# Patient Record
Sex: Female | Born: 1975 | Race: White | Hispanic: No | Marital: Married | Smoking: Never smoker
Health system: Southern US, Community
[De-identification: ages and names within clinical notes are randomized; demographics above are authoritative.]

## PROBLEM LIST (undated history)

## (undated) DIAGNOSIS — M1990 Unspecified osteoarthritis, unspecified site: Secondary | ICD-10-CM

## (undated) DIAGNOSIS — R002 Palpitations: Secondary | ICD-10-CM

## (undated) DIAGNOSIS — I10 Essential (primary) hypertension: Secondary | ICD-10-CM

## (undated) DIAGNOSIS — K219 Gastro-esophageal reflux disease without esophagitis: Secondary | ICD-10-CM

## (undated) DIAGNOSIS — F419 Anxiety disorder, unspecified: Secondary | ICD-10-CM

## (undated) HISTORY — DX: Unspecified osteoarthritis, unspecified site: M19.90

## (undated) HISTORY — PX: IUD REMOVAL: SHX5392

## (undated) HISTORY — DX: Gastro-esophageal reflux disease without esophagitis: K21.9

## (undated) HISTORY — DX: Palpitations: R00.2

## (undated) HISTORY — DX: Essential (primary) hypertension: I10

## (undated) HISTORY — DX: Anxiety disorder, unspecified: F41.9

---

## 2009-12-06 HISTORY — PX: INTRAUTERINE DEVICE INSERTION: SHX323

## 2009-12-06 HISTORY — PX: OTHER SURGICAL HISTORY: SHX169

## 2011-07-28 ENCOUNTER — Inpatient Hospital Stay (INDEPENDENT_AMBULATORY_CARE_PROVIDER_SITE_OTHER)
Admission: RE | Admit: 2011-07-28 | Discharge: 2011-07-28 | Disposition: A | Discharge status: Home / Self Care | Payer: 59 | Source: Ambulatory Visit | Attending: Family Medicine | Admitting: Family Medicine

## 2011-07-28 DIAGNOSIS — J019 Acute sinusitis, unspecified: Secondary | ICD-10-CM

## 2012-06-16 ENCOUNTER — Encounter: Payer: Self-pay | Admitting: Internal Medicine

## 2012-06-16 ENCOUNTER — Ambulatory Visit (INDEPENDENT_AMBULATORY_CARE_PROVIDER_SITE_OTHER): Payer: 59 | Admitting: Internal Medicine

## 2012-06-16 VITALS — BP 122/76 | HR 62 | Temp 98.6°F | Ht 69.0 in | Wt 178.4 lb

## 2012-06-16 DIAGNOSIS — F419 Anxiety disorder, unspecified: Secondary | ICD-10-CM

## 2012-06-16 DIAGNOSIS — Z23 Encounter for immunization: Secondary | ICD-10-CM

## 2012-06-16 DIAGNOSIS — F411 Generalized anxiety disorder: Secondary | ICD-10-CM

## 2012-06-16 DIAGNOSIS — Z0000 Encounter for general adult medical examination without abnormal findings: Secondary | ICD-10-CM

## 2012-06-16 NOTE — Patient Instructions (Signed)
It was good to see you today. Health Maintenance reviewed - all recommended immunizations and age-appropriate screenings are up-to-date. Test(s) ordered today. Your results will be called to you after review (48-72hours after test completion). If any changes need to be made, you will be notified at that time. we'll make referral to [facility] for counseling . Our office will contact you regarding appointment(s) once made. Consider Paxil for anxiety symptoms Please schedule followup in 6 weeks for review, call sooner if problems.

## 2012-06-16 NOTE — Assessment & Plan Note (Signed)
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

## 2012-06-16 NOTE — Progress Notes (Signed)
  Subjective:    Patient ID: Shelby [name], female    DOB: November 22, 1975, 36 y.o.   MRN: [id number]  HPI New patient to me and our practice, here today to establish care patient is here today for annual physical. Patient feels well overall - will return for baseline labs  Also concerned about anxiety symptoms Ongoing symptoms greater than 12 months, worse in past weeks Associated with tightness of chest sensation, difficulty swallowing and emotional liability/frequent tearfulness Initially felt symptoms related to GERD and treated with PPI but unimproved History of same during other stressful periods but never on psychotropic medication or counseling Denies suicidal ideation or homicidal ideation Denies difficulty sleeping Symptoms largely precipitated by stress - strained relationship with mother and ongoing stressors of raising small children - 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 GERD (gastroesophageal reflux disease)    Family History  Problem Relation Age of Onset  . Hyperlipidemia Mother   . Hypertension Mother   . Hypertension Father   . Bipolar disorder Sister     x 2  . Depression Mother    History  Substance Use Topics  . Smoking status: Never Smoker   . Smokeless tobacco: Not on file   Comment: married, lives with spouse (GI doc at [place]) and 2 kids - previous HS history teacher x 66yr  . Alcohol Use: Yes     Review of Systems Constitutional: Negative for fever or weight change.  Respiratory: Negative for cough and shortness of breath.   Cardiovascular: Negative for palpitations.  Gastrointestinal: Negative for abdominal pain, no bowel changes.  Musculoskeletal: Negative for gait problem or joint swelling.  Skin: Negative for  rash.  Neurological: Negative for dizziness or headache.  No other specific complaints in a complete review of systems (except as listed in HPI above).     Objective:   Physical Exam BP 122/76  Pulse 62  Temp 98.6 F (37 C) (Oral)  Ht 5\' 9"  (1.753 m)  Wt 178 lb 6.4 oz (80.922 kg)  BMI 26.35 kg/m2  SpO2 99% Wt Readings from Last 3 Encounters:  06/16/12 178 lb 6.4 oz (80.922 kg)   Constitutional: She appears well-developed and well-nourished. Tearful at times. Episode of "choking" witnessed while discussing her stressors -  Neck: Normal range of motion. Neck supple. No JVD present. No thyromegaly present.  Cardiovascular: Normal rate, regular rhythm and normal heart sounds.  No murmur heard. No BLE edema. Pulmonary/Chest: Effort normal and breath sounds normal. No respiratory distress. She has no wheezes. Neurological: She is alert and oriented to person, place, and time. No cranial nerve deficit. Coordination normal.  Skin: Skin is warm and dry. No rash noted. No erythema.  Psychiatric: She has a dysphoric and anxious mood/affect. Her behavior is normal. Judgment, insight and thought content normal.   No results found for this basename: WBC, HGB, HCT, PLT, GLUCOSE, CHOL, TRIG, HDL, LDLDIRECT, LDLCALC, ALT, AST, NA, K, CL, CREATININE, BUN, CO2, TSH, PSA, INR, GLUF, HGBA1C, MICROALBUR       Assessment & Plan:  CPX/v70.0 - Patient has been counseled on age-appropriate routine health concerns for screening and prevention. These are reviewed and up-to-date. Immunizations are up-to-date or declined. Labs ordered and will be reviewed.  Also See problem list. Medications and labs reviewed today.

## 2012-07-28 ENCOUNTER — Other Ambulatory Visit (INDEPENDENT_AMBULATORY_CARE_PROVIDER_SITE_OTHER): Payer: 59

## 2012-07-28 DIAGNOSIS — Z0000 Encounter for general adult medical examination without abnormal findings: Secondary | ICD-10-CM

## 2012-07-28 LAB — HEPATIC FUNCTION PANEL
Albumin: 3.9 g/dL (ref 3.5–5.2)
Alkaline Phosphatase: 37 U/L — ABNORMAL LOW (ref 39–117)
Bilirubin, Direct: 0.2 mg/dL (ref 0.0–0.3)
Total Bilirubin: 1.2 mg/dL (ref 0.3–1.2)

## 2012-07-28 LAB — CBC WITH DIFFERENTIAL/PLATELET
Basophils Relative: 1.3 % (ref 0.0–3.0)
Eosinophils Absolute: 0.2 10*3/uL (ref 0.0–0.7)
MCHC: 33.7 g/dL (ref 30.0–36.0)
MCV: 95.2 fl (ref 78.0–100.0)
Monocytes Absolute: 0.4 10*3/uL (ref 0.1–1.0)
Neutro Abs: 2.6 10*3/uL (ref 1.4–7.7)
Neutrophils Relative %: 54.5 % (ref 43.0–77.0)
RBC: 4.26 Mil/uL (ref 3.87–5.11)

## 2012-07-28 LAB — URINALYSIS, ROUTINE W REFLEX MICROSCOPIC
Bilirubin Urine: NEGATIVE
Ketones, ur: NEGATIVE
Total Protein, Urine: NEGATIVE
Urine Glucose: NEGATIVE

## 2012-07-28 LAB — LIPID PANEL
LDL Cholesterol: 69 mg/dL (ref 0–99)
Total CHOL/HDL Ratio: 2
Triglycerides: 46 mg/dL (ref 0.0–149.0)

## 2012-07-28 LAB — BASIC METABOLIC PANEL
CO2: 29 mEq/L (ref 19–32)
Chloride: 105 mEq/L (ref 96–112)
Creatinine, Ser: 0.6 mg/dL (ref 0.4–1.2)

## 2012-07-30 ENCOUNTER — Encounter: Payer: Self-pay | Admitting: Internal Medicine

## 2012-07-30 ENCOUNTER — Ambulatory Visit (INDEPENDENT_AMBULATORY_CARE_PROVIDER_SITE_OTHER): Payer: 59 | Admitting: Internal Medicine

## 2012-07-30 VITALS — BP 130/82 | HR 69 | Temp 98.0°F | Ht 69.0 in | Wt 178.0 lb

## 2012-07-30 DIAGNOSIS — F411 Generalized anxiety disorder: Secondary | ICD-10-CM

## 2012-07-30 DIAGNOSIS — M542 Cervicalgia: Secondary | ICD-10-CM

## 2012-07-30 DIAGNOSIS — F419 Anxiety disorder, unspecified: Secondary | ICD-10-CM

## 2012-07-30 DIAGNOSIS — J309 Allergic rhinitis, unspecified: Secondary | ICD-10-CM

## 2012-07-30 MED ORDER — LORATADINE 10 MG PO TABS: 10.0000 mg | ORAL_TABLET | Freq: Every day | ORAL | Status: DC | PRN

## 2012-07-30 NOTE — Progress Notes (Signed)
  Subjective:    Patient ID: Shelby [name], female    DOB: 26-Jan-1976, 36 y.o.   MRN: [id number]  Anxiety     Here for follow up - anxiety symptoms Ongoing symptoms greater than 12 months symptoms associated with tightness of chest sensation, difficulty swallowing and emotional liability/frequent tearfulness -all have improved with counseling therapy History of same during other stressful periods but never on psychotropic medication  counseling begun September 2013, feels improved Denies suicidal ideation or homicidal ideation Denies difficulty sleeping Symptoms largely precipitated by stress - strained relationship with mother and ongoing stressors of raising small children - 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 Tender to touch. Denies fever Associated with increased allergy symptoms including clogged ear sensation and postnasal drip  Past Medical History  Diagnosis Date  . GERD (gastroesophageal reflux disease)   . Anxiety      Review of Systems  Constitutional: Negative for fever or weight change.  Respiratory: Negative for cough and shortness of breath.   Cardiovascular: Negative for palpitations.      Objective:   Physical Exam  BP 130/82  Pulse 69  Temp 98 F (36.7 C) (Oral)  Ht 5\' 9"  (1.753 m)  Wt 178 lb (80.74 kg)  BMI 26.29 kg/m2  SpO2 99%  LMP 07/08/2012 Wt Readings from Last 3 Encounters:  07/30/12 178 lb (80.74 kg)  06/16/12 178 lb 6.4 oz (80.922 kg)   Constitutional: She appears well-developed and well-nourished.  HENT: Normocephalic atraumatic. Sinuses nontender. Ears with clear effusion, no erythema. No cerumen obstruction. Oropharynx clear, scant PND. No exudate - Neck: Tender to  palpation along right sternocleidomastoid -full range of motion, supple. No discrete lymphadenopathy. No thyromegaly or nodules appreciated. Cardiovascular: Normal rate, regular rhythm and normal heart sounds.  No murmur heard. No BLE edema. Pulmonary/Chest: Effort normal and breath sounds normal. No respiratory distress. She has no wheezes. Psychiatric: She has a much brighter mood/affect. Her behavior is normal. Judgment, insight and thought content normal.   Lab Results  Component Value Date   WBC 4.8 07/28/2012   HGB 13.7 07/28/2012   HCT 40.5 07/28/2012   PLT 203.0 07/28/2012   GLUCOSE 86 07/28/2012   CHOL 135 07/28/2012   TRIG 46.0 07/28/2012   HDL 56.60 07/28/2012   LDLCALC 69 07/28/2012   ALT 13 07/28/2012   AST 18 07/28/2012   NA 139 07/28/2012   K 4.6 07/28/2012   CL 105 07/28/2012   CREATININE 0.6 07/28/2012   BUN 11 07/28/2012   CO2 29 07/28/2012   TSH 1.61 07/28/2012      Assessment & Plan:   See problem list. Medications and labs reviewed today.  Right-sided throat pain, associated with soft tissue swelling and with ondynphasia,  no discrete lymphadenopathy or evidence of tonsillar abscess on exam - afebrile and normal white count. Check soft tissue ultrasound, consider musculoskeletal spasm. Continue symptomatically and with over-the-counter ibuprofen and acetaminophen until further imaging complete

## 2012-07-30 NOTE — Assessment & Plan Note (Signed)
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

## 2012-07-30 NOTE — Patient Instructions (Signed)
It was good to see you today. We have reviewed your prior records including labs and tests today - CPX labs are great! we'll make referral for soft tissue ultrasound of your neck . Our office will contact you regarding appointment(s) once made. Your results will be released to MyChart (and/or called to you) after review, usually within 24 hours after test completion. If any changes need to be made, you will be notified at that same time. We'll continue counseling only therapy for your anxiety symptoms at this time. Please call if you or Misty Stanley feel there is a need to try medication therapy Start Claritin daily for allergy symptoms as needed Please schedule followup in 6-12 months, call sooner if problems.

## 2012-07-31 ENCOUNTER — Ambulatory Visit
Admission: RE | Admit: 2012-07-31 | Discharge: 2012-07-31 | Disposition: A | Discharge status: Home / Self Care | Payer: 59 | Source: Ambulatory Visit | Attending: Internal Medicine | Admitting: Internal Medicine

## 2012-07-31 DIAGNOSIS — M542 Cervicalgia: Secondary | ICD-10-CM

## 2013-08-17 ENCOUNTER — Ambulatory Visit: Payer: 59 | Admitting: Physician Assistant

## 2013-08-17 ENCOUNTER — Other Ambulatory Visit (INDEPENDENT_AMBULATORY_CARE_PROVIDER_SITE_OTHER): Payer: 59 | Admitting: Gastroenterology

## 2013-08-17 DIAGNOSIS — Z23 Encounter for immunization: Secondary | ICD-10-CM

## 2013-12-26 ENCOUNTER — Other Ambulatory Visit: Payer: Self-pay | Admitting: Gastroenterology

## 2013-12-26 MED ORDER — ONDANSETRON 4 MG PO TBDP: 4.0000 mg | ORAL_TABLET | Freq: Four times a day (QID) | ORAL | Status: DC | PRN

## 2013-12-26 MED ORDER — HYOSCYAMINE SULFATE 0.125 MG SL SUBL: 0.1250 mg | SUBLINGUAL_TABLET | SUBLINGUAL | Status: DC | PRN

## 2014-07-08 ENCOUNTER — Ambulatory Visit (INDEPENDENT_AMBULATORY_CARE_PROVIDER_SITE_OTHER): Payer: 59 | Admitting: Physician Assistant

## 2014-07-08 ENCOUNTER — Encounter: Payer: Self-pay | Admitting: Physician Assistant

## 2014-07-08 VITALS — BP 120/80 | HR 72 | Temp 98.2°F | Resp 18 | Wt 185.0 lb

## 2014-07-08 DIAGNOSIS — J019 Acute sinusitis, unspecified: Secondary | ICD-10-CM

## 2014-07-08 MED ORDER — AMOXICILLIN-POT CLAVULANATE 875-125 MG PO TABS: 1.0000 | ORAL_TABLET | Freq: Two times a day (BID) | ORAL | Status: DC

## 2014-07-08 NOTE — Progress Notes (Signed)
Pre visit review using our clinic review tool, if applicable. No additional management support is needed unless otherwise documented below in the visit note. 

## 2014-07-08 NOTE — Patient Instructions (Addendum)
Plain Over the Counter Mucinex (NOT Mucinex D) for thick secretions  Force NON dairy fluids, drinking plenty of water is best.    Over the Counter Flonase OR Nasacort AQ 1 spray in each nostril twice a day as needed. Use the "crossover" technique into opposite nostril spraying toward opposite ear @ 45 degree angle, not straight up into nostril.   Plain Over the Counter Allegra (NOT D )  160 daily , OR Loratidine 10 mg , OR Zyrtec 10 mg @ bedtime  as needed for itchy eyes & sneezing.  Saline Irrigation and Saline Sprays can also help reduce symptoms.  Stop using the Afrin after today, as this will be day 3 ..  If you feel as though you are still not improving this weekend despite the above symptomatic treatment, you can fill the prescription I provided. Augmentin, twice daily for 10 days.  If emergency symptoms discussed during visit developed, seek medical attention immediately.  Followup as needed, or for worsening or persistent symptoms despite treatment.     Sinusitis Sinusitis is redness, soreness, and puffiness (inflammation) of the air pockets in the bones of your face (sinuses). The redness, soreness, and puffiness can cause air and mucus to get trapped in your sinuses. This can allow germs to grow and cause an infection.  HOME CARE   Drink enough fluids to keep your pee (urine) clear or pale yellow.  Use a humidifier in your home.  Run a hot shower to create steam in the bathroom. Sit in the bathroom with the door closed. Breathe in the steam 3-4 times a day.  Put a warm, moist washcloth on your face 3-4 times a day, or as told by your doctor.  Use salt water sprays (saline sprays) to wet the thick fluid in your nose. This can help the sinuses drain.  Only take medicine as told by your doctor. GET HELP RIGHT AWAY IF:   Your pain gets worse.  You have very bad headaches.  You are sick to your stomach (nauseous).  You throw up (vomit).  You are very sleepy (drowsy)  all the time.  Your face is puffy (swollen).  Your vision changes.  You have a stiff neck.  You have trouble breathing. MAKE SURE YOU:   Understand these instructions.  Will watch your condition.  Will get help right away if you are not doing well or get worse. Document Released: 03/12/2008 Document Revised: 06/18/2012 Document Reviewed: 04/29/2012 [facility] Patient Information 2015 [place], [place]. This information is not intended to replace advice given to you by your health care provider. Make sure you discuss any questions you have with your health care provider.

## 2014-07-08 NOTE — Progress Notes (Signed)
Subjective:    Patient ID: Shelby [name], female    DOB: [birth year], 38 y.o.   MRN: [id number]  Sinusitis This is a new problem. The current episode started in the past 7 days (8 days). The problem has been gradually worsening since onset. There has been no fever. Her pain is at a severity of 5/10. Associated symptoms include congestion, coughing, headaches, a hoarse voice, sinus pressure (pain in face) and a sore throat. Pertinent negatives include no chills, diaphoresis, ear pain, neck pain, shortness of breath, sneezing or swollen glands. Treatments tried: Afrin, mucinex, ibuprofen, claritin. The treatment provided mild relief.      Review of Systems  Constitutional: Negative for fever, chills and diaphoresis.  HENT: Positive for congestion, hoarse voice, sinus pressure (pain in face) and sore throat. Negative for ear pain and sneezing.   Respiratory: Positive for cough. Negative for shortness of breath.   Cardiovascular: Negative for chest pain.  Gastrointestinal: Positive for diarrhea (earlier in the week, has resolved.). Negative for nausea and vomiting.  Musculoskeletal: Negative for neck pain.  Neurological: Positive for headaches. Negative for syncope.  All other systems reviewed and are negative.    Past Medical History  Diagnosis Date  . GERD (gastroesophageal reflux disease)   . Anxiety     History   Social History  . Marital Status: Married    Spouse Name: N/A    Number of Children: N/A  . Years of Education: N/A   Occupational History  . Not on file.   Social History Main Topics  . Smoking status: Never Smoker   . 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 Alcohol Use: Yes  . Drug Use: No  . Sexual Activity: Not on file   Other Topics Concern  . Not on file   Social History Narrative  . No narrative on file    Past Surgical History  Procedure Laterality  Date  . Cyst remove  12/2009  . Intrauterine device insertion  12/2009    Family History  Problem Relation Age of Onset  . Hyperlipidemia Mother   . Hypertension Mother   . Hypertension Father   . Bipolar disorder Sister     x 2  . Depression Mother     No Known Allergies  Current Outpatient Prescriptions on File Prior to Visit  Medication Sig Dispense Refill  . hyoscyamine (LEVSIN SL) 0.125 MG SL tablet Place 1 tablet (0.125 mg total) under the tongue every 4 (four) hours as needed.  30 tablet  1  . levonorgestrel (MIRENA) 20 MCG/24HR IUD 1 Intra Uterine Device (1 each total) by Intrauterine route once.  1 each  0  . loratadine (CLARITIN) 10 MG tablet Take 1 tablet (10 mg total) by mouth daily as needed for allergies.  30 tablet  2  . ondansetron (ZOFRAN ODT) 4 MG disintegrating tablet Take 1 tablet (4 mg total) by mouth every 6 (six) hours as needed for nausea or vomiting.  30 tablet  1   No current facility-administered medications on file prior to visit.    EXAM: BP 120/80  Pulse 72  Temp(Src) 98.2 F (36.8 C) (Oral)  Resp 18  Wt 185 lb (83.915 kg)     Objective:   Physical Exam  Nursing note and vitals reviewed. Constitutional: She is oriented to person, place, and time. She appears well-developed and  well-nourished. No distress.  HENT:  Head: Normocephalic and atraumatic.  Right Ear: External ear normal.  Left Ear: External ear normal.  Nose: Nose normal.  Mouth/Throat: No oropharyngeal exudate.  Oropharynx is slightly erythematous, no exudate. Bilateral TMs normal. Bilateral frontal sinuses non-TTP. Bilat max sinuses ttp.  Eyes: Conjunctivae and EOM are normal. Pupils are equal, round, and reactive to light.  Neck: Normal range of motion. Neck supple.  Cardiovascular: Normal rate, regular rhythm and intact distal pulses.   Pulmonary/Chest: Effort normal and breath sounds normal. No stridor. No respiratory distress. She has no wheezes. She has no rales. She  exhibits no tenderness.  Lymphadenopathy:    She has no cervical adenopathy.  Neurological: She is alert and oriented to person, place, and time.  Skin: Skin is warm and dry. No rash noted. She is not diaphoretic. No erythema. No pallor.  Psychiatric: She has a normal mood and affect. Her behavior is normal. Judgment and thought content normal.     Lab Results  Component Value Date   WBC 4.8 07/28/2012   HGB 13.7 07/28/2012   HCT 40.5 07/28/2012   PLT 203.0 07/28/2012   GLUCOSE 86 07/28/2012   CHOL 135 07/28/2012   TRIG 46.0 07/28/2012   HDL 56.60 07/28/2012   LDLCALC 69 07/28/2012   ALT 13 07/28/2012   AST 18 07/28/2012   NA 139 07/28/2012   K 4.6 07/28/2012   CL 105 07/28/2012   CREATININE 0.6 07/28/2012   BUN 11 07/28/2012   CO2 29 07/28/2012   TSH 1.61 07/28/2012        Assessment & Plan:  Francille was seen today for sinusitis.  Diagnoses and associated orders for this visit:  Acute sinusitis, recurrence not specified, unspecified location Comments: Continue symptomatic treatment with OTC mucinex, nasal steroid, antihistamine, rest push fluids, watchful waiting. - amoxicillin-clavulanate (AUGMENTIN) 875-125 MG per tablet; Take 1 tablet by mouth 2 (two) times daily.    Discussed using abx with pt, and that she may not need abx at this time. The pt states that she does not want to take abx if she does not need them, however is concerned that she is still no better despite at home symptom treatment. Discussed providing a printed Rx for Augmentin for pt to fill if she is still not any better with symptomatic therapy this weekend. She is amenable to this plan.  Return precautions provided, and patient handout on sinusitis.  Plan to follow up as needed, or for worsening or persistent symptoms despite treatment.  Patient Instructions  Plain Over the Counter Mucinex (NOT Mucinex D) for thick secretions  Force NON dairy fluids, drinking plenty of water is best.    Over  the Counter Flonase OR Nasacort AQ 1 spray in each nostril twice a day as needed. Use the "crossover" technique into opposite nostril spraying toward opposite ear @ 45 degree angle, not straight up into nostril.   Plain Over the Counter Allegra (NOT D )  160 daily , OR Loratidine 10 mg , OR Zyrtec 10 mg @ bedtime  as needed for itchy eyes & sneezing.  Saline Irrigation and Saline Sprays can also help reduce symptoms.  Stop using the Afrin after today, as this will be day 3 ..  If you feel as though you are still not improving this weekend despite the above symptomatic treatment, you can fill the prescription I provided. Augmentin, twice daily for 10 days.  If emergency symptoms discussed during visit developed, seek medical attention  immediately.  Followup as needed, or for worsening or persistent symptoms despite treatment.

## 2015-03-28 ENCOUNTER — Ambulatory Visit (INDEPENDENT_AMBULATORY_CARE_PROVIDER_SITE_OTHER)
Admission: RE | Admit: 2015-03-28 | Discharge: 2015-03-28 | Disposition: A | Discharge status: Home / Self Care | Payer: 59 | Source: Ambulatory Visit | Attending: Family Medicine | Admitting: Family Medicine

## 2015-03-28 ENCOUNTER — Encounter: Payer: Self-pay | Admitting: Family Medicine

## 2015-03-28 ENCOUNTER — Ambulatory Visit (INDEPENDENT_AMBULATORY_CARE_PROVIDER_SITE_OTHER): Payer: 59 | Admitting: Family Medicine

## 2015-03-28 VITALS — BP 122/76 | HR 73 | Ht 69.0 in

## 2015-03-28 DIAGNOSIS — M549 Dorsalgia, unspecified: Secondary | ICD-10-CM

## 2015-03-28 DIAGNOSIS — M5416 Radiculopathy, lumbar region: Secondary | ICD-10-CM

## 2015-03-28 MED ORDER — GABAPENTIN 100 MG PO CAPS: 100.0000 mg | ORAL_CAPSULE | Freq: Every day | ORAL | Status: DC

## 2015-03-28 MED ORDER — KETOROLAC TROMETHAMINE 60 MG/2ML IM SOLN
60.0000 mg | Freq: Once | INTRAMUSCULAR | Status: AC
  Administered 2015-03-28: 60 mg via INTRAMUSCULAR

## 2015-03-28 MED ORDER — PREDNISONE 50 MG PO TABS: 50.0000 mg | ORAL_TABLET | Freq: Every day | ORAL | Status: DC

## 2015-03-28 MED ORDER — METHYLPREDNISOLONE ACETATE 80 MG/ML IJ SUSP
80.0000 mg | Freq: Once | INTRAMUSCULAR | Status: AC
  Administered 2015-03-28: 80 mg via INTRAMUSCULAR

## 2015-03-28 MED ORDER — TRAMADOL HCL 50 MG PO TABS: 50.0000 mg | ORAL_TABLET | Freq: Two times a day (BID) | ORAL | Status: DC | PRN

## 2015-03-28 NOTE — Progress Notes (Signed)
Pre visit review using our clinic review tool, if applicable. No additional management support is needed unless otherwise documented below in the visit note. 

## 2015-03-28 NOTE — Patient Instructions (Addendum)
Good to see you.  Great to meet Jay's better half.  2 injections today Ice 20 minutes 2 times daily. Usually after activity and before bed. Prednisone 50 mg daily for 5 days Gabapentin 100mg  nightly Avoid excessive flexion or overhead lifting.  See you next week.

## 2015-03-28 NOTE — Assessment & Plan Note (Signed)
She does have an L5-S1 nerve roots impingement on the right side. We discussed different treatment options and patient is coming like to try conservative therapy. Patient given prednisone, gabapentin, tramadol for breakthrough pain. Patient has muscle relaxer if needed. We discussed icing regimen. We will hold on home exercises until patient follows up again in 1 week for further evaluation and treatment.

## 2015-03-28 NOTE — Progress Notes (Signed)
[name] [place], [place] [address] Phone: [phone] Subjective:    I'm seeing this patient by the request  of:  Gwendolyn Grant, MD  CC: back pain.   Shelby [name] is a 39 y.o. female coming in with complaint of back pain. Patient states that this has been more back pain. Patient started running back in February and has had intermittent times where she has spasming of her low back. Patient states that unfortunately starting 48 hours ago she said severe pain. Patient severe pain seemed to have some mild radiation down the posterior aspect of her leg. Some mild numbness on the left lateral toes as well. Patient describes the pain as a unrelenting pain that seems to be worse with going from a seated to standing position and laid into a sitting position. Patient rates the severity of pain is 10 out of 10. Denies any bowel or bladder incontinence medical history on a pot secondary to the pain. Patient denies any weakness of the lower extremity. Patient has tried taking a muscle relaxer without any significant benefit. Patient states the muscle relaxer use to help. Patient is also taking the ibuprofen 800 mg previously.     Past Medical History  Diagnosis Date  . GERD (gastroesophageal reflux disease)   . Anxiety    Past Surgical History  Procedure Laterality Date  . Cyst remove  12/2009  . Intrauterine device insertion  12/2009   History  Substance Use Topics  . Smoking status: Never Smoker   . 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 Alcohol Use: Yes   No Known Allergies Family History  Problem Relation Age of Onset  . Hyperlipidemia Mother   . Hypertension Mother   . Hypertension Father   . Bipolar disorder Sister     x 2  . Depression Mother    '  Past medical history, social, surgical and family history all reviewed  in electronic medical record.   Review of Systems: No headache, visual changes, nausea, vomiting, diarrhea, constipation, dizziness, abdominal pain, skin rash, fevers, chills, night sweats, weight loss, swollen lymph nodes, body aches, joint swelling, muscle aches, chest pain, shortness of breath, mood changes.   Objective Blood pressure 122/76, pulse 73, height 5\' 9"  (1.753 m), SpO2 94 %.  General: patient is very uncomfortable HEENT: Pupils equal, extraocular movements intact  Respiratory: Patient's speak in full sentences and does not appear short of breath  Cardiovascular: No lower extremity edema, non tender, no erythema  Skin: Warm dry intact with no signs of infection or rash on extremities or on axial skeleton.  Abdomen: Soft nontender  Neuro: Cranial nerves II through XII are intact, neurovascularly intact in all extremities with 2+ DTRs and 2+ pulses.  Lymph: No lymphadenopathy of posterior or anterior cervical chain or axillae bilaterally.  Gait normal with good balance and coordination.  MSK:  Non tender with full range of motion and good stability and symmetric strength and tone of shoulders, elbows, wrist, hip, knee and ankles bilaterally.  Back Exam:  Inspection: Unremarkable  Motion: Flexion 25 deg, Extension 25 deg, Side Bending to 35 deg bilaterally,  Rotation to 45 deg bilaterally  SLR laying: POSITIVE RIGHTL5-S1 nerve root distribution XSLR laying: POSITIVE RIGHT Palpable tenderness: TENDERNESS OVER THE l5-s1 AREA ON RIGHT SIDE MILD TENDERNESS OVER PIRIFORMIS. FABER:  negative. Sensory change: Gross sensation intact to all lumbar and sacral dermatomes.  Reflexes: 2+ at both patellar tendons, 2+ at achilles tendons, Babinski's downgoing.  Strength at foot  Plantar-flexion: 5/5 Dorsi-flexion: 5/5 Eversion: 5/5 Inversion: 5/5  Leg strength  Quad: 4/5 Hamstring: 4/5 Hip flexor: 4/5 Hip abductors: 3/5  Gait unremarkable.    Impression and Recommendations:     This  case required medical decision making of moderate complexity.

## 2015-03-28 NOTE — Addendum Note (Signed)
Addended by: Douglass Rivers T on: 03/28/2015 01:50 PM   Modules accepted: Orders

## 2015-04-04 ENCOUNTER — Encounter: Payer: Self-pay | Admitting: Family Medicine

## 2015-04-04 ENCOUNTER — Ambulatory Visit (INDEPENDENT_AMBULATORY_CARE_PROVIDER_SITE_OTHER): Payer: 59 | Admitting: Family Medicine

## 2015-04-04 VITALS — BP 102/74 | HR 67 | Ht 69.0 in | Wt 183.0 lb

## 2015-04-04 DIAGNOSIS — M5416 Radiculopathy, lumbar region: Secondary | ICD-10-CM

## 2015-04-04 NOTE — Patient Instructions (Signed)
Good to see you Ice 20 minutes 2 times daily. Usually after activity and before bed. New exercises Ibuprofen 600mg  3 times a day for 3 days Yoga 1-2 times a week.  Husband has to do all chores including laundry :) See me again in 2 weeks and if not perfect lets consider manipulation.

## 2015-04-04 NOTE — Assessment & Plan Note (Signed)
Patient is slowly improving at this time. Encourage her to start the home exercises and patient did work with Product/process development scientist today. We discussed icing regimen. Patient will continue most of the same regimen and increase her activity as tolerated. Patient and will come back and see me again in 3 weeks. May want to consider osteopathic manipulation the patient declined today.

## 2015-04-04 NOTE — Progress Notes (Signed)
Pre visit review using our clinic review tool, if applicable. No additional management support is needed unless otherwise documented below in the visit note. 

## 2015-04-04 NOTE — Progress Notes (Signed)
[name] [name], [place] [address] Phone: [phone] Subjective:    I'm seeing this patient by the request  of:  Gwendolyn Grant, MD  CC: back pain follow-up.   [id number] [place] [name] is a 39 y.o. female coming in with complaint of back pain. Patient was seen previously and had a concern for herniated disc of the lumbar spine. Patient was given prednisone, gabapentin, as well as tramadol for breakthrough pain. Patient went to the gabapentin 1 time but did take the prednisone. Patient has not taken any anti-inflammatory's and has not taken any of them pain medication. Patient states that she is 80% better but still has soreness. Patient states that it is still slowing her down somewhat but not as severe as what it was previously. Improvement.     Past Medical History  Diagnosis Date  . GERD (gastroesophageal reflux disease)   . Anxiety    Past Surgical History  Procedure Laterality Date  . Cyst remove  12/2009  . Intrauterine device insertion  12/2009   History  Substance Use Topics  . Smoking status: Never Smoker   . Smokeless tobacco: Not on file     Comment: married, lives with spouse (GI doc at [facility]) and 2 kids - previous HS history teacher x 23yr  . Alcohol Use: Yes   No Known Allergies Family History  Problem Relation Age of Onset  . Hyperlipidemia Mother   . Hypertension Mother   . Hypertension Father   . Bipolar disorder Sister     x 2  . Depression Mother    '  Past medical history, social, surgical and family history all reviewed in electronic medical record.   Review of Systems: No headache, visual changes, nausea, vomiting, diarrhea, constipation, dizziness, abdominal pain, skin rash, fevers, chills, night sweats, weight loss, swollen lymph nodes, body aches, joint swelling, muscle aches, chest pain, shortness of breath, mood changes.   Objective Blood pressure 102/74, pulse 67, height 5\' 9"  (1.753 m),  weight 183 lb (83.008 kg), last menstrual period 03/14/2015, SpO2 98 %.  General: patient is very uncomfortable HEENT: Pupils equal, extraocular movements intact  Respiratory: Patient's speak in full sentences and does not appear short of breath  Cardiovascular: No lower extremity edema, non tender, no erythema  Skin: Warm dry intact with no signs of infection or rash on extremities or on axial skeleton.  Abdomen: Soft nontender  Neuro: Cranial nerves II through XII are intact, neurovascularly intact in all extremities with 2+ DTRs and 2+ pulses.  Lymph: No lymphadenopathy of posterior or anterior cervical chain or axillae bilaterally.  Gait normal with good balance and coordination.  MSK:  Non tender with full range of motion and good stability and symmetric strength and tone of shoulders, elbows, wrist, hip, knee and ankles bilaterally.  Back Exam:  Inspection: Unremarkable  Motion: Flexion 25 deg, Extension 25 deg, Side Bending to 35 deg bilaterally,  Rotation to 45 deg bilaterally  SLR laying: Negative XSLR laying: Negative Palpable tenderness: TENDERNESS OVER THE l5-s1 AREA ON RIGHT SIDE MILD TENDERNESS OVER PIRIFORMIS still present FABER: negative. Sensory change: Gross sensation intact to all lumbar and sacral dermatomes.  Reflexes: 2+ at both patellar tendons, 2+ at achilles tendons, Babinski's downgoing.  Strength at foot  Plantar-flexion: 5/5 Dorsi-flexion: 5/5 Eversion: 5/5 Inversion: 5/5  Leg strength  Quad: 4/5 Hamstring: 4/5 Hip flexor: 4/5 Hip abductors: 3/5  Gait unremarkable.  Procedure note 16010; 15 minutes spent for  Therapeutic exercises as stated in above notes.  This included exercises focusing on stretching, strengthening, with significant focus on eccentric aspects.  Low back exercises that included:  Pelvic tilt/bracing instruction to focus on control of the pelvic girdle and lower abdominal muscles  Glute strengthening exercises, focusing on proper firing of  the glutes without engaging the low back muscles Proper stretching techniques for maximum relief for the hamstrings, hip flexors, low back and some rotation where tolerated Proper technique shown and discussed handout in great detail with ATC.  All questions were discussed and answered.     Impression and Recommendations:     This case required medical decision making of moderate complexity.

## 2015-04-18 ENCOUNTER — Ambulatory Visit (INDEPENDENT_AMBULATORY_CARE_PROVIDER_SITE_OTHER): Payer: 59 | Admitting: Family Medicine

## 2015-04-18 ENCOUNTER — Encounter: Payer: Self-pay | Admitting: Family Medicine

## 2015-04-18 VITALS — BP 120/82 | HR 68 | Ht 69.0 in | Wt 183.0 lb

## 2015-04-18 DIAGNOSIS — M5416 Radiculopathy, lumbar region: Secondary | ICD-10-CM

## 2015-04-18 NOTE — Progress Notes (Signed)
[name] [place], [place] [address] Phone: [phone] Subjective:    I'm seeing this patient by the request  of:  Gwendolyn Grant, MD  CC: back pain follow-up.   [id number] [place] [name] is a 39 y.o. female coming in with complaint of back pain. Patient was seen previously and had a concern for herniated disc of the lumbar spine. Patient was given prednisone, gabapentin, as well as tramadol for breakthrough pain. Patient has not needed any anti-inflammatory that the splint is doing much better. Patient states she is doing significantly better. Patient states that she is back to her baseline. Patient has not taken any medications and is doing yoga on a regular basis.  Past Medical History  Diagnosis Date  . GERD (gastroesophageal reflux disease)   . Anxiety   t her back pain is really nonexistent at this time. Patient has     Past Medical History  Diagnosis Date  . GERD (gastroesophageal reflux disease)   . Anxiety    Past Surgical History  Procedure Laterality Date  . Cyst remove  12/2009  . Intrauterine device insertion  12/2009   History  Substance Use Topics  . Smoking status: Never Smoker   . 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 Alcohol Use: Yes   No Known Allergies Family History  Problem Relation Age of Onset  . Hyperlipidemia Mother   . Hypertension Mother   . Hypertension Father   . Bipolar disorder Sister     x 2  . Depression Mother    '  Past medical history, social, surgical and family history all reviewed in electronic medical record.   Review of Systems: No headache, visual changes, nausea, vomiting, diarrhea, constipation, dizziness, abdominal pain, skin rash, fevers, chills, night sweats, weight loss, swollen lymph nodes, body aches, joint swelling, muscle aches, chest pain, shortness of breath, mood  changes.   Objective Blood pressure 120/82, pulse 68, height 5\' 9"  (1.753 m), weight 183 lb (83.008 kg), last menstrual period 03/14/2015, SpO2 95 %.  General: patient is very uncomfortable HEENT: Pupils equal, extraocular movements intact  Respiratory: Patient's speak in full sentences and does not appear short of breath  Cardiovascular: No lower extremity edema, non tender, no erythema  Skin: Warm dry intact with no signs of infection or rash on extremities or on axial skeleton.  Abdomen: Soft nontender  Neuro: Cranial nerves II through XII are intact, neurovascularly intact in all extremities with 2+ DTRs and 2+ pulses.  Lymph: No lymphadenopathy of posterior or anterior cervical chain or axillae bilaterally.  Gait normal with good balance and coordination.  MSK:  Non tender with full range of motion and good stability and symmetric strength and tone of shoulders, elbows, wrist, hip, knee and ankles bilaterally.  Back Exam:  Inspection: Unremarkable  Motion: Flexion 25 deg, Extension 25 deg, Side Bending to 35 deg bilaterally,  Rotation to 45 deg bilaterally  SLR laying: Negative XSLR laying: Negative Palpable tenderness: Continued improvement with minimal's pain right. FABER: negative. Sensory change: Gross sensation intact to all lumbar and sacral dermatomes.  Reflexes: 2+ at both patellar tendons, 2+ at achilles tendons, Babinski's downgoing.  Strength at foot  Plantar-flexion: 5/5 Dorsi-flexion: 5/5 Eversion: 5/5 Inversion: 5/5  Leg strength  Quad: 4/5 Hamstring: 4/5 Hip flexor: 5/5 Hip abductors: 4/5  Gait unremarkable.  Impression and Recommendations:     This case required medical decision making of moderate complexity.

## 2015-04-18 NOTE — Patient Instructions (Signed)
Good to see you as always.  Ice when you need it.  Continue to increase activity Tennis ok 1-2 times a week then go to town Work on core strength and hip abductor strength still  See you around the neighborhood.

## 2015-04-18 NOTE — Assessment & Plan Note (Signed)
Patient's longer having an exacerbation. Patient has not taken any pain medications at this time. We discussed icing regimen as well as continuing to increase her activity. Patient was given an exercise prescription in. Patient will follow-up as needed.

## 2015-04-18 NOTE — Progress Notes (Signed)
Pre visit review using our clinic review tool, if applicable. No additional management support is needed unless otherwise documented below in the visit note. 

## 2015-06-09 ENCOUNTER — Encounter: Payer: Self-pay | Admitting: Family Medicine

## 2015-06-15 ENCOUNTER — Ambulatory Visit (INDEPENDENT_AMBULATORY_CARE_PROVIDER_SITE_OTHER): Payer: 59 | Admitting: Family Medicine

## 2015-06-15 ENCOUNTER — Encounter: Payer: Self-pay | Admitting: Family Medicine

## 2015-06-15 VITALS — BP 122/78 | HR 75 | Ht 69.0 in | Wt 186.0 lb

## 2015-06-15 DIAGNOSIS — M9902 Segmental and somatic dysfunction of thoracic region: Secondary | ICD-10-CM

## 2015-06-15 DIAGNOSIS — M9903 Segmental and somatic dysfunction of lumbar region: Secondary | ICD-10-CM

## 2015-06-15 DIAGNOSIS — M999 Biomechanical lesion, unspecified: Secondary | ICD-10-CM | POA: Insufficient documentation

## 2015-06-15 DIAGNOSIS — M5416 Radiculopathy, lumbar region: Secondary | ICD-10-CM | POA: Diagnosis not present

## 2015-06-15 DIAGNOSIS — M9904 Segmental and somatic dysfunction of sacral region: Secondary | ICD-10-CM

## 2015-06-15 NOTE — Progress Notes (Signed)
Pre visit review using our clinic review tool, if applicable. No additional management support is needed unless otherwise documented below in the visit note. 

## 2015-06-15 NOTE — Progress Notes (Signed)
[name] [place], [place] [address] Phone: [phone] Subjective:    CC: back pain follow-up.   [id number] [place] [name] is a 39 y.o. female coming in with complaint of back pain.patient did have an acute lumbar radiculopathy multiple months ago. Patient is stating that she is unfortunately having worsening pain again. States that from time to time she does have a radicular symptom but nothing that is constant. Denies any continuing numbness or weakness. Patient states though that certain activities seem to make it significantly worse. Especially pushing. Seems to be located in the lower spine but does go into the buttocks again.patient rates the severity of pain a 6 out of 10 because it is keeping him from her daily activities.  Past Medical History  Diagnosis Date  . GERD (gastroesophageal reflux disease)   . Anxiety       Past Medical History  Diagnosis Date  . GERD (gastroesophageal reflux disease)   . Anxiety    Past Surgical History  Procedure Laterality Date  . Cyst remove  12/2009  . Intrauterine device insertion  12/2009   Social History  Substance Use Topics  . Smoking status: Never Smoker   . 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 Alcohol Use: Yes   No Known Allergies Family History  Problem Relation Age of Onset  . Hyperlipidemia Mother   . Hypertension Mother   . Hypertension Father   . Bipolar disorder Sister     x 2  . Depression Mother    '  Past medical history, social, surgical and family history all reviewed in electronic medical record.   Review of Systems: No headache, visual changes, nausea, vomiting, diarrhea, constipation, dizziness, abdominal pain, skin rash, fevers, chills, night sweats, weight loss, swollen lymph nodes, body aches, joint swelling, muscle aches, chest pain, shortness of breath, mood  changes.   Objective Blood pressure 122/78, pulse 75, height 5\' 9"  (1.753 m), weight 186 lb (84.369 kg), SpO2 96 %.  General: patient is very uncomfortable HEENT: Pupils equal, extraocular movements intact  Respiratory: Patient's speak in full sentences and does not appear short of breath  Cardiovascular: No lower extremity edema, non tender, no erythema  Skin: Warm dry intact with no signs of infection or rash on extremities or on axial skeleton.  Abdomen: Soft nontender  Neuro: Cranial nerves II through XII are intact, neurovascularly intact in all extremities with 2+ DTRs and 2+ pulses.  Lymph: No lymphadenopathy of posterior or anterior cervical chain or axillae bilaterally.  Gait normal with good balance and coordination.  MSK:  Non tender with full range of motion and good stability and symmetric strength and tone of shoulders, elbows, wrist, hip, knee and ankles bilaterally.  Back Exam:  Inspection: Unremarkable  Motion: Flexion 25 deg, Extension 25 deg, Side Bending to 35 deg bilaterally,  Rotation to 45 deg bilaterally  SLR laying: Negativethe patient states that she has noticed tightness in the buttocks regionXSLR laying: Negative Palpable tenderness: negative pain in the paraspinal musculature and the right sacroiliac joint FABER: negative. Sensory change: Gross sensation intact to all lumbar and sacral dermatomes.  Reflexes: 2+ at both patellar tendons, 2+ at achilles tendons, Babinski's downgoing.  Strength at foot  Plantar-flexion: 5/5 Dorsi-flexion: 5/5 Eversion: 5/5 Inversion: 5/5  Leg strength  Quad: 5/5 Hamstring: 4/5still weak compared to previous  exam Hip flexor: 5/5 Hip abductors: 4/5  Gait unremarkable.  Osteopathic findings Sacrum right on right Lumbar L2 flexed rotated and side bent left T5 extended rotated and side bent right    Impression and Recommendations:     This case required medical decision making of moderate complexity.

## 2015-06-15 NOTE — Patient Instructions (Signed)
Good to see you Your sacrum was rotated again Exercises on wall.  Heel and butt touching.  Raise leg 6 inches and hold 2 seconds.  Down slow for count of 4 seconds.  1 set of 30 reps daily on both sides.  Continue the stretches of the piriformis 20-3 times a week.  Gabapentin at night could help Sit on tennis ball with sitting for long amount of time duexis 3 times daily for 6 days See me again in 2 weeks.

## 2015-06-15 NOTE — Assessment & Plan Note (Signed)
Decision today to treat with OMT was based on Physical Exam  After verbal consent patient was treated with HVLA, ME, FPR techniques in  thoracic, lumbar and sacral areas  Patient tolerated the procedure well with improvement in symptoms  Patient given exercises, stretches and lifestyle modifications  See medications in patient instructions if given  Patient will follow up in 2-3 weeks 

## 2015-06-15 NOTE — Assessment & Plan Note (Signed)
Concern the patient is working on possibly a new herniated disc at this time. Or recurrent herniated disc. Discussed with patient at great length. I like to avoid any prednisone. Patient given some anti-inflammatory to try as well as an icing protocol. We discussed the possibility of formal physical therapy but we will consider this in the future. Patient will continue with the home exercises.we attempted some osteopathic manipulation and patient did respond very well. Over this will be beneficial in patient may need to have this intermittently. Patient come back in 2-3 weeks for further evaluation and if any worsening symptoms advance imaging may be warranted.

## 2015-06-27 ENCOUNTER — Encounter: Payer: Self-pay | Admitting: Family Medicine

## 2015-07-04 ENCOUNTER — Encounter: Payer: Self-pay | Admitting: Family Medicine

## 2015-07-04 ENCOUNTER — Ambulatory Visit (INDEPENDENT_AMBULATORY_CARE_PROVIDER_SITE_OTHER): Payer: 59 | Admitting: Family Medicine

## 2015-07-04 VITALS — BP 122/82 | HR 54 | Ht 69.0 in | Wt 186.0 lb

## 2015-07-04 DIAGNOSIS — M9904 Segmental and somatic dysfunction of sacral region: Secondary | ICD-10-CM | POA: Diagnosis not present

## 2015-07-04 DIAGNOSIS — M9903 Segmental and somatic dysfunction of lumbar region: Secondary | ICD-10-CM

## 2015-07-04 DIAGNOSIS — M533 Sacrococcygeal disorders, not elsewhere classified: Secondary | ICD-10-CM | POA: Insufficient documentation

## 2015-07-04 DIAGNOSIS — M9902 Segmental and somatic dysfunction of thoracic region: Secondary | ICD-10-CM | POA: Diagnosis not present

## 2015-07-04 DIAGNOSIS — M999 Biomechanical lesion, unspecified: Secondary | ICD-10-CM

## 2015-07-04 NOTE — Assessment & Plan Note (Signed)
Decision today to treat with OMT was based on Physical Exam  After verbal consent patient was treated with HVLA, ME, FPR techniques in  thoracic, lumbar and sacral areas  Patient tolerated the procedure well with improvement in symptoms  Patient given exercises, stretches and lifestyle modifications  See medications in patient instructions if given  Patient will follow up in 3 weeks 

## 2015-07-04 NOTE — Progress Notes (Signed)
[name] [place], [place] [address] Phone: [phone] Subjective:    CC: back pain follow-up.   [id number] [place] [name] is a 39 y.o. female coming in with complaint of back pain.patient did have an acute lumbar radiculopathy multiple months ago. Patient is stating that she is unfortunately having worsening pain again. Patient's states that she was playing tennis he of the day and had excruciating pain than construct not be able to ambulate. This happen one week ago.  Patient did take anti-inflammatory,  states that it is improving. More tightness again around the sacroiliac joint. States that she can do all other activities. States that from time to time getting up and down seems to be a little bit uncomfortable. Patient did not have any radicular symptoms on the leg or any weakness.   Past Medical History  Diagnosis Date  . GERD (gastroesophageal reflux disease)   . Anxiety       Past Medical History  Diagnosis Date  . GERD (gastroesophageal reflux disease)   . Anxiety    Past Surgical History  Procedure Laterality Date  . Cyst remove  12/2009  . Intrauterine device insertion  12/2009   Social History  Substance Use Topics  . Smoking status: Never Smoker   . 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 Alcohol Use: Yes   No Known Allergies Family History  Problem Relation Age of Onset  . Hyperlipidemia Mother   . Hypertension Mother   . Hypertension Father   . Bipolar disorder Sister     x 2  . Depression Mother    '  Past medical history, social, surgical and family history all reviewed in electronic medical record.   Review of Systems: No headache, visual changes, nausea, vomiting, diarrhea, constipation, dizziness, abdominal pain, skin rash, fevers, chills, night sweats, weight loss, swollen lymph nodes, body aches, joint  swelling, muscle aches, chest pain, shortness of breath, mood changes.   Objective Blood pressure 122/82, pulse 54, height 5\' 9"  (1.753 m), weight 186 lb (84.369 kg), SpO2 97 %.  General: patient is very uncomfortable HEENT: Pupils equal, extraocular movements intact  Respiratory: Patient's speak in full sentences and does not appear short of breath  Cardiovascular: No lower extremity edema, non tender, no erythema  Skin: Warm dry intact with no signs of infection or rash on extremities or on axial skeleton.  Abdomen: Soft nontender  Neuro: Cranial nerves II through XII are intact, neurovascularly intact in all extremities with 2+ DTRs and 2+ pulses.  Lymph: No lymphadenopathy of posterior or anterior cervical chain or axillae bilaterally.  Gait normal with good balance and coordination.  MSK:  Non tender with full range of motion and good stability and symmetric strength and tone of shoulders, elbows, wrist, hip, knee and ankles bilaterally.  Back Exam:  Inspection: Unremarkable  Motion: Flexion 25 deg, Extension 25 deg, Side Bending to 35 deg bilaterally,  Rotation to 45 deg bilaterally  SLR laying: Negative straight leg test  XSLR laying: Negative Palpable tenderness: Continued stiffness over the right sacroiliac joint FABER: negative. Sensory change: Gross sensation intact to all lumbar and sacral dermatomes.  Reflexes: 2+ at both patellar tendons, 2+ at achilles tendons, Babinski's downgoing.  Strength at foot  Plantar-flexion: 5/5 Dorsi-flexion: 5/5 Eversion: 5/5 Inversion: 5/5  Leg strength  Quad: 5/5 Hamstring: 4/5still weak  compared to previous exam Hip flexor: 5/5 Hip abductors: 4/5  Gait unremarkable.  Osteopathic findings Sacrum right on right Lumbar L2 flexed rotated and side bent left T5 extended rotated and side bent right    Impression and Recommendations:     This case required medical decision making of moderate complexity.

## 2015-07-04 NOTE — Progress Notes (Signed)
Pre visit review using our clinic review tool, if applicable. No additional management support is needed unless otherwise documented below in the visit note. 

## 2015-07-04 NOTE — Patient Instructions (Signed)
Get back the horse.  Ice after activity  Continue on the hip abductors.  Vitamin D 2000 IU daily Turmeric 500mg  twice daily Tart cherry extract at night Lets try a message  See me again in 3 weeks.

## 2015-07-04 NOTE — Assessment & Plan Note (Signed)
She likely has more of a sacroiliac joint dysfunction. We discussed icing regimen and home exercises. We discussed which activities to do a and which ones to avoid. We discussed the importance of hip abductor strengthening. Patient will try to make these different changes and come back and see me again in 3-4 weeks. Patient given suggestions of over-the-counter natural supplements to decrease inflammation as well.

## 2015-07-25 ENCOUNTER — Ambulatory Visit (INDEPENDENT_AMBULATORY_CARE_PROVIDER_SITE_OTHER): Payer: 59 | Admitting: Family Medicine

## 2015-07-25 ENCOUNTER — Encounter: Payer: Self-pay | Admitting: Family Medicine

## 2015-07-25 VITALS — BP 112/82 | HR 72 | Ht 69.0 in | Wt 181.0 lb

## 2015-07-25 DIAGNOSIS — M9902 Segmental and somatic dysfunction of thoracic region: Secondary | ICD-10-CM

## 2015-07-25 DIAGNOSIS — M999 Biomechanical lesion, unspecified: Secondary | ICD-10-CM

## 2015-07-25 DIAGNOSIS — M9904 Segmental and somatic dysfunction of sacral region: Secondary | ICD-10-CM | POA: Diagnosis not present

## 2015-07-25 DIAGNOSIS — M9903 Segmental and somatic dysfunction of lumbar region: Secondary | ICD-10-CM

## 2015-07-25 DIAGNOSIS — M533 Sacrococcygeal disorders, not elsewhere classified: Secondary | ICD-10-CM

## 2015-07-25 NOTE — Assessment & Plan Note (Signed)
Patient is doing remarkably well at this time. Encourage her to continue the home exercises in the icing protocol. Patient is going to continue the vitamin supplementation. We discussed which activities and yoga would be beneficial and which ones to potentially avoid. We discussed continuing all other conservative therapy and see me again in 6-8 weeks for further evaluation and treatment.

## 2015-07-25 NOTE — Progress Notes (Signed)
[name] [place], [place] [address] Phone: [phone] Subjective:    CC: back pain follow-up.   [id number] Shelby [name] is a 39 y.o. female coming in with complaint of back pain.patient did have an acute lumbar radiculopathy multiple months ago.  Patient is doing much better at this time. Patient has been doing more regular exercise regimen. Has been doing yoga. Feels that her core stronger. Staying very active. Denies any radiation or any radicular symptoms. For the last 2 weeks no significant pain at all and no pain medications.   Past Medical History  Diagnosis Date  . GERD (gastroesophageal reflux disease)   . Anxiety       Past Medical History  Diagnosis Date  . GERD (gastroesophageal reflux disease)   . Anxiety    Past Surgical History  Procedure Laterality Date  . Cyst remove  12/2009  . Intrauterine device insertion  12/2009   Social History  Substance Use Topics  . Smoking status: Never Smoker   . 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 Alcohol Use: Yes   No Known Allergies Family History  Problem Relation Age of Onset  . Hyperlipidemia Mother   . Hypertension Mother   . Hypertension Father   . Bipolar disorder Sister     x 2  . Depression Mother    '  Past medical history, social, surgical and family history all reviewed in electronic medical record.   Review of Systems: No headache, visual changes, nausea, vomiting, diarrhea, constipation, dizziness, abdominal pain, skin rash, fevers, chills, night sweats, weight loss, swollen lymph nodes, body aches, joint swelling, muscle aches, chest pain, shortness of breath, mood changes.   Objective Blood pressure 112/82, pulse 72, height 5\' 9"  (1.753 m), weight 181 lb (82.101 kg), SpO2 98 %.  General: patient is very uncomfortable HEENT: Pupils equal, extraocular movements  intact  Respiratory: Patient's speak in full sentences and does not appear short of breath  Cardiovascular: No lower extremity edema, non tender, no erythema  Skin: Warm dry intact with no signs of infection or rash on extremities or on axial skeleton.  Abdomen: Soft nontender  Neuro: Cranial nerves II through XII are intact, neurovascularly intact in all extremities with 2+ DTRs and 2+ pulses.  Lymph: No lymphadenopathy of posterior or anterior cervical chain or axillae bilaterally.  Gait normal with good balance and coordination.  MSK:  Non tender with full range of motion and good stability and symmetric strength and tone of shoulders, elbows, wrist, hip, knee and ankles bilaterally.  Back Exam:  Inspection: Unremarkable  Motion: Flexion 35 deg which is an improvement, Extension 25 deg, Side Bending to 35 deg bilaterally,  Rotation to 45 deg bilaterally  SLR laying: Negative straight leg test  XSLR laying: Negative Palpable tenderness: Nontender FABER: negative. Sensory change: Gross sensation intact to all lumbar and sacral dermatomes.  Reflexes: 2+ at both patellar tendons, 2+ at achilles tendons, Babinski's downgoing.  Strength at foot  Plantar-flexion: 5/5 Dorsi-flexion: 5/5 Eversion: 5/5 Inversion: 5/5  Leg strength  Quad: 5/5 Hamstring: 4/5still weak compared to previous exam Hip flexor: 5/5 Hip abductors: 4/5  Gait unremarkable. Mild increasing and core strength.  Osteopathic findings Sacrum right on right Lumbar L2 flexed rotated and side bent left T5 extended rotated and side bent right    Impression and Recommendations:  This case required medical decision making of moderate complexity.

## 2015-07-25 NOTE — Progress Notes (Signed)
Pre visit review using our clinic review tool, if applicable. No additional management support is needed unless otherwise documented below in the visit note. 

## 2015-07-25 NOTE — Assessment & Plan Note (Signed)
Decision today to treat with OMT was based on Physical Exam  After verbal consent patient was treated with HVLA, ME, FPR techniques in  thoracic, lumbar and sacral areas  Patient tolerated the procedure well with improvement in symptoms  Patient given exercises, stretches and lifestyle modifications  See medications in patient instructions if given  Patient will follow up in  6-8 weeks 

## 2015-07-25 NOTE — Patient Instructions (Addendum)
Good to see you as always.  Ice when you need it Continue to work on that core. You are dong very well overall, I know that life gets in the way.  Wearing good shoes will take the impact of the hard surfaces away.  See me again in 4-6 weeks.

## 2015-08-22 ENCOUNTER — Ambulatory Visit (INDEPENDENT_AMBULATORY_CARE_PROVIDER_SITE_OTHER): Payer: 59 | Admitting: Family Medicine

## 2015-08-22 ENCOUNTER — Encounter: Payer: Self-pay | Admitting: Family Medicine

## 2015-08-22 VITALS — BP 126/76 | HR 90 | Ht 69.0 in | Wt 181.0 lb

## 2015-08-22 DIAGNOSIS — M9904 Segmental and somatic dysfunction of sacral region: Secondary | ICD-10-CM | POA: Diagnosis not present

## 2015-08-22 DIAGNOSIS — M9902 Segmental and somatic dysfunction of thoracic region: Secondary | ICD-10-CM

## 2015-08-22 DIAGNOSIS — M533 Sacrococcygeal disorders, not elsewhere classified: Secondary | ICD-10-CM

## 2015-08-22 DIAGNOSIS — M9903 Segmental and somatic dysfunction of lumbar region: Secondary | ICD-10-CM | POA: Diagnosis not present

## 2015-08-22 DIAGNOSIS — M999 Biomechanical lesion, unspecified: Secondary | ICD-10-CM

## 2015-08-22 NOTE — Assessment & Plan Note (Signed)
Decision today to treat with OMT was based on Physical Exam  After verbal consent patient was treated with HVLA, ME, FPR techniques in  thoracic, lumbar and sacral areas  Patient tolerated the procedure well with improvement in symptoms  Patient given exercises, stretches and lifestyle modifications  See medications in patient instructions if given  Patient will follow up in 4-6 weeks 

## 2015-08-22 NOTE — Progress Notes (Signed)
Pre visit review using our clinic review tool, if applicable. No additional management support is needed unless otherwise documented below in the visit note. 

## 2015-08-22 NOTE — [facility] Notes (Signed)
[name] [place], [name] [address] Phone: [phone] Subjective:    CC: back pain follow-up.   [id number] Shelby [name] is a 39 y.o. female coming in with complaint of back pain.patient did have an acute lumbar radiculopathy multiple months ago.  Patient is doing much better at this time. Patient has been doing more regular exercise regimen. Has been doing yoga.  Patient was doing very well but unfortunately started having some mild exacerbation approximately 10 days ago. Patient was not being able to be as active as she was previously. Patient dismissed some classes or yoga and since then has had some exacerbation. Denies any other radiation though. Does notice benefit from osteopathic manipulation.  No new symptoms is worsening a previous symptoms.   Past Medical History  Diagnosis Date  . GERD (gastroesophageal reflux disease)   . Anxiety       Past Medical History  Diagnosis Date  . GERD (gastroesophageal reflux disease)   . Anxiety    Past Surgical History  Procedure Laterality Date  . Cyst remove  12/2009  . Intrauterine device insertion  12/2009   Social History  Substance Use Topics  . Smoking status: Never Smoker   . 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 Alcohol Use: Yes   No Known Allergies Family History  Problem Relation Age of Onset  . Hyperlipidemia Mother   . Hypertension Mother   . Hypertension Father   . Bipolar disorder Sister     x 2  . Depression Mother    '  Past medical history, social, surgical and family history all reviewed in electronic medical record.   Review of Systems: No headache, visual changes, nausea, vomiting, diarrhea, constipation, dizziness, abdominal pain, skin rash, fevers, chills, night sweats, weight loss, swollen lymph nodes, body aches, joint swelling, muscle aches, chest pain,  shortness of breath, mood changes.   Objective Blood pressure 126/76, pulse 90, height 5\' 9"  (1.753 m), weight 181 lb (82.101 kg), SpO2 97 %.  General: patient is very uncomfortable HEENT: Pupils equal, extraocular movements intact  Respiratory: Patient's speak in full sentences and does not appear short of breath  Cardiovascular: No lower extremity edema, non tender, no erythema  Skin: Warm dry intact with no signs of infection or rash on extremities or on axial skeleton.  Abdomen: Soft nontender  Neuro: Cranial nerves II through XII are intact, neurovascularly intact in all extremities with 2+ DTRs and 2+ pulses.  Lymph: No lymphadenopathy of posterior or anterior cervical chain or axillae bilaterally.  Gait normal with good balance and coordination.  MSK:  Non tender with full range of motion and good stability and symmetric strength and tone of shoulders, elbows, wrist, hip, knee and ankles bilaterally.  Back Exam:  Inspection: Unremarkable  Motion: Flexion 35 deg which is an improvement, Extension 25 deg, Side Bending to 35 deg bilaterally,  Rotation to 45 deg bilaterally  SLR laying: Negative straight leg test  XSLR laying: Negative Palpable tenderness: Nontender FABER: negative. Sensory change: Gross sensation intact to all lumbar and sacral dermatomes.  Reflexes: 2+ at both patellar tendons, 2+ at achilles tendons, Babinski's downgoing.  Strength at foot  Plantar-flexion: 5/5 Dorsi-flexion: 5/5 Eversion: 5/5 Inversion: 5/5  Leg strength  Quad: 5/5 Hamstring: 4/5still weak compared to previous exam Hip flexor: 5/5 Hip abductors: 4/5  Gait  unremarkable. Mild increasing and core strength.  Osteopathic findings Sacrum right on right Lumbar L2 flexed rotated and side bent left T5 extended rotated and side bent right Right anterior ilium   Impression and Recommendations:     This case required medical decision making of moderate complexity.

## 2015-08-22 NOTE — Assessment & Plan Note (Signed)
Patient overall is likely still having some exacerbation of the sacroiliac joint. We discussed the icing regimen, home exercises, in which activities to do. Patient will continue with the natural supplementations. We discussed the importance of hip abductors. I would like to space patient now but unfortunately I don't think she is able to at this point. I would like to follow-up with her again in the next 4-6 weeks.

## 2015-08-22 NOTE — Patient Instructions (Signed)
Verbal instructions given

## 2015-09-19 ENCOUNTER — Ambulatory Visit (INDEPENDENT_AMBULATORY_CARE_PROVIDER_SITE_OTHER): Payer: 59 | Admitting: Family Medicine

## 2015-09-19 ENCOUNTER — Encounter: Payer: Self-pay | Admitting: Family Medicine

## 2015-09-19 VITALS — BP 114/80 | HR 61 | Ht 69.0 in | Wt 183.0 lb

## 2015-09-19 DIAGNOSIS — G5701 Lesion of sciatic nerve, right lower limb: Secondary | ICD-10-CM | POA: Diagnosis not present

## 2015-09-19 DIAGNOSIS — M9903 Segmental and somatic dysfunction of lumbar region: Secondary | ICD-10-CM | POA: Diagnosis not present

## 2015-09-19 DIAGNOSIS — M9904 Segmental and somatic dysfunction of sacral region: Secondary | ICD-10-CM

## 2015-09-19 DIAGNOSIS — M999 Biomechanical lesion, unspecified: Secondary | ICD-10-CM

## 2015-09-19 DIAGNOSIS — M9902 Segmental and somatic dysfunction of thoracic region: Secondary | ICD-10-CM

## 2015-09-19 NOTE — [facility] Notes (Signed)
[name] [place], [place] [address] Phone: [phone] Subjective:    CC: back pain follow-up.   [id number] [place] [name] is a 39 y.o. female coming in with complaint of back pain.patient did have an acute lumbar radiculopathy multiple months ago.  States that she is no longer having the radiculopathy but is having more tightness in the posterior aspect. Patient describes the pain as a dull, throbbing aching sensation. Patient states that this happened after yoga. Has been treated for sacroiliac dysfunction. Having more pain though over the piriformis muscle itself. Has been doing some manipulation with improvement.  Past Medical History  Diagnosis Date  . GERD (gastroesophageal reflux disease)   . Anxiety       Past Medical History  Diagnosis Date  . GERD (gastroesophageal reflux disease)   . Anxiety    Past Surgical History  Procedure Laterality Date  . Cyst remove  12/2009  . Intrauterine device insertion  12/2009   Social History  Substance Use Topics  . Smoking status: Never Smoker   . 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 Alcohol Use: Yes   No Known Allergies Family History  Problem Relation Age of Onset  . Hyperlipidemia Mother   . Hypertension Mother   . Hypertension Father   . Bipolar disorder Sister     x 2  . Depression Mother    '  Past medical history, social, surgical and family history all reviewed in electronic medical record.   Review of Systems: No headache, visual changes, nausea, vomiting, diarrhea, constipation, dizziness, abdominal pain, skin rash, fevers, chills, night sweats, weight loss, swollen lymph nodes, body aches, joint swelling, muscle aches, chest pain, shortness of breath, mood changes.   Objective Blood pressure 114/80, pulse 61, height 5\' 9"  (1.753 m), weight 183 lb (83.008 kg), SpO2 98 %.    General: patient is very uncomfortable HEENT: Pupils equal, extraocular movements intact  Respiratory: Patient's speak in full sentences and does not appear short of breath  Cardiovascular: No lower extremity edema, non tender, no erythema  Skin: Warm dry intact with no signs of infection or rash on extremities or on axial skeleton.  Abdomen: Soft nontender  Neuro: Cranial nerves II through XII are intact, neurovascularly intact in all extremities with 2+ DTRs and 2+ pulses.  Lymph: No lymphadenopathy of posterior or anterior cervical chain or axillae bilaterally.  Gait normal with good balance and coordination.  MSK:  Non tender with full range of motion and good stability and symmetric strength and tone of shoulders, elbows, wrist, hip, knee and ankles bilaterally.  Back Exam:  Inspection: Unremarkable  Motion: Flexion 45 deg Extension 25 deg, Side Bending to 35 deg bilaterally,  Rotation to 45 deg bilaterally  SLR laying: Negative straight leg test  XSLR laying: Negative Palpable tenderness: Nontender FABER: Positive right side Sensory change: Gross sensation intact to all lumbar and sacral dermatomes.  Reflexes: 2+ at both patellar tendons, 2+ at achilles tendons, Babinski's downgoing.  Strength at foot  Plantar-flexion: 5/5 Dorsi-flexion: 5/5 Eversion: 5/5 Inversion: 5/5  Leg strength  Quad: 5/5 Hamstring: 4/5\symmetric  Hip flexor: 5/5 Hip abductors: 4/5  Gait unremarkable. Mild increasing and core strength.  Osteopathic findings T5 extended rotated and side bent right  L2 flexed rotated and side bent left L4 flexed rotated inside that right Sacrum right  on right Right anterior ilium   Impression and Recommendations:     This case required medical decision making of moderate complexity.

## 2015-09-19 NOTE — Progress Notes (Signed)
Pre visit review using our clinic review tool, if applicable. No additional management support is needed unless otherwise documented below in the visit note. 

## 2015-09-19 NOTE — Assessment & Plan Note (Signed)
Decision today to treat with OMT was based on Physical Exam  After verbal consent patient was treated with HVLA, ME, FPR techniques in  thoracic, lumbar and sacral areas  Patient tolerated the procedure well with improvement in symptoms  Patient given exercises, stretches and lifestyle modifications  See medications in patient instructions if given  Patient will follow up in 4-6 weeks 

## 2015-09-19 NOTE — Assessment & Plan Note (Signed)

## 2015-10-12 DIAGNOSIS — F411 Generalized anxiety disorder: Secondary | ICD-10-CM | POA: Diagnosis not present

## 2015-10-14 MED FILL — ALPRAZolam 0.5 MG TABS: 0.5 | 15 days supply | Qty: 60 | Fill #1

## 2015-10-19 ENCOUNTER — Encounter: Payer: Self-pay | Admitting: Family Medicine

## 2015-10-25 ENCOUNTER — Encounter: Payer: Self-pay | Admitting: Family Medicine

## 2015-10-25 ENCOUNTER — Ambulatory Visit (INDEPENDENT_AMBULATORY_CARE_PROVIDER_SITE_OTHER): Payer: 59 | Admitting: Family Medicine

## 2015-10-25 VITALS — BP 134/82 | HR 76 | Ht 69.0 in | Wt 180.0 lb

## 2015-10-25 DIAGNOSIS — G5701 Lesion of sciatic nerve, right lower limb: Secondary | ICD-10-CM

## 2015-10-25 DIAGNOSIS — M9902 Segmental and somatic dysfunction of thoracic region: Secondary | ICD-10-CM | POA: Diagnosis not present

## 2015-10-25 DIAGNOSIS — M9904 Segmental and somatic dysfunction of sacral region: Secondary | ICD-10-CM | POA: Diagnosis not present

## 2015-10-25 DIAGNOSIS — M9903 Segmental and somatic dysfunction of lumbar region: Secondary | ICD-10-CM

## 2015-10-25 DIAGNOSIS — M999 Biomechanical lesion, unspecified: Secondary | ICD-10-CM

## 2015-10-25 NOTE — Assessment & Plan Note (Signed)
Decision today to treat with OMT was based on Physical Exam  After verbal consent patient was treated with HVLA, ME, FPR techniques in  thoracic, lumbar and sacral areas  Patient tolerated the procedure well with improvement in symptoms  Patient given exercises, stretches and lifestyle modifications  See medications in patient instructions if given  Patient will follow up in 4 weeks 

## 2015-10-25 NOTE — [facility] Notes (Signed)
[name] [place], [place] [address] Phone: [phone] Subjective:    CC: back pain follow-up.   [id number] [place] [name] is a 40 y.o. female coming in with complaint of back pain.patient did have an acute lumbar radiculopathy multiple months ago.  Patient has been doing relatively well. Having increasing lower back pain. Did ski last weekend. Patient is now traveling to [place] for a destination yoga trip. Patient has been doing yoga and plan tennis fairly regularly. Having some mild increased stiffness of the lower back and somewhat of the upper back as well. No radiation to any activities. Nothing that stopping her from activities. Does take ibuprofen occasionally.  Past Medical History  Diagnosis Date  . GERD (gastroesophageal reflux disease)   . Anxiety       Past Medical History  Diagnosis Date  . GERD (gastroesophageal reflux disease)   . Anxiety    Past Surgical History  Procedure Laterality Date  . Cyst remove  12/2009  . Intrauterine device insertion  12/2009   Social History  Substance Use Topics  . Smoking status: Never Smoker   . 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 Alcohol Use: Yes   No Known Allergies Family History  Problem Relation Age of Onset  . Hyperlipidemia Mother   . Hypertension Mother   . Hypertension Father   . Bipolar disorder Sister     x 2  . Depression Mother    '  Past medical history, social, surgical and family history all reviewed in electronic medical record.   Review of Systems: No headache, visual changes, nausea, vomiting, diarrhea, constipation, dizziness, abdominal pain, skin rash, fevers, chills, night sweats, weight loss, swollen lymph nodes, body aches, joint swelling, muscle aches, chest pain, shortness of breath, mood changes.   Objective Blood pressure 134/82, pulse 76, height 5'  9" (1.753 m), weight 180 lb (81.647 kg), SpO2 98 %.  General: patient is very uncomfortable HEENT: Pupils equal, extraocular movements intact  Respiratory: Patient's speak in full sentences and does not appear short of breath  Cardiovascular: No lower extremity edema, non tender, no erythema  Skin: Warm dry intact with no signs of infection or rash on extremities or on axial skeleton.  Abdomen: Soft nontender  Neuro: Cranial nerves II through XII are intact, neurovascularly intact in all extremities with 2+ DTRs and 2+ pulses.  Lymph: No lymphadenopathy of posterior or anterior cervical chain or axillae bilaterally.  Gait normal with good balance and coordination.  MSK:  Non tender with full range of motion and good stability and symmetric strength and tone of shoulders, elbows, wrist, hip, knee and ankles bilaterally.  Back Exam:  Inspection: Unremarkable  Motion: Flexion 45 deg Extension 25 deg, Side Bending to 35 deg bilaterally,  Rotation to 45 deg bilaterally  SLR laying: Negative straight leg test  XSLR laying: Negative Palpable tenderness: Nontender FABER: Negative today which is an improvement Sensory change: Gross sensation intact to all lumbar and sacral dermatomes.  Reflexes: 2+ at both patellar tendons, 2+ at achilles tendons, Babinski's downgoing.  Strength at foot  Plantar-flexion: 5/5 Dorsi-flexion: 5/5 Eversion: 5/5 Inversion: 5/5  Leg strength  Quad: 5/5 Hamstring: 4/5\symmetric  Hip flexor: 5/5 Hip abductors: 4/5  Gait unremarkable. Continued to have some deficiency and core strength  Osteopathic findings 3 extended rotated and side bent left  with inhaled third rib T5 extended rotated and side bent right T7 extended rotated and side bent left  L2 flexed rotated and side bent left L4 flexed rotated inside that right Sacrum right on right Ileum neutral   Impression and Recommendations:     This case required medical decision making of moderate  complexity.

## 2015-10-25 NOTE — Patient Instructions (Signed)
Go to see you  See me again maybe when you get back Have fun

## 2015-10-25 NOTE — Assessment & Plan Note (Signed)
Overall stable. Encourage continue hip abductor strengthening. We discussed icing regimen. Discussed which activities ago and which ones to potentially avoid. Patient will come back and see me again in 4 weeks for further evaluation and treatment.

## 2015-10-25 NOTE — Progress Notes (Signed)
Pre visit review using our clinic review tool, if applicable. No additional management support is needed unless otherwise documented below in the visit note. 

## 2015-11-02 DIAGNOSIS — F411 Generalized anxiety disorder: Secondary | ICD-10-CM | POA: Diagnosis not present

## 2015-11-10 MED FILL — AZITHROMYCIN 500 MG TABLET: 500 | 3 days supply | Qty: 3 | Fill #0

## 2015-11-14 ENCOUNTER — Encounter: Payer: Self-pay | Admitting: Family Medicine

## 2015-11-22 ENCOUNTER — Ambulatory Visit (INDEPENDENT_AMBULATORY_CARE_PROVIDER_SITE_OTHER): Payer: 59 | Admitting: Family Medicine

## 2015-11-22 ENCOUNTER — Encounter: Payer: Self-pay | Admitting: Family Medicine

## 2015-11-22 VITALS — BP 120/82 | HR 91 | Ht 69.0 in | Wt 180.0 lb

## 2015-11-22 DIAGNOSIS — M9902 Segmental and somatic dysfunction of thoracic region: Secondary | ICD-10-CM

## 2015-11-22 DIAGNOSIS — M9904 Segmental and somatic dysfunction of sacral region: Secondary | ICD-10-CM | POA: Diagnosis not present

## 2015-11-22 DIAGNOSIS — M999 Biomechanical lesion, unspecified: Secondary | ICD-10-CM

## 2015-11-22 DIAGNOSIS — M9903 Segmental and somatic dysfunction of lumbar region: Secondary | ICD-10-CM

## 2015-11-22 DIAGNOSIS — J0101 Acute recurrent maxillary sinusitis: Secondary | ICD-10-CM

## 2015-11-22 DIAGNOSIS — J329 Chronic sinusitis, unspecified: Secondary | ICD-10-CM | POA: Insufficient documentation

## 2015-11-22 DIAGNOSIS — M533 Sacrococcygeal disorders, not elsewhere classified: Secondary | ICD-10-CM

## 2015-11-22 MED ORDER — AMOXICILLIN-POT CLAVULANATE 875-125 MG PO TABS: 1.0000 | ORAL_TABLET | Freq: Two times a day (BID) | ORAL | Status: DC

## 2015-11-22 MED FILL — AMOX TR-K CLV 875-125 MG TA: 875-125 | 10 days supply | Qty: 20 | Fill #0

## 2015-11-22 MED FILL — ALPRAZolam 0.5 MG TABS: 0.5 | 15 days supply | Qty: 60 | Fill #2

## 2015-11-22 NOTE — [facility] Notes (Signed)
[name] [place], [place] [address] Phone: [phone] Subjective:    CC: back pain follow-up.   [id number] [place] [name] is a 40 y.o. female coming in with complaint of back pain.patient did have an acute lumbar radiculopathy multiple months ago.  Patient has been doing very well but did have an exacerbation where she was having some radicular symptoms going down the anterior aspect of her leg. Patient states that this is been the first time and multiple months. Not cause any weakness. States that it has completely resolved again but is continuing to have low back pain. Is responded well to osteopathic manipulation   Patient recently had an illness and is continuing to get over it. Patient was put on Zithromax but is having worsening symptoms. Patient is leaving the country for one week and would like to feel good. Feels fullness in her face.  Past Medical History  Diagnosis Date  . GERD (gastroesophageal reflux disease)   . Anxiety       Past Medical History  Diagnosis Date  . GERD (gastroesophageal reflux disease)   . Anxiety    Past Surgical History  Procedure Laterality Date  . Cyst remove  12/2009  . Intrauterine device insertion  12/2009   Social History  Substance Use Topics  . Smoking status: Never Smoker   . 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 Alcohol Use: Yes   No Known Allergies Family History  Problem Relation Age of Onset  . Hyperlipidemia Mother   . Hypertension Mother   . Hypertension Father   . Bipolar disorder Sister     x 2  . Depression Mother    '  Past medical history, social, surgical and family history all reviewed in electronic medical record.   Review of Systems: No headache, visual changes, nausea, vomiting, diarrhea, constipation, dizziness, abdominal pain, skin rash, fevers, chills, night sweats,  weight loss, swollen lymph nodes, body aches, joint swelling, muscle aches, chest pain, shortness of breath, mood changes.   Objective Blood pressure 120/82, pulse 91, height 5\' 9"  (1.753 m), weight 180 lb (81.647 kg), SpO2 98 %.  General: patient is very uncomfortable HEENT: Pupils equal, extraocular movements intact patient's tender to palpation over the maxillary sinus on the right side. Respiratory: Patient's speak in full sentences and does not appear short of breath  Cardiovascular: No lower extremity edema, non tender, no erythema  Skin: Warm dry intact with no signs of infection or rash on extremities or on axial skeleton.  Abdomen: Soft nontender  Neuro: Cranial nerves II through XII are intact, neurovascularly intact in all extremities with 2+ DTRs and 2+ pulses.  Lymph: No lymphadenopathy of posterior or anterior cervical chain or axillae bilaterally.  Gait normal with good balance and coordination.  MSK:  Non tender with full range of motion and good stability and symmetric strength and tone of shoulders, elbows, wrist, hip, knee and ankles bilaterally.  Back Exam:  Inspection: Unremarkable  Motion: Flexion 45 deg Extension 25 deg, Side Bending to 35 deg bilaterally,  Rotation to 45 deg bilaterally  SLR laying: Negative straight leg test  XSLR laying: Negative Palpable tenderness: Mild increasing tenderness of the right sided lower back pain in the paraspinal musculature. FABER: Negative today which is an improvement Sensory change: Gross sensation intact to all lumbar and sacral dermatomes.  Reflexes: 2+ at both patellar tendons, 2+ at achilles tendons, Babinski's downgoing.  Strength at foot  Plantar-flexion: 5/5 Dorsi-flexion: 5/5 Eversion: 5/5 Inversion: 5/5  Leg strength  Quad: 5/5 Hamstring: 4/5\symmetric  Hip flexor: 5/5 Hip abductors: 4/5  Gait unremarkable. Continued to have some deficiency and core strength  Osteopathic findings T3 extended rotated and side bent  left with inhaled third rib T5 extended rotated and side bent right T7 extended rotated and side bent left  L2 flexed rotated and side bent left L4 flexed rotated inside that right Sacrum right on right l   Impression and Recommendations:     This case required medical decision making of moderate complexity.

## 2015-11-22 NOTE — Assessment & Plan Note (Signed)
Decision today to treat with OMT was based on Physical Exam  After verbal consent patient was treated with HVLA, ME, FPR techniques in  thoracic, lumbar and sacral areas  Patient tolerated the procedure well with improvement in symptoms  Patient given exercises, stretches and lifestyle modifications  See medications in patient instructions if given  Patient will follow up in 3-4 weeks 

## 2015-11-22 NOTE — Assessment & Plan Note (Signed)
She does have some dysfunction overall. Continues to give her some difficulty. I do not see any of the lumbar radiculopathy flexion had previously. Some mild tightness of the piriformis syndrome. Did respond well to osteopathic manipulation. Encourage her to do the hip abductors on a more regular basis. Given some range of motion exercises. We'll follow up again with me in 3-4 weeks.

## 2015-11-22 NOTE — Progress Notes (Signed)
Pre visit review using our clinic review tool, if applicable. No additional management support is needed unless otherwise documented below in the visit note. 

## 2015-11-22 NOTE — Patient Instructions (Signed)
Have fun  Remember the cup  See me when you need me

## 2015-12-05 ENCOUNTER — Encounter: Payer: Self-pay | Admitting: Family Medicine

## 2015-12-07 DIAGNOSIS — F411 Generalized anxiety disorder: Secondary | ICD-10-CM | POA: Diagnosis not present

## 2015-12-13 ENCOUNTER — Ambulatory Visit (INDEPENDENT_AMBULATORY_CARE_PROVIDER_SITE_OTHER): Payer: 59 | Admitting: Family Medicine

## 2015-12-13 ENCOUNTER — Encounter: Payer: Self-pay | Admitting: Family Medicine

## 2015-12-13 VITALS — BP 108/80 | HR 70

## 2015-12-13 DIAGNOSIS — M9902 Segmental and somatic dysfunction of thoracic region: Secondary | ICD-10-CM

## 2015-12-13 DIAGNOSIS — M9903 Segmental and somatic dysfunction of lumbar region: Secondary | ICD-10-CM

## 2015-12-13 DIAGNOSIS — M533 Sacrococcygeal disorders, not elsewhere classified: Secondary | ICD-10-CM | POA: Diagnosis not present

## 2015-12-13 DIAGNOSIS — M9904 Segmental and somatic dysfunction of sacral region: Secondary | ICD-10-CM

## 2015-12-13 DIAGNOSIS — M999 Biomechanical lesion, unspecified: Secondary | ICD-10-CM

## 2015-12-13 NOTE — Progress Notes (Signed)
Pre visit review using our clinic review tool, if applicable. No additional management support is needed unless otherwise documented below in the visit note. 

## 2015-12-13 NOTE — Patient Instructions (Signed)
Good to see you  Omega 3 more than omega 6 foods Turmeric 500mg  twice daily  Bromelain 2400mg  with each meal  Iron 65mg  and 500mg  vitamin C 3 times a week See me again in 3 weeks

## 2015-12-13 NOTE — Assessment & Plan Note (Signed)
Decision today to treat with OMT was based on Physical Exam  After verbal consent patient was treated with HVLA, ME, FPR techniques in  thoracic, lumbar and sacral areas  Patient tolerated the procedure well with improvement in symptoms  Patient given exercises, stretches and lifestyle modifications  See medications in patient instructions if given  Patient will follow up in 3-4 weeks 

## 2015-12-13 NOTE — [facility] Notes (Signed)
[name] [place], [place] [address] Phone: [phone] Subjective:    CC: back pain follow-up.   [id number] [place] [name] is a 40 y.o. female coming in with complaint of back pain.patient did have an acute lumbar radiculopathy multiple months ago. Patient continues to have some chronic back pain that seems to be more associated with the sacroiliac joint. Patient has noticed some increasing discomfort on the lateral hips bilaterally. Patient states it does not seem to go away at any point. Patient did travel recently. States that since having this difficulty she is been doing less activity as well. No radiation down the leg. Nothing that seems to be as severe as what was previously.    Past Medical History  Diagnosis Date  . GERD (gastroesophageal reflux disease)   . Anxiety       Past Medical History  Diagnosis Date  . GERD (gastroesophageal reflux disease)   . Anxiety    Past Surgical History  Procedure Laterality Date  . Cyst remove  12/2009  . Intrauterine device insertion  12/2009   Social History  Substance Use Topics  . Smoking status: Never Smoker   . 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 Alcohol Use: Yes   No Known Allergies Family History  Problem Relation Age of Onset  . Hyperlipidemia Mother   . Hypertension Mother   . Hypertension Father   . Bipolar disorder Sister     x 2  . Depression Mother    '  Past medical history, social, surgical and family history all reviewed in electronic medical record.   Review of Systems: No headache, visual changes, nausea, vomiting, diarrhea, constipation, dizziness, abdominal pain, skin rash, fevers, chills, night sweats, weight loss, swollen lymph nodes, body aches, joint swelling, muscle aches, chest pain, shortness of breath, mood changes.   Objective Blood pressure 108/80,  pulse 70, SpO2 98 %.  General: patient is very uncomfortable HEENT: Pupils equal, extraocular movements intact patient's tender to palpation over the maxillary sinus on the right side. Respiratory: Patient's speak in full sentences and does not appear short of breath  Cardiovascular: No lower extremity edema, non tender, no erythema  Skin: Warm dry intact with no signs of infection or rash on extremities or on axial skeleton.  Abdomen: Soft nontender  Neuro: Cranial nerves II through XII are intact, neurovascularly intact in all extremities with 2+ DTRs and 2+ pulses.  Lymph: No lymphadenopathy of posterior or anterior cervical chain or axillae bilaterally.  Gait normal with good balance and coordination.  MSK:  Non tender with full range of motion and good stability and symmetric strength and tone of shoulders, elbows, wrist, hip, knee and ankles bilaterally.  Back Exam:  Inspection: Unremarkable  Motion: Flexion 45 deg Extension 25 deg, Side Bending to 35 deg bilaterally,  Rotation to 45 deg bilaterally  SLR laying: Negative straight leg test  XSLR laying: Negative Palpable tenderness: Continued discomfort over the paraspinal musculature on the right side FABER: Negative today which is an improvement Sensory change: Gross sensation intact to all lumbar and sacral dermatomes.  Reflexes: 2+ at both patellar tendons, 2+ at achilles tendons, Babinski's downgoing.  Strength at foot  Plantar-flexion: 5/5 Dorsi-flexion: 5/5 Eversion: 5/5 Inversion: 5/5  Leg strength  Quad: 5/5 Hamstring: 4/5\symmetric  Hip flexor: 5/5 Hip abductors: 4/5  Gait unremarkable.  More tenderness to palpation over the lateral aspect of the hips  Osteopathic findings T3 extended rotated and side bent left with inhaled third rib T5 extended rotated and side bent right  L2 flexed rotated and side bent left L4 flexed rotated inside that right Sacrum right on right l   Impression and Recommendations:     This  case required medical decision making of moderate complexity.

## 2015-12-13 NOTE — Assessment & Plan Note (Signed)
Patient continues to have discomfort at this time. I do think the patient has some inflammation overall. We discussed different diet changes. Patient was to do home exercises on a more regular basis. We discussed core strengthening. We discussed icing regimen. Patient will come back and see me again in 3 weeks.

## 2015-12-21 MED FILL — ALPRAZolam 0.5 MG TABS: 0.5 | 15 days supply | Qty: 60 | Fill #3

## 2015-12-28 ENCOUNTER — Encounter: Payer: Self-pay | Admitting: Family Medicine

## 2015-12-29 DIAGNOSIS — D2261 Melanocytic nevi of right upper limb, including shoulder: Secondary | ICD-10-CM | POA: Diagnosis not present

## 2015-12-29 DIAGNOSIS — L814 Other melanin hyperpigmentation: Secondary | ICD-10-CM | POA: Diagnosis not present

## 2015-12-29 DIAGNOSIS — L819 Disorder of pigmentation, unspecified: Secondary | ICD-10-CM | POA: Diagnosis not present

## 2015-12-29 DIAGNOSIS — D225 Melanocytic nevi of trunk: Secondary | ICD-10-CM | POA: Diagnosis not present

## 2015-12-29 DIAGNOSIS — D2271 Melanocytic nevi of right lower limb, including hip: Secondary | ICD-10-CM | POA: Diagnosis not present

## 2016-01-04 ENCOUNTER — Encounter (INDEPENDENT_AMBULATORY_CARE_PROVIDER_SITE_OTHER): Payer: Self-pay

## 2016-01-08 ENCOUNTER — Encounter: Payer: Self-pay | Admitting: Family Medicine

## 2016-01-10 ENCOUNTER — Encounter: Payer: Self-pay | Admitting: Family Medicine

## 2016-01-10 ENCOUNTER — Ambulatory Visit (INDEPENDENT_AMBULATORY_CARE_PROVIDER_SITE_OTHER): Payer: 59 | Admitting: Family Medicine

## 2016-01-10 VITALS — BP 104/72 | HR 69 | Ht 69.0 in

## 2016-01-10 DIAGNOSIS — M9903 Segmental and somatic dysfunction of lumbar region: Secondary | ICD-10-CM | POA: Diagnosis not present

## 2016-01-10 DIAGNOSIS — M533 Sacrococcygeal disorders, not elsewhere classified: Secondary | ICD-10-CM | POA: Diagnosis not present

## 2016-01-10 DIAGNOSIS — M9902 Segmental and somatic dysfunction of thoracic region: Secondary | ICD-10-CM

## 2016-01-10 DIAGNOSIS — M9904 Segmental and somatic dysfunction of sacral region: Secondary | ICD-10-CM | POA: Diagnosis not present

## 2016-01-10 DIAGNOSIS — M999 Biomechanical lesion, unspecified: Secondary | ICD-10-CM

## 2016-01-10 NOTE — [facility] Notes (Signed)
[name] [place], [place] [address] Phone: [phone] Subjective:    CC: back pain follow-up.   [id number] Shelby [name] is a 40 y.o. female coming in with complaint of back pain.patient did have an acute lumbar radiculopathy multiple months ago.  Since then she's been treated more for an sacroiliac joint dysfunction. Patient has been doing very well. Started taking the vitamins on a regular basis and is having improved range of motion and is working on a more regular basis. Has not been icing. Patient though has noticed that overall she may be improving. Patient has cautiously optimistic.    Past Medical History  Diagnosis Date  . GERD (gastroesophageal reflux disease)   . Anxiety       Past Medical History  Diagnosis Date  . GERD (gastroesophageal reflux disease)   . Anxiety    Past Surgical History  Procedure Laterality Date  . Cyst remove  12/2009  . Intrauterine device insertion  12/2009   Social History  Substance Use Topics  . Smoking status: Never Smoker   . 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 Alcohol Use: Yes   No Known Allergies Family History  Problem Relation Age of Onset  . Hyperlipidemia Mother   . Hypertension Mother   . Hypertension Father   . Bipolar disorder Sister     x 2  . Depression Mother    '  Past medical history, social, surgical and family history all reviewed in electronic medical record.   Review of Systems: No headache, visual changes, nausea, vomiting, diarrhea, constipation, dizziness, abdominal pain, skin rash, fevers, chills, night sweats, weight loss, swollen lymph nodes, body aches, joint swelling, muscle aches, chest pain, shortness of breath, mood changes.   Objective Blood pressure 104/72, pulse 69, height 5\' 9"  (1.753 m), SpO2 97 %.  General: patient is very uncomfortable HEENT:  Pupils equal, extraocular movements intact patient's tender to palpation over the maxillary sinus on the right side. Respiratory: Patient's speak in full sentences and does not appear short of breath  Cardiovascular: No lower extremity edema, non tender, no erythema  Skin: Warm dry intact with no signs of infection or rash on extremities or on axial skeleton.  Abdomen: Soft nontender  Neuro: Cranial nerves II through XII are intact, neurovascularly intact in all extremities with 2+ DTRs and 2+ pulses.  Lymph: No lymphadenopathy of posterior or anterior cervical chain or axillae bilaterally.  Gait normal with good balance and coordination.  MSK:  Non tender with full range of motion and good stability and symmetric strength and tone of shoulders, elbows, wrist, hip, knee and ankles bilaterally.  Back Exam:  Inspection: Unremarkable  Motion: Flexion 45 deg Extension 25 deg, Side Bending to 35 deg bilaterally,  Rotation to 45 deg bilaterally  SLR laying: Negative straight leg test  XSLR laying: Negative Palpable tenderness: Mild right sacroiliac joint pain FABER: Negative today which is an improvement Sensory change: Gross sensation intact to all lumbar and sacral dermatomes.  Reflexes: 2+ at both patellar tendons, 2+ at achilles tendons, Babinski's downgoing.  Strength at foot  Plantar-flexion: 5/5 Dorsi-flexion: 5/5 Eversion: 5/5 Inversion: 5/5  Leg strength  Quad: 5/5 Hamstring: 5/5\symmetric with improvement  Hip flexor: 5/5 Hip abductors: 4/5 seems stable Gait unremarkable.  Osteopathic findings T3 extended rotated and side bent left with inhaled third  rib T5 extended rotated and side bent right  L2 flexed rotated and side bent left Sacrum right on right l right anterior ilium   Impression and Recommendations:     This case required medical decision making of moderate complexity.

## 2016-01-10 NOTE — Patient Instructions (Signed)
Good to see you  Ice 20 minutes 2 times daily. Usually after activity and before bed. DHEA 50 mg daily for 4 weeks.  Bromelain 2400 giu with each meal See me after spring break.

## 2016-01-10 NOTE — Progress Notes (Signed)
Pre visit review using our clinic review tool, if applicable. No additional management support is needed unless otherwise documented below in the visit note. 

## 2016-01-10 NOTE — Assessment & Plan Note (Signed)
Patient is doing much better overall. We did discuss the hip abductors again. We discussed different over-the-counter medications and potentially weight loss. We discussed continuing the icing. Follow-up again after patient is going out of town for further evaluation.

## 2016-01-10 NOTE — Assessment & Plan Note (Signed)
Decision today to treat with OMT was based on Physical Exam  After verbal consent patient was treated with HVLA, ME, FPR techniques in  thoracic, lumbar and sacral areas  Patient tolerated the procedure well with improvement in symptoms  Patient given exercises, stretches and lifestyle modifications  See medications in patient instructions if given  Patient will follow up in 3-6 weeks 

## 2016-01-26 ENCOUNTER — Encounter: Payer: Self-pay | Admitting: Family Medicine

## 2016-02-02 ENCOUNTER — Encounter: Payer: Self-pay | Admitting: Family Medicine

## 2016-02-02 ENCOUNTER — Ambulatory Visit (INDEPENDENT_AMBULATORY_CARE_PROVIDER_SITE_OTHER): Payer: 59 | Admitting: Family Medicine

## 2016-02-02 DIAGNOSIS — M9902 Segmental and somatic dysfunction of thoracic region: Secondary | ICD-10-CM

## 2016-02-02 DIAGNOSIS — M533 Sacrococcygeal disorders, not elsewhere classified: Secondary | ICD-10-CM

## 2016-02-02 DIAGNOSIS — M9903 Segmental and somatic dysfunction of lumbar region: Secondary | ICD-10-CM

## 2016-02-02 DIAGNOSIS — M9904 Segmental and somatic dysfunction of sacral region: Secondary | ICD-10-CM | POA: Diagnosis not present

## 2016-02-02 DIAGNOSIS — M999 Biomechanical lesion, unspecified: Secondary | ICD-10-CM

## 2016-02-02 MED ORDER — TIZANIDINE HCL 4 MG PO TABS: 4.0000 mg | ORAL_TABLET | Freq: Every evening | ORAL | Status: DC

## 2016-02-02 MED FILL — tiZANidine HCL 4 MG TABS: 4 | 30 days supply | Qty: 30 | Fill #0

## 2016-02-02 NOTE — Assessment & Plan Note (Signed)
Patient is having more of an exacerbation at this time. Given a muscle relaxer that I think will be beneficial. Did respond well to osteopathic manipulation. Encourage her to continue the exercises and work on more core strengthening and trunk stretching. Patient given trial topical anti-inflammatories as well as oral anti-inflammatories that should help. Patient will come back though in 1 week, the day before she would go on her vacation and if worsening pain we can consider sacroiliac joint injections.

## 2016-02-02 NOTE — [facility] Notes (Signed)
[name] [place], [place] [address] Phone: [phone] Subjective:    CC: back pain follow-up.   [id number] Shelby [name] is a 40 y.o. female coming in with complaint of back pain.patient did have an acute lumbar radiculopathy multiple months ago.  Since then she's been treated more for an sacroiliac joint dysfunction. Patient was doing significant amount yard work. Certainly having worsening pain. Patient states that she was having and difficulty straightening out. Has been taking ibuprofen very continuously for the last week. Rates the severity pain as 5 out of 10. Seems to be worsening. Patient states it's more of a tightness. Some mild radicular symptoms going down the leg but nothing like it was previously. No weakness. Concerned because she is going out of town next week. Seems to be better when she is doing her yoga on a regular basis.    Past Medical History  Diagnosis Date  . GERD (gastroesophageal reflux disease)   . Anxiety       Past Medical History  Diagnosis Date  . GERD (gastroesophageal reflux disease)   . Anxiety    Past Surgical History  Procedure Laterality Date  . Cyst remove  12/2009  . Intrauterine device insertion  12/2009   Social History  Substance Use Topics  . Smoking status: Never Smoker   . 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 Alcohol Use: Yes   No Known Allergies Family History  Problem Relation Age of Onset  . Hyperlipidemia Mother   . Hypertension Mother   . Hypertension Father   . Bipolar disorder Sister     x 2  . Depression Mother    '  Past medical history, social, surgical and family history all reviewed in electronic medical record.   Review of Systems: No headache, visual changes, nausea, vomiting, diarrhea, constipation, dizziness, abdominal pain, skin rash, fevers, chills, night  sweats, weight loss, swollen lymph nodes, body aches, joint swelling, muscle aches, chest pain, shortness of breath, mood changes.   Objective There were no vitals taken for this visit.  General: patient is very uncomfortable HEENT: Pupils equal, extraocular movements intact patient's tender to palpation over the maxillary sinus on the right side. Respiratory: Patient's speak in full sentences and does not appear short of breath  Cardiovascular: No lower extremity edema, non tender, no erythema  Skin: Warm dry intact with no signs of infection or rash on extremities or on axial skeleton.  Abdomen: Soft nontender  Neuro: Cranial nerves II through XII are intact, neurovascularly intact in all extremities with 2+ DTRs and 2+ pulses.  Lymph: No lymphadenopathy of posterior or anterior cervical chain or axillae bilaterally.  Gait normal with good balance and coordination.  MSK:  Non tender with full range of motion and good stability and symmetric strength and tone of shoulders, elbows, wrist, hip, knee and ankles bilaterally.  Back Exam:  Inspection: Unremarkable  Motion: Flexion 45 deg Extension 25 deg, Side Bending to 35 deg bilaterally,  Rotation to 45 deg bilaterally  SLR laying: Negative straight leg test  XSLR laying: Negative Palpable tenderness:  Increasing tenderness of the right sacroiliac joint FABER: positive on the right side Sensory change: Gross sensation intact to all lumbar and sacral dermatomes.  Reflexes: 2+ at both patellar tendons, 2+ at achilles tendons, Babinski's downgoing.  Strength at foot  Plantar-flexion:  5/5 Dorsi-flexion: 5/5 Eversion: 5/5 Inversion: 5/5  Leg strength  Quad: 5/5 Hamstring: 5/5\symmetric with improvement  Hip flexor: 5/5 Hip abductors: 4/5 seems stable with no improvement Gait unremarkable.  Osteopathic findings T3 extended rotated and side bent left with inhaled third rib T5 extended rotated and side bent right  L2 flexed rotated and side  bent left Sacrum right on right right anterior ilium   Impression and Recommendations:     This case required medical decision making of moderate complexity.

## 2016-02-02 NOTE — Patient Instructions (Addendum)
Good to see you  Ice 20 minutes 2 times daily. Usually after activity and before bed. Duexis 2 times a day for your trip to avoid pain  Continue the turmeric Zanaflex at night with sleeping but you know what to be careful with  Iron 65mg  daily WITH 500mg  of Vitamin C.  Maybe have an appointment wed in case you want the injection before you go in the SI joint.  Have Fun!

## 2016-02-02 NOTE — Assessment & Plan Note (Signed)
Decision today to treat with OMT was based on Physical Exam  After verbal consent patient was treated with HVLA, ME, FPR techniques in thoracic, lumbar and sacral areas  Patient tolerated the procedure well with improvement in symptoms  Patient given exercises, stretches and lifestyle modifications  See medications in patient instructions if given  Patient will follow up in 1 weeks

## 2016-02-08 ENCOUNTER — Ambulatory Visit (INDEPENDENT_AMBULATORY_CARE_PROVIDER_SITE_OTHER): Payer: 59 | Admitting: Family Medicine

## 2016-02-08 VITALS — BP 124/80 | HR 60 | Ht 69.0 in | Wt 178.0 lb

## 2016-02-08 DIAGNOSIS — M9902 Segmental and somatic dysfunction of thoracic region: Secondary | ICD-10-CM | POA: Diagnosis not present

## 2016-02-08 DIAGNOSIS — M9904 Segmental and somatic dysfunction of sacral region: Secondary | ICD-10-CM | POA: Diagnosis not present

## 2016-02-08 DIAGNOSIS — M533 Sacrococcygeal disorders, not elsewhere classified: Secondary | ICD-10-CM

## 2016-02-08 DIAGNOSIS — M9903 Segmental and somatic dysfunction of lumbar region: Secondary | ICD-10-CM

## 2016-02-08 DIAGNOSIS — M999 Biomechanical lesion, unspecified: Secondary | ICD-10-CM

## 2016-02-08 NOTE — Progress Notes (Signed)
Pre visit review using our clinic review tool, if applicable. No additional management support is needed unless otherwise documented below in the visit note. 

## 2016-02-08 NOTE — Assessment & Plan Note (Signed)
Patient's neck spasm seems to be somewhat better. Encourage her to continue with the exercises. She will be out of town for the next 4 days ago. Gave her some different stability [place] what activities to do a and which ones to avoid. Change in position the ring 20-30 minutes. Patient come back and see me again in 2-3 weeks for further evaluation and treatment.

## 2016-02-08 NOTE — [facility] Notes (Signed)
[name] [place], [place] [address] Phone: [phone] Subjective:    CC: back pain follow-up.   [id number] Shelby [name] is a 40 y.o. female coming in with complaint of back pain.patient did have an acute lumbar radiculopathy multiple months ago.  Since then she's been treated more for an sacroiliac joint dysfunction. Was having a flare last week. Patient was feeling better so she played tennis 3 days in a row and then did yoga for 2 days in a row. Starting to have increasing pain over the right sacroiliac joint again. No radiation of pain. Feels that it is very painful. Able to workout but not able to do daily home activities.    Past Medical History  Diagnosis Date  . GERD (gastroesophageal reflux disease)   . Anxiety       Past Medical History  Diagnosis Date  . GERD (gastroesophageal reflux disease)   . Anxiety    Past Surgical History  Procedure Laterality Date  . Cyst remove  12/2009  . Intrauterine device insertion  12/2009   Social History  Substance Use Topics  . Smoking status: Never Smoker   . 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 Alcohol Use: Yes   No Known Allergies Family History  Problem Relation Age of Onset  . Hyperlipidemia Mother   . Hypertension Mother   . Hypertension Father   . Bipolar disorder Sister     x 2  . Depression Mother    '  Past medical history, social, surgical and family history all reviewed in electronic medical record.   Review of Systems: No headache, visual changes, nausea, vomiting, diarrhea, constipation, dizziness, abdominal pain, skin rash, fevers, chills, night sweats, weight loss, swollen lymph nodes, body aches, joint swelling, muscle aches, chest pain, shortness of breath, mood changes.   Objective Blood pressure 124/80, pulse 60, height 5\' 9"  (1.753 m), weight 178 lb  (80.74 kg), SpO2 97 %.  General: patient is very uncomfortable HEENT: Pupils equal, extraocular movements intact patient's tender to palpation over the maxillary sinus on the right side. Respiratory: Patient's speak in full sentences and does not appear short of breath  Cardiovascular: No lower extremity edema, non tender, no erythema  Skin: Warm dry intact with no signs of infection or rash on extremities or on axial skeleton.  Abdomen: Soft nontender  Neuro: Cranial nerves II through XII are intact, neurovascularly intact in all extremities with 2+ DTRs and 2+ pulses.  Lymph: No lymphadenopathy of posterior or anterior cervical chain or axillae bilaterally.  Gait normal with good balance and coordination.  MSK:  Non tender with full range of motion and good stability and symmetric strength and tone of shoulders, elbows, wrist, hip, knee and ankles bilaterally.  Back Exam:  Inspection: Unremarkable  Motion: Flexion 45 deg Extension 25 deg, Side Bending to 35 deg bilaterally,  Rotation to 45 deg bilaterally  SLR laying: Negative straight leg test  XSLR laying: Negative Palpable tenderness:  Continued tenderness of the right sacroiliac joint FABER: positive on the right side Sensory change: Gross sensation intact to all lumbar and sacral dermatomes.  Reflexes: 2+ at both patellar tendons, 2+ at achilles tendons, Babinski's downgoing.  Strength at foot  Plantar-flexion: 5/5 Dorsi-flexion: 5/5 Eversion: 5/5 Inversion: 5/5  Leg strength  Quad: 5/5 Hamstring: 5/5\symmetric with improvement  Hip flexor: 5/5 Hip abductors: 4/5 seems stable with no improvement Gait unremarkable.  Osteopathic findings T3 extended rotated and side bent left with inhaled third rib  L2 flexed rotated and side bent left Sacrum right on right Ileum neutral   Impression and Recommendations:     This case required medical decision making of moderate complexity.

## 2016-02-08 NOTE — Assessment & Plan Note (Signed)
Decision today to treat with OMT was based on Physical Exam  After verbal consent patient was treated with HVLA, ME, FPR techniques in  thoracic, lumbar and sacral areas  Patient tolerated the procedure well with improvement in symptoms  Patient given exercises, stretches and lifestyle modifications  See medications in patient instructions if given  Patient will follow up in 2-3 weeks 

## 2016-02-08 NOTE — Patient Instructions (Addendum)
To help your back you must wear the biggest hat you can find.   Sombrero or bigger.  Duexis 3 times a day for 3 days if pain worsens. Or Vimovo 2 times a day for 6 days Take zanaflex at night if you need it.  Continue the vitamins if you can to help with the spasm.  Only NSAID or turmeric lets not do both.  Or consider flax, chia and hemp seeds for omega 3s See me again in 2-3 weeks.

## 2016-03-06 MED FILL — GABAPENTIN 100 MG CAPSULE: 100 | 30 days supply | Qty: 30 | Fill #1

## 2016-03-16 MED FILL — tiZANidine HCL 4 MG TABS: 4 | 30 days supply | Qty: 30 | Fill #1

## 2016-03-19 DIAGNOSIS — F411 Generalized anxiety disorder: Secondary | ICD-10-CM | POA: Diagnosis not present

## 2016-04-01 ENCOUNTER — Encounter: Payer: Self-pay | Admitting: Family Medicine

## 2016-04-02 ENCOUNTER — Encounter: Payer: Self-pay | Admitting: Family Medicine

## 2016-04-03 ENCOUNTER — Encounter: Payer: Self-pay | Admitting: Family Medicine

## 2016-04-03 ENCOUNTER — Ambulatory Visit (INDEPENDENT_AMBULATORY_CARE_PROVIDER_SITE_OTHER): Payer: 59 | Admitting: Family Medicine

## 2016-04-03 VITALS — BP 118/82 | HR 60 | Ht 69.0 in

## 2016-04-03 DIAGNOSIS — M999 Biomechanical lesion, unspecified: Secondary | ICD-10-CM

## 2016-04-03 DIAGNOSIS — M9903 Segmental and somatic dysfunction of lumbar region: Secondary | ICD-10-CM | POA: Diagnosis not present

## 2016-04-03 DIAGNOSIS — M9902 Segmental and somatic dysfunction of thoracic region: Secondary | ICD-10-CM | POA: Diagnosis not present

## 2016-04-03 DIAGNOSIS — M533 Sacrococcygeal disorders, not elsewhere classified: Secondary | ICD-10-CM | POA: Diagnosis not present

## 2016-04-03 DIAGNOSIS — M9904 Segmental and somatic dysfunction of sacral region: Secondary | ICD-10-CM | POA: Diagnosis not present

## 2016-04-03 NOTE — [facility] Notes (Signed)
[name] [place], [place] [address] Phone: [phone] Subjective:    CC: back pain follow-up.   [id number] LISABELLA PAO is a 40 y.o. female coming in with complaint of back pain.patient did have an acute lumbar radiculopathy multiple months ago.   Patient has sacroiliac dysfunction. Patient states that she has not been as active. Having worsening symptoms more of the lower back. Seems to be more on the right side. No radiation down the leg. Seems to be very localized. Denies any numbness or tingling. Patient though states that it is more of a soreness. Is taking the muscle relaxer nightly. Has been helping.   Past Medical History  Diagnosis Date  . GERD (gastroesophageal reflux disease)   . Anxiety       Past Medical History  Diagnosis Date  . GERD (gastroesophageal reflux disease)   . Anxiety    Past Surgical History  Procedure Laterality Date  . Cyst remove  12/2009  . Intrauterine device insertion  12/2009   Social History  Substance Use Topics  . Smoking status: Never Smoker   . 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 Alcohol Use: Yes   No Known Allergies Family History  Problem Relation Age of Onset  . Hyperlipidemia Mother   . Hypertension Mother   . Hypertension Father   . Bipolar disorder Sister     x 2  . Depression Mother    '  Past medical history, social, surgical and family history all reviewed in electronic medical record.   Review of Systems: No headache, visual changes, nausea, vomiting, diarrhea, constipation, dizziness, abdominal pain, skin rash, fevers, chills, night sweats, weight loss, swollen lymph nodes, body aches, joint swelling, muscle aches, chest pain, shortness of breath, mood changes.   Objective Blood pressure 118/82, pulse 60, height 5\' 9"  (1.753 m).  General: patient is very  uncomfortable HEENT: Pupils equal, extraocular movements intact patient's tender to palpation over the maxillary sinus on the right side. Respiratory: Patient's speak in full sentences and does not appear short of breath  Cardiovascular: No lower extremity edema, non tender, no erythema  Skin: Warm dry intact with no signs of infection or rash on extremities or on axial skeleton.  Abdomen: Soft nontender  Neuro: Cranial nerves II through XII are intact, neurovascularly intact in all extremities with 2+ DTRs and 2+ pulses.  Lymph: No lymphadenopathy of posterior or anterior cervical chain or axillae bilaterally.  Gait normal with good balance and coordination.  MSK:  Non tender with full range of motion and good stability and symmetric strength and tone of shoulders, elbows, wrist, hip, knee and ankles bilaterally.  Back Exam:  Inspection: Unremarkable  Motion: Flexion 45 deg Extension 25 deg, Side Bending to 35 deg bilaterally,  Rotation to 45 deg bilaterally  SLR laying: Negative straight leg test  XSLR laying: Negative Palpable tenderness:  Continued tenderness of the right sacroiliac joint FABER: positive on the right side worse then previous exam.  Sensory change: Gross sensation intact to all lumbar and sacral dermatomes.  Reflexes: 2+ at both patellar tendons, 2+ at achilles tendons, Babinski's downgoing.  Strength at foot  Plantar-flexion: 5/5 Dorsi-flexion: 5/5 Eversion: 5/5 Inversion: 5/5  Leg strength  Quad: 5/5 Hamstring: 5/5\symmetric  Hip flexor: 5/5 Hip abductors: 4/5 seems stable with mild increase in weakness Gait unremarkable.  Osteopathic findings T3 extended rotated and side bent left with inhaled third rib L2 flexed rotated and side bent left Sacrum right on right Otic shear noted   Impression and Recommendations:     This case required medical decision making of moderate complexity.

## 2016-04-03 NOTE — Assessment & Plan Note (Signed)
Worsening symptoms today. Patient can continue the muscle relaxer. Patient has tried not to take the anti-inflammatories on a regular basis but we discussed how this can be beneficial. Encourage patient to work on core strengthening. Patient will follow-up with me more on a regular basis every 4-6 weeks.

## 2016-04-03 NOTE — Patient Instructions (Signed)
4-6 weeks

## 2016-04-03 NOTE — Assessment & Plan Note (Signed)
Decision today to treat with OMT was based on Physical Exam  After verbal consent patient was treated with HVLA, ME, FPR techniques in  thoracic, lumbar and sacral areas  Patient tolerated the procedure well with improvement in symptoms  Patient given exercises, stretches and lifestyle modifications  See medications in patient instructions if given  Patient will follow up in 4-6 weeks 

## 2016-04-03 NOTE — Progress Notes (Signed)
Pre visit review using our clinic review tool, if applicable. No additional management support is needed unless otherwise documented below in the visit note. 

## 2016-05-01 NOTE — [facility] Notes (Signed)
[name] [place] [place], [place] [address] Phone: [phone] Subjective:    CC: back pain follow-up.   [id number]  Shelby [name] is a 40 y.o. female coming in with complaint of back pain.patient did have an acute lumbar radiculopathy multiple months ago.   Patient has sacroiliac dysfunction. Patient states that she has not been as active. Did have an exacerbation last follow-up 3 weeks ago. Patient has been doing more activities overall. Patient states has not been doing yoga as much. Patient has had kids at home. An states that she is doing relatively well. States that it does not seem to have as much pain. Some mild tightness but no pain.  Past Medical History:  Diagnosis Date  . Anxiety   . GERD (gastroesophageal reflux disease)       Past Medical History:  Diagnosis Date  . Anxiety   . GERD (gastroesophageal reflux disease)    Past Surgical History:  Procedure Laterality Date  . Cyst remove  12/2009  . INTRAUTERINE DEVICE INSERTION  12/2009   Social History  Substance Use Topics  . Smoking status: Never Smoker  . 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 Alcohol use Yes   No Known Allergies Family History  Problem Relation Age of Onset  . Hyperlipidemia Mother   . Hypertension Mother   . Hypertension Father   . Bipolar disorder Sister     x 2  . Depression Mother    '  Past medical history, social, surgical and family history all reviewed in electronic medical record.   Review of Systems: No headache, visual changes, nausea, vomiting, diarrhea, constipation, dizziness, abdominal pain, skin rash, fevers, chills, night sweats, weight loss, swollen lymph nodes, body aches, joint swelling, muscle aches, chest pain, shortness of breath, mood changes.   Objective  Blood pressure 112/70, pulse (!) 57, weight 175 lb (79.4 kg), SpO2  99 %.  General: patient is very uncomfortable HEENT: Pupils equal, extraocular movements intact patient's tender to palpation over the maxillary sinus on the right side. Respiratory: Patient's speak in full sentences and does not appear short of breath  Cardiovascular: No lower extremity edema, non tender, no erythema  Skin: Warm dry intact with no signs of infection or rash on extremities or on axial skeleton.  Abdomen: Soft nontender  Neuro: Cranial nerves II through XII are intact, neurovascularly intact in all extremities with 2+ DTRs and 2+ pulses.  Lymph: No lymphadenopathy of posterior or anterior cervical chain or axillae bilaterally.  Gait normal with good balance and coordination.  MSK:  Non tender with full range of motion and good stability and symmetric strength and tone of shoulders, elbows, wrist, hip, knee and ankles bilaterally.  Back Exam:  Inspection: Unremarkable  Motion: Flexion 45 deg Extension 30 deg, Side Bending to 35 deg bilaterally,  Rotation to 45 deg bilaterally  SLR laying: Negative straight leg test  XSLR laying: Negative Palpable tenderness: tender right sacroiliac joint  FABER: positive on the right side  Sensory change: Gross sensation intact to all lumbar and sacral dermatomes.  Reflexes: 2+ at both patellar tendons, 2+ at achilles tendons, Babinski's downgoing.  Strength at foot  Plantar-flexion: 5/5 Dorsi-flexion: 5/5 Eversion: 5/5 Inversion: 5/5  Leg strength  Quad: 5/5 Hamstring: 5/5\symmetric  Hip flexor: 5/5 Hip abductors: 4/5 seems stable with mild increase  in weakness Gait unremarkable.  Osteopathic findings T3 extended rotated and side bent left with inhaled third rib L2 flexed rotated and side bent left Sacrum right on right    Impression and Recommendations:     This case required medical decision making of moderate complexity.

## 2016-05-02 ENCOUNTER — Ambulatory Visit (INDEPENDENT_AMBULATORY_CARE_PROVIDER_SITE_OTHER): Payer: 59 | Admitting: Family Medicine

## 2016-05-02 ENCOUNTER — Encounter: Payer: Self-pay | Admitting: Family Medicine

## 2016-05-02 VITALS — BP 112/70 | HR 57 | Wt 175.0 lb

## 2016-05-02 DIAGNOSIS — M9902 Segmental and somatic dysfunction of thoracic region: Secondary | ICD-10-CM | POA: Diagnosis not present

## 2016-05-02 DIAGNOSIS — M9903 Segmental and somatic dysfunction of lumbar region: Secondary | ICD-10-CM | POA: Diagnosis not present

## 2016-05-02 DIAGNOSIS — M533 Sacrococcygeal disorders, not elsewhere classified: Secondary | ICD-10-CM | POA: Diagnosis not present

## 2016-05-02 DIAGNOSIS — M9904 Segmental and somatic dysfunction of sacral region: Secondary | ICD-10-CM | POA: Diagnosis not present

## 2016-05-02 DIAGNOSIS — M999 Biomechanical lesion, unspecified: Secondary | ICD-10-CM

## 2016-05-02 NOTE — Assessment & Plan Note (Signed)
Patient doesn't more of a sacroiliac joint dysfunction. Patient has been doing relatively well. Some improvement from previous exam. Very similar presentation to her baseline. Patient has a muscle relaxer if needed. We discussed with patient to continue the over-the-counter medications. Patient come back and see me again in 6 weeks for further evaluation and treatment.

## 2016-05-02 NOTE — Progress Notes (Signed)
Pre visit review using our clinic review tool, if applicable. No additional management support is needed unless otherwise documented below in the visit note. 

## 2016-05-02 NOTE — Patient Instructions (Signed)
6 weeks

## 2016-05-09 ENCOUNTER — Encounter: Payer: Self-pay | Admitting: Family Medicine

## 2016-05-10 ENCOUNTER — Encounter: Payer: Self-pay | Admitting: Family Medicine

## 2016-05-10 ENCOUNTER — Other Ambulatory Visit (INDEPENDENT_AMBULATORY_CARE_PROVIDER_SITE_OTHER): Payer: 59

## 2016-05-10 ENCOUNTER — Ambulatory Visit (INDEPENDENT_AMBULATORY_CARE_PROVIDER_SITE_OTHER): Payer: 59 | Admitting: Family Medicine

## 2016-05-10 VITALS — BP 122/84 | HR 63 | Ht 69.5 in | Wt 177.0 lb

## 2016-05-10 DIAGNOSIS — M9903 Segmental and somatic dysfunction of lumbar region: Secondary | ICD-10-CM

## 2016-05-10 DIAGNOSIS — M9904 Segmental and somatic dysfunction of sacral region: Secondary | ICD-10-CM

## 2016-05-10 DIAGNOSIS — M533 Sacrococcygeal disorders, not elsewhere classified: Secondary | ICD-10-CM

## 2016-05-10 DIAGNOSIS — M9902 Segmental and somatic dysfunction of thoracic region: Secondary | ICD-10-CM

## 2016-05-10 DIAGNOSIS — M999 Biomechanical lesion, unspecified: Secondary | ICD-10-CM

## 2016-05-10 DIAGNOSIS — M2550 Pain in unspecified joint: Secondary | ICD-10-CM

## 2016-05-10 LAB — CBC WITH DIFFERENTIAL/PLATELET
BASOS ABS: 0 10*3/uL (ref 0.0–0.1)
Basophils Relative: 0.8 % (ref 0.0–3.0)
EOS PCT: 3.8 % (ref 0.0–5.0)
Eosinophils Absolute: 0.2 10*3/uL (ref 0.0–0.7)
HCT: 39.2 % (ref 36.0–46.0)
Hemoglobin: 13.6 g/dL (ref 12.0–15.0)
LYMPHS ABS: 1.7 10*3/uL (ref 0.7–4.0)
Lymphocytes Relative: 36.2 % (ref 12.0–46.0)
MCHC: 34.8 g/dL (ref 30.0–36.0)
MCV: 94.2 fl (ref 78.0–100.0)
MONOS PCT: 8 % (ref 3.0–12.0)
Monocytes Absolute: 0.4 10*3/uL (ref 0.1–1.0)
NEUTROS PCT: 51.2 % (ref 43.0–77.0)
Neutro Abs: 2.4 10*3/uL (ref 1.4–7.7)
PLATELETS: 217 10*3/uL (ref 150.0–400.0)
RBC: 4.17 Mil/uL (ref 3.87–5.11)
RDW: 12.7 % (ref 11.5–15.5)
WBC: 4.6 10*3/uL (ref 4.0–10.5)

## 2016-05-10 LAB — TSH: TSH: 1.08 u[IU]/mL (ref 0.35–4.50)

## 2016-05-10 LAB — SEDIMENTATION RATE: Sed Rate: 1 mm/hr (ref 0–20)

## 2016-05-10 LAB — IBC PANEL
IRON: 85 ug/dL (ref 42–145)
SATURATION RATIOS: 22.4 % (ref 20.0–50.0)
Transferrin: 271 mg/dL (ref 212.0–360.0)

## 2016-05-10 LAB — VITAMIN D 25 HYDROXY (VIT D DEFICIENCY, FRACTURES): VITD: 37.46 ng/mL (ref 30.00–100.00)

## 2016-05-10 LAB — FERRITIN: FERRITIN: 68.5 ng/mL (ref 10.0–291.0)

## 2016-05-10 LAB — C-REACTIVE PROTEIN

## 2016-05-10 MED ORDER — PREDNISONE 50 MG PO TABS: 50.0000 mg | ORAL_TABLET | Freq: Every day | ORAL | 0 refills | Status: DC

## 2016-05-10 MED ORDER — VENLAFAXINE HCL ER 37.5 MG PO CP24: 37.5000 mg | ORAL_CAPSULE | Freq: Every day | ORAL | 1 refills | Status: DC

## 2016-05-10 MED FILL — VENLAFAXINE HCL ER 37.5 MG: 37.5 | 30 days supply | Qty: 30 | Fill #0

## 2016-05-10 MED FILL — predniSONE 50 MG TABS: 50 | 5 days supply | Qty: 5 | Fill #0

## 2016-05-10 NOTE — Assessment & Plan Note (Addendum)
Decision today to treat with OMT was based on Physical Exam  After verbal consent patient was treated with HVLA, ME, FPR techniques in  thoracic, lumbar and sacral areas  Patient tolerated the procedure well with improvement in symptoms  Patient given exercises, stretches and lifestyle modifications  See medications in patient instructions if given  Patient will follow up in 2-3 weeks 

## 2016-05-10 NOTE — Patient Instructions (Addendum)
God to see you  We will get labs today  Consider effexor 37.5 mg daily I really think it could be helpful Ice after any activity  Prednisone daily for 5 days if it hurts See me again in 2-3 weeks.

## 2016-05-10 NOTE — Progress Notes (Signed)
[name] [address] [place], [place] [address] Phone: [phone] Subjective:    CC: back pain follow-up.   [id number]  Shelby [name] is a 40 y.o. female coming in with complaint of back pain.patient did have an acute lumbar radiculopathy multiple months ago.   Patient has sacroiliac dysfunction. Patient is having worsening symptoms. Feels that more she sits [place] gets. Patient states that even when she does yoga she is having more pain. Still on the low aspect. Mild radiation down the leg. Patient denies any numbness or tingling that is continuous. Patient denies any weakness. He is planning on playing tennis he can later today. Patient is wondering what else can be done though because she is frustrated at the chronic irritation in the area.  Patient's previous imaging showed some mild facet hypertrophy as well as mild degenerative disc disease at L5-S1 these were taken 03/28/2015 and were independently visualized by me.  Past Medical History:  Diagnosis Date  . Anxiety   . GERD (gastroesophageal reflux disease)       Past Medical History:  Diagnosis Date  . Anxiety   . GERD (gastroesophageal reflux disease)    Past Surgical History:  Procedure Laterality Date  . Cyst remove  12/2009  . INTRAUTERINE DEVICE INSERTION  12/2009   Social History  Substance Use Topics  . Smoking status: Never Smoker  . Smokeless tobacco: Not on file     Comment: married, lives with spouse (GI doc at [facility]) and 2 kids - previous HS history teacher x 12yr  . Alcohol use Yes   No Known Allergies Family History  Problem Relation Age of Onset  . Hyperlipidemia Mother   . Hypertension Mother   . Hypertension Father   . Bipolar disorder Sister     x 2  . Depression Mother    '  Past medical history, social, surgical and family history all reviewed in electronic medical record.   Review of Systems: No headache, visual changes, nausea, vomiting, diarrhea,  constipation, dizziness, abdominal pain, skin rash, fevers, chills, night sweats, weight loss, swollen lymph nodes, body aches, joint swelling, muscle aches, chest pain, shortness of breath, mood changes.   Objective  There were no vitals taken for this visit.  General: patient is very uncomfortable HEENT: Pupils equal, extraocular movements intact patient's tender to palpation over the maxillary sinus on the right side. Respiratory: Patient's speak in full sentences and does not appear short of breath  Cardiovascular: No lower extremity edema, non tender, no erythema  Skin: Warm dry intact with no signs of infection or rash on extremities or on axial skeleton.  Abdomen: Soft nontender  Neuro: Cranial nerves II through XII are intact, neurovascularly intact in all extremities with 2+ DTRs and 2+ pulses.  Lymph: No lymphadenopathy of posterior or anterior cervical chain or axillae bilaterally.  Gait normal with good balance and coordination.  MSK:  Non tender with full range of motion and good stability and symmetric strength and tone of shoulders, elbows, wrist, hip, knee and ankles bilaterally.  Back Exam:  Inspection: Unremarkable  Motion: Flexion 35 deg Extension 20 deg, Side Bending to 35 deg bilaterally,  Rotation to 30 deg bilaterally  SLR laying: Negative straight leg test  XSLR laying: Negative Palpable tenderness: tender right sacroiliac joint worse than previous exam as well as some tenderness in the paraspinal musculature of the right-sided lumbar spine FABER: positive on the right side  Sensory change: Gross sensation intact  to all lumbar and sacral dermatomes.  Reflexes: 2+ at both patellar tendons, 2+ at achilles tendons, Babinski's downgoing.  Strength at foot  Plantar-flexion: 5/5 Dorsi-flexion: 5/5 Eversion: 5/5 Inversion: 5/5  Leg strength  Quad: 5/5 Hamstring: 5/5\symmetric  Hip flexor: 5/5 Hip abductors: 4/5 seems stable with mild increase in weakness again noted Gait  unremarkable.  Osteopathic findings T3 extended rotated and side bent left with inhaled third rib T7 extended rotated and side bent right L2 flexed rotated and side bent left Sacrum right on right    Impression and Recommendations:     This case required medical decision making of moderate complexity.

## 2016-05-10 NOTE — Assessment & Plan Note (Signed)
Discussed with patient again at great length. Patient is having more of an arthralgia type pain. Does not seem to be responding as well to the conservative therapy and I do feel that laboratory workup including an vitamin deficiencies as well as autoimmune could be beneficial. We also discussed icing regimen, home exercises, patient given a prescription for Effexor but is not seen on taking medications regularly. Patient is going out of town and given prednisone in case she has a flare. Patient will follow-up with me quickly in 2-3 weeks for further evaluation and treatment.

## 2016-05-11 LAB — ANA: Anti Nuclear Antibody(ANA): NEGATIVE

## 2016-05-17 ENCOUNTER — Encounter: Payer: Self-pay | Admitting: Family Medicine

## 2016-05-17 ENCOUNTER — Other Ambulatory Visit: Payer: Self-pay | Admitting: *Deleted

## 2016-05-17 DIAGNOSIS — M5416 Radiculopathy, lumbar region: Secondary | ICD-10-CM

## 2016-05-18 ENCOUNTER — Encounter: Payer: Self-pay | Admitting: Family Medicine

## 2016-05-18 ENCOUNTER — Telehealth: Payer: Self-pay | Admitting: Family Medicine

## 2016-05-18 ENCOUNTER — Telehealth: Payer: Self-pay | Admitting: Emergency Medicine

## 2016-05-18 DIAGNOSIS — Z8742 Personal history of other diseases of the female genital tract: Secondary | ICD-10-CM

## 2016-05-18 MED ORDER — TRAMADOL HCL 50 MG PO TABS: 50.0000 mg | ORAL_TABLET | Freq: Three times a day (TID) | ORAL | 0 refills | Status: DC | PRN

## 2016-05-18 MED FILL — traMADol HCL 50 MG TABS: 50 | 10 days supply | Qty: 30 | Fill #0

## 2016-05-18 NOTE — Telephone Encounter (Signed)
RX faxed to [facility] 

## 2016-05-18 NOTE — Telephone Encounter (Signed)
Discussed with patient as well as her husband who is a physician. We discussed the patient's pain is worsening. Patient is awaiting MRI at this time. We are going to add and pelvic MRI as well as patient having a past medical history significant for an ovarian cyst that didn't need surgical intervention. Patient states that this pain is different than I do feel that further evaluation with patient making significant worsening strides even with her on prednisone has been difficult. Patient was given a refill of tramadol today. Hopefully that this will make more of a benefit.

## 2016-05-19 ENCOUNTER — Ambulatory Visit (HOSPITAL_COMMUNITY)
Admission: RE | Admit: 2016-05-19 | Discharge: 2016-05-19 | Disposition: A | Discharge status: Home / Self Care | Payer: 59 | Source: Ambulatory Visit | Attending: Family Medicine | Admitting: Family Medicine

## 2016-05-19 DIAGNOSIS — M5126 Other intervertebral disc displacement, lumbar region: Secondary | ICD-10-CM | POA: Diagnosis not present

## 2016-05-19 DIAGNOSIS — M5136 Other intervertebral disc degeneration, lumbar region: Secondary | ICD-10-CM | POA: Diagnosis not present

## 2016-05-19 DIAGNOSIS — M47816 Spondylosis without myelopathy or radiculopathy, lumbar region: Secondary | ICD-10-CM | POA: Diagnosis not present

## 2016-05-19 DIAGNOSIS — R609 Edema, unspecified: Secondary | ICD-10-CM | POA: Insufficient documentation

## 2016-05-19 DIAGNOSIS — Z8742 Personal history of other diseases of the female genital tract: Secondary | ICD-10-CM | POA: Insufficient documentation

## 2016-05-19 DIAGNOSIS — Z975 Presence of (intrauterine) contraceptive device: Secondary | ICD-10-CM | POA: Diagnosis not present

## 2016-05-19 DIAGNOSIS — M5416 Radiculopathy, lumbar region: Secondary | ICD-10-CM

## 2016-05-19 DIAGNOSIS — R102 Pelvic and perineal pain: Secondary | ICD-10-CM | POA: Diagnosis not present

## 2016-05-20 ENCOUNTER — Other Ambulatory Visit: Payer: Self-pay | Admitting: Family Medicine

## 2016-05-20 MED ORDER — VITAMIN D (ERGOCALCIFEROL) 1.25 MG (50000 UNIT) PO CAPS: 50000.0000 [IU] | ORAL_CAPSULE | ORAL | 0 refills | Status: DC

## 2016-05-20 NOTE — Progress Notes (Signed)
Sent in once weekly Vit D due to findings on MRI

## 2016-05-21 ENCOUNTER — Other Ambulatory Visit: Payer: Self-pay | Admitting: *Deleted

## 2016-05-21 DIAGNOSIS — M5416 Radiculopathy, lumbar region: Secondary | ICD-10-CM

## 2016-05-21 MED FILL — tiZANidine HCL 4 MG TABS: 4 | 30 days supply | Qty: 30 | Fill #2

## 2016-05-21 MED FILL — VIT D2 1.25 MG (50,000 UNIT: 1.25 MG | 84 days supply | Qty: 12 | Fill #0

## 2016-05-23 ENCOUNTER — Ambulatory Visit
Admission: RE | Admit: 2016-05-23 | Discharge: 2016-05-23 | Disposition: A | Discharge status: Home / Self Care | Payer: 59 | Source: Ambulatory Visit | Attending: Family Medicine | Admitting: Family Medicine

## 2016-05-23 DIAGNOSIS — M5126 Other intervertebral disc displacement, lumbar region: Secondary | ICD-10-CM | POA: Diagnosis not present

## 2016-05-23 DIAGNOSIS — M5416 Radiculopathy, lumbar region: Secondary | ICD-10-CM

## 2016-05-23 MED ORDER — IOPAMIDOL (ISOVUE-M 200) INJECTION 41%
1.0000 mL | Freq: Once | INTRAMUSCULAR | Status: AC
  Administered 2016-05-23: 1 mL via EPIDURAL

## 2016-05-23 MED ORDER — METHYLPREDNISOLONE ACETATE 40 MG/ML INJ SUSP (RADIOLOG
120.0000 mg | Freq: Once | INTRAMUSCULAR | Status: AC
  Administered 2016-05-23: 120 mg via EPIDURAL

## 2016-05-23 NOTE — Discharge Instructions (Signed)

## 2016-05-24 NOTE — Telephone Encounter (Signed)
MRI ordered. Per Dr Darliss Ridgel request.

## 2016-05-30 ENCOUNTER — Other Ambulatory Visit: Payer: 59

## 2016-06-07 NOTE — Progress Notes (Signed)
[name] [place], [place] [address] Phone: [phone] Subjective:    CC: back pain follow-up.   [id number]  Shelby [name] is a 40 y.o. female coming in with complaint of back pain.patient did have an acute lumbar radiculopathy multiple months ago.   Patient has sacroiliac dysfunction. Patient is having worsening symptoms. Patient was having radicular symptoms as well. Seem to be intermittent. Pain is worsening nonpitting controlled with prednisone. Patient did have an MRI. MRI showed mild lumbar disc degeneration at L4-S1 as well as a small right paracentral disc herniation causing compression of the right L5 nerve root. Patient then on 05/23/2016 had a epidural fat L4-L5. Patient states Significant improvement at this time. Patient states that this is the first time where she has been pain-free for probably over a year. Patient states that she is feeling better but is concerned about starting any other activities at this time. Patient is wondering what else she can possibly do. Patient was also started on Effexor no side effects but states that sometimes it feels like her heart is going a little faster. Other than that feels like she is doing well.  Patient's previous imaging showed some mild facet hypertrophy as well as mild degenerative disc disease at L5-S1 these were taken 03/28/2015 and were independently visualized by me.  Past Medical History:  Diagnosis Date  . Anxiety   . GERD (gastroesophageal reflux disease)       Past Medical History:  Diagnosis Date  . Anxiety   . GERD (gastroesophageal reflux disease)    Past Surgical History:  Procedure Laterality Date  . Cyst remove  12/2009  . INTRAUTERINE DEVICE INSERTION  12/2009   Social History  Substance Use Topics  . Smoking status: Never Smoker  . Smokeless tobacco: Not on file     Comment: married, lives with spouse (GI doc at [facility]) and 2 kids - previous HS history  teacher x 35yr  . Alcohol use Yes   No Known Allergies Family History  Problem Relation Age of Onset  . Hyperlipidemia Mother   . Hypertension Mother   . Hypertension Father   . Bipolar disorder Sister     x 2  . Depression Mother    '  Past medical history, social, surgical and family history all reviewed in electronic medical record.   Review of Systems: No headache, visual changes, nausea, vomiting, diarrhea, constipation, dizziness, abdominal pain, skin rash, fevers, chills, night sweats, weight loss, swollen lymph nodes, body aches, joint swelling, muscle aches, chest pain, shortness of breath, mood changes.   Objective  Last menstrual period 04/27/2016.  General: patient is very uncomfortable HEENT: Pupils equal, extraocular movements intact patient's tender to palpation over the maxillary sinus on the right side. Respiratory: Patient's speak in full sentences and does not appear short of breath  Cardiovascular: No lower extremity edema, non tender, no erythema  Skin: Warm dry intact with no signs of infection or rash on extremities or on axial skeleton.  Abdomen: Soft nontender  Neuro: Cranial nerves II through XII are intact, neurovascularly intact in all extremities with 2+ DTRs and 2+ pulses.  Lymph: No lymphadenopathy of posterior or anterior cervical chain or axillae bilaterally.  Gait normal with good balance and coordination.  MSK:  Non tender with full range of motion and good stability and symmetric strength and tone of shoulders, elbows, wrist, hip, knee and ankles bilaterally.  Back Exam:  Inspection: Unremarkable  Motion: Flexion  35 deg Extension 20 deg, Side Bending to 35 deg bilaterally,  Rotation to 30 deg bilaterally  SLR laying: Negative straight leg test  XSLR laying: Negative Palpable tenderness:Nontender in exam today. Still some tightness on the paraspinal musculature of the lumbar spine. FABER: Tightness of the right side but no pain Sensory change:  Gross sensation intact to all lumbar and sacral dermatomes.  Reflexes: 2+ at both patellar tendons, 2+ at achilles tendons, Babinski's downgoing.  Strength at foot  Plantar-flexion: 5/5 Dorsi-flexion: 5/5 Eversion: 5/5 Inversion: 5/5  Leg strength  Quad: 5/5 Hamstring: 5/5\symmetric  Hip flexor: 5/5 Hip abductors: 4/5 seems stable with mild increase in weakness again noted Gait unremarkable.  Osteopathic findings T3 extended rotated and side bent left with inhaled third rib T7 extended rotated and side bent right     Impression and Recommendations:     This case required medical decision making of moderate complexity.

## 2016-06-08 ENCOUNTER — Encounter: Payer: Self-pay | Admitting: Family Medicine

## 2016-06-08 ENCOUNTER — Ambulatory Visit (INDEPENDENT_AMBULATORY_CARE_PROVIDER_SITE_OTHER): Payer: 59 | Admitting: Family Medicine

## 2016-06-08 DIAGNOSIS — M9902 Segmental and somatic dysfunction of thoracic region: Secondary | ICD-10-CM

## 2016-06-08 DIAGNOSIS — M5416 Radiculopathy, lumbar region: Secondary | ICD-10-CM

## 2016-06-08 DIAGNOSIS — M999 Biomechanical lesion, unspecified: Secondary | ICD-10-CM

## 2016-06-08 DIAGNOSIS — F419 Anxiety disorder, unspecified: Secondary | ICD-10-CM | POA: Diagnosis not present

## 2016-06-08 NOTE — Assessment & Plan Note (Signed)
Patient is on Effexor. Continued 37.5 mg. 4 weeks we'll discuss increasing to 75 mg

## 2016-06-08 NOTE — Assessment & Plan Note (Signed)
Patient's lumbar radiculopathy has completely resolved at this time. Likely was secondary to the nerve impingement. Responded well to the epidural. Patient elected to not have manipulation on this but only worked on the neck. We continued the Effexor at a low dose for another 4 weeks.

## 2016-06-08 NOTE — Assessment & Plan Note (Signed)
Decision today to treat with OMT was based on Physical Exam  After verbal consent patient was treated with HVLA, ME, FPR techniques in thoracic areas  Patient tolerated the procedure well with improvement in symptoms  Patient given exercises, stretches and lifestyle modifications  See medications in patient instructions if given  Patient will follow up in 3-4 weeks

## 2016-06-08 NOTE — Patient Instructions (Signed)
Good to see you I am so happy  Effexor lets continue See me again in 4 weeks.

## 2016-06-12 MED FILL — VENLAFAXINE HCL ER 37.5 MG: 37.5 | 30 days supply | Qty: 30 | Fill #1

## 2016-06-24 ENCOUNTER — Encounter: Payer: Self-pay | Admitting: Family Medicine

## 2016-06-25 MED ORDER — TIZANIDINE HCL 4 MG PO TABS: 4.0000 mg | ORAL_TABLET | Freq: Every evening | ORAL | 2 refills | Status: DC

## 2016-06-25 MED FILL — tiZANidine HCL 4 MG TABS: 4 | 30 days supply | Qty: 30 | Fill #0

## 2016-06-27 DIAGNOSIS — Z01419 Encounter for gynecological examination (general) (routine) without abnormal findings: Secondary | ICD-10-CM | POA: Diagnosis not present

## 2016-06-27 DIAGNOSIS — Z1212 Encounter for screening for malignant neoplasm of rectum: Secondary | ICD-10-CM | POA: Diagnosis not present

## 2016-06-27 DIAGNOSIS — Z6825 Body mass index (BMI) 25.0-25.9, adult: Secondary | ICD-10-CM | POA: Diagnosis not present

## 2016-07-05 NOTE — Progress Notes (Signed)
[name] [place], [place] [address] Phone: [phone] Subjective:    CC: back pain follow-up.   [id number]  Shelby [name] is a 40 y.o. female coming in with complaint of back pain.patient did have an acute lumbar radiculopathy multiple months ago.   Patient has sacroiliac dysfunction. Patient is having worsening symptoms. Patient was having radicular symptoms as well. Seem to be intermittent. Pain is worsening nonpitting controlled with prednisone. Patient did have an MRI. MRI showed mild lumbar disc degeneration at L4-S1 as well as a small right paracentral disc herniation causing compression of the right L5 nerve root. Patient then on 05/23/2016 had a epidural fat L4-L5.  Patient is been doing very well. Not having any pain whatsoever. Still some mild tightness she has noticed in the posterior buttocks region on the left side. Not stopping her from any activities and has started to increase her yoga recently. Patient denies any weakness or any new symptoms. Patient is looking for core strength.  Patient's previous imaging showed some mild facet hypertrophy as well as mild degenerative disc disease at L5-S1 these were taken 03/28/2015 and were independently visualized by me.  Past Medical History:  Diagnosis Date  . Anxiety   . GERD (gastroesophageal reflux disease)       Past Medical History:  Diagnosis Date  . Anxiety   . GERD (gastroesophageal reflux disease)    Past Surgical History:  Procedure Laterality Date  . Cyst remove  12/2009  . INTRAUTERINE DEVICE INSERTION  12/2009   Social History  Substance Use Topics  . Smoking status: Never Smoker  . Smokeless tobacco: Not on file     Comment: married, lives with spouse (GI doc at [facility]) and 2 kids - previous HS history teacher x 49yr  . Alcohol use Yes   No Known Allergies Family History  Problem Relation Age of Onset  . Hyperlipidemia Mother   . Hypertension Mother   .  Hypertension Father   . Bipolar disorder Sister     x 2  . Depression Mother    '  Past medical history, social, surgical and family history all reviewed in electronic medical record.   Review of Systems: No headache, visual changes, nausea, vomiting, diarrhea, constipation, dizziness, abdominal pain, skin rash, fevers, chills, night sweats, weight loss, swollen lymph nodes, body aches, joint swelling, muscle aches, chest pain, shortness of breath, mood changes.   Objective  Blood pressure 122/84, pulse 62, SpO2 96 %.  General: patient is very uncomfortable HEENT: Pupils equal, extraocular movements intact patient's tender to palpation over the maxillary sinus on the right side. Respiratory: Patient's speak in full sentences and does not appear short of breath  Cardiovascular: No lower extremity edema, non tender, no erythema  Skin: Warm dry intact with no signs of infection or rash on extremities or on axial skeleton.  Abdomen: Soft nontender  Neuro: Cranial nerves II through XII are intact, neurovascularly intact in all extremities with 2+ DTRs and 2+ pulses.  Lymph: No lymphadenopathy of posterior or anterior cervical chain or axillae bilaterally.  Gait normal with good balance and coordination.  MSK:  Non tender with full range of motion and good stability and symmetric strength and tone of shoulders, elbows, wrist, hip, knee and ankles bilaterally.  Back Exam:  Inspection: Unremarkable  Motion: Flexion 40 deg Extension 20 deg, Side Bending to 35 deg bilaterally,  Rotation to 35 deg bilaterally increasing range of motion SLR laying: Negative  straight leg test  XSLR laying: Negative Palpable tenderness:Nontender in exam today.  FABER: Tightness of the right side but no pain Sensory change: Gross sensation intact to all lumbar and sacral dermatomes.  Reflexes: 2+ at both patellar tendons, 2+ at achilles tendons, Babinski's downgoing.  Strength at foot  Plantar-flexion: 5/5  Dorsi-flexion: 5/5 Eversion: 5/5 Inversion: 5/5  Leg strength  Quad: 5/5 Hamstring: 5/5\symmetric . Mild tightness noted on the left side  Hip flexor: 5/5 Hip abductors: 4 out of 5 but symmetric Gait unremarkable.  Osteopathic findings C2 flexed rotated and side bent right T3 extended rotated and side bent left with inhaled third rib T7 extended rotated and side bent right L2 flexed rotated and side bent right    Impression and Recommendations:     This case required medical decision making of moderate complexity.

## 2016-07-06 ENCOUNTER — Encounter: Payer: Self-pay | Admitting: Family Medicine

## 2016-07-06 ENCOUNTER — Ambulatory Visit (INDEPENDENT_AMBULATORY_CARE_PROVIDER_SITE_OTHER): Payer: 59 | Admitting: Family Medicine

## 2016-07-06 VITALS — BP 122/84 | HR 62

## 2016-07-06 DIAGNOSIS — Z23 Encounter for immunization: Secondary | ICD-10-CM

## 2016-07-06 DIAGNOSIS — M5416 Radiculopathy, lumbar region: Secondary | ICD-10-CM | POA: Diagnosis not present

## 2016-07-06 DIAGNOSIS — M9902 Segmental and somatic dysfunction of thoracic region: Secondary | ICD-10-CM | POA: Diagnosis not present

## 2016-07-06 DIAGNOSIS — M9903 Segmental and somatic dysfunction of lumbar region: Secondary | ICD-10-CM

## 2016-07-06 DIAGNOSIS — M9904 Segmental and somatic dysfunction of sacral region: Secondary | ICD-10-CM | POA: Diagnosis not present

## 2016-07-06 DIAGNOSIS — M533 Sacrococcygeal disorders, not elsewhere classified: Secondary | ICD-10-CM

## 2016-07-06 NOTE — Assessment & Plan Note (Signed)
Patient has made significant improvement at this time. We discussed icing regimen and home exercises. We discussed which activities doing which ones to potentially avoid. Patient is going to start increasing her activity as tolerated. Patient will be sent to formal physical therapy for core strengthening. Patient will come back and see me again in 4-6 weeks for further manipulation and evaluation.

## 2016-07-06 NOTE — Assessment & Plan Note (Signed)
Decision today to treat with OMT was based on Physical Exam  After verbal consent patient was treated with HVLA, ME, FPR techniques in thoracic areas  Patient tolerated the procedure well with improvement in symptoms  Patient given exercises, stretches and lifestyle modifications  See medications in patient instructions if given  Patient will follow up in 4 weeks

## 2016-07-06 NOTE — Patient Instructions (Addendum)
Hi stranger! Light manipulation done today  Ice is your friend and ibuprofen today  Start increasing your activity if you can find time.  2 tennis ball in a tube sock and lay on them where head meets neck when head feels heavy Have a great weekend See me again in 4 weeks. I am proud of you

## 2016-07-20 ENCOUNTER — Encounter: Payer: Self-pay | Admitting: Family Medicine

## 2016-07-20 MED ORDER — VENLAFAXINE HCL ER 37.5 MG PO CP24: 37.5000 mg | ORAL_CAPSULE | Freq: Every day | ORAL | 1 refills | Status: DC

## 2016-07-20 MED FILL — VENLAFAXINE HCL ER 37.5 MG: 37.5 | 90 days supply | Qty: 90 | Fill #0

## 2016-08-02 NOTE — Progress Notes (Signed)
[name] [place], [place] [address] Phone: [phone] Subjective:    CC: back pain follow-up.   [id number]  Shelby [name] is a 40 y.o. female coming in with complaint of back pain.patient did have an acute lumbar radiculopathy multiple months ago.   Patient has sacroiliac dysfunction. Patient is having worsening symptoms. Patient was having radicular symptoms as well. Seem to be intermittent. Pain is worsening nonpitting controlled with prednisone. Patient did have an MRI. MRI showed mild lumbar disc degeneration at L4-S1 as well as a small right paracentral disc herniation causing compression of the right L5 nerve root. Patient then on 05/23/2016 had a epidural fat L4-L5.  08/04/15 updatePatient still states that her right side is feeling significant a better. No significant pain. Continues to work out fairly regularly. Some mild increasing tightness recently. She takes a muscle relaxer at night she seems to do very well.  Patient's previous imaging showed some mild facet hypertrophy as well as mild degenerative disc disease at L5-S1 these were taken 03/28/2015 and were independently visualized by me.  Past Medical History:  Diagnosis Date  . Anxiety   . GERD (gastroesophageal reflux disease)       Past Medical History:  Diagnosis Date  . Anxiety   . GERD (gastroesophageal reflux disease)    Past Surgical History:  Procedure Laterality Date  . Cyst remove  12/2009  . INTRAUTERINE DEVICE INSERTION  12/2009   Social History  Substance Use Topics  . Smoking status: Never Smoker  . 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 Alcohol use Yes   No Known Allergies Family History  Problem Relation Age of Onset  . Hyperlipidemia Mother   . Hypertension Mother   . Hypertension Father   . Bipolar disorder Sister     x 2  . Depression  Mother    '  Past medical history, social, surgical and family history all reviewed in electronic medical record.   Review of Systems: No headache, visual changes, nausea, vomiting, diarrhea, constipation, dizziness, abdominal pain, skin rash, fevers, chills, night sweats, weight loss, swollen lymph nodes, body aches, joint swelling, muscle aches, chest pain, shortness of breath, mood changes.   Objective  Blood pressure 110/82, pulse 77, SpO2 99 %.  General: patient is very uncomfortable HEENT: Pupils equal, extraocular movements intact patient's tender to palpation over the maxillary sinus on the right side. Respiratory: Patient's speak in full sentences and does not appear short of breath  Cardiovascular: No lower extremity edema, non tender, no erythema  Skin: Warm dry intact with no signs of infection or rash on extremities or on axial skeleton.  Abdomen: Soft nontender  Neuro: Cranial nerves II through XII are intact, neurovascularly intact in all extremities with 2+ DTRs and 2+ pulses.  Lymph: No lymphadenopathy of posterior or anterior cervical chain or axillae bilaterally.  Gait normal with good balance and coordination.  MSK:  Non tender with full range of motion and good stability and symmetric strength and tone of shoulders, elbows, wrist, hip, knee and ankles bilaterally.  Back Exam:  Inspection: Unremarkable  Motion: Flexion 40 deg Extension 15 deg, Side Bending to 25 deg bilaterally,  Rotation to 35 deg bilaterally increasing range of motion SLR laying: Negative straight leg test  XSLR laying: Negative Palpable tenderness:Nontender in exam today.  FABER: Tightness  of the right side but no pain Sensory change: Gross sensation intact to all lumbar and sacral dermatomes.  Reflexes: 2+ at both patellar tendons, 2+ at achilles tendons, Babinski's downgoing.  Strength at foot  Plantar-flexion: 5/5 Dorsi-flexion: 5/5 Eversion: 5/5 Inversion: 5/5  Leg strength  Quad: 5/5  Hamstring: 5/5\symmetric . Mild tightness noted on the left side  Hip flexor: 5/5 Hip abductors: 4+ out of 5 but symmetric Gait unremarkable.  Osteopathic findings C2 flexed rotated and side bent right T5 extended rotated and side bent left with inhaled third rib T8 extended rotated and side bent right L2 flexed rotated and side bent right    Impression and Recommendations:     This case required medical decision making of moderate complexity.

## 2016-08-03 ENCOUNTER — Ambulatory Visit (INDEPENDENT_AMBULATORY_CARE_PROVIDER_SITE_OTHER): Payer: 59 | Admitting: Family Medicine

## 2016-08-03 ENCOUNTER — Encounter: Payer: Self-pay | Admitting: Family Medicine

## 2016-08-03 VITALS — BP 110/82 | HR 77

## 2016-08-03 DIAGNOSIS — M999 Biomechanical lesion, unspecified: Secondary | ICD-10-CM | POA: Diagnosis not present

## 2016-08-03 DIAGNOSIS — M533 Sacrococcygeal disorders, not elsewhere classified: Secondary | ICD-10-CM | POA: Diagnosis not present

## 2016-08-03 NOTE — Assessment & Plan Note (Signed)
Decision today to treat with OMT was based on Physical Exam  After verbal consent patient was treated with HVLA, ME, FPR techniques in thoracic areas  Patient tolerated the procedure well with improvement in symptoms  Patient given exercises, stretches and lifestyle modifications  See medications in patient instructions if given  Patient will follow up in 4 weeks

## 2016-08-03 NOTE — Patient Instructions (Signed)
happy haloween Have fun Stay active and continue the pills and exercises See if any specific activity causes it See me agai nin 4-6 weeks

## 2016-08-03 NOTE — Assessment & Plan Note (Signed)
Overall patient seems to be doing very well. No lumbar radiculopathy at this point. The right side seems to be doing significantly better. No change in current therapy. We will see her though every 4-6 weeks. No change in medication

## 2016-08-13 MED FILL — tiZANidine HCL 4 MG TABS: 4 | 30 days supply | Qty: 30 | Fill #1

## 2016-08-24 ENCOUNTER — Encounter: Payer: Self-pay | Admitting: Family Medicine

## 2016-09-04 ENCOUNTER — Ambulatory Visit: Payer: 59 | Admitting: Family Medicine

## 2016-09-06 MED FILL — OSELTAMIVIR PHOS 75 MG CAP: 75 | 10 days supply | Qty: 10 | Fill #0

## 2016-09-06 NOTE — Progress Notes (Signed)
[name] [address] [place], [place] [address] Phone: [phone] Subjective:    CC: back pain follow-up.   [id number]  Shelby [name] is a 40 y.o. female coming in with complaint of back pain.patient did have an acute lumbar radiculopathy multiple months ago.   Patient has sacroiliac dysfunction. Patient is having worsening symptoms. Patient was having radicular symptoms as well. Seem to be intermittent. Pain is worsening nonpitting controlled with prednisone. Patient did have an MRI. MRI showed mild lumbar disc degeneration at L4-S1 as well as a small right paracentral disc herniation causing compression of the right L5 nerve root. Patient then on 05/23/2016 had a epidural L4-L5.  08/04/15 updatePatient still states that her right side is feeling significant a better. No significant pain. Continues to work out fairly regularly. Some mild increasing tightness recently. She takes a muscle relaxer at night she seems to do very well.  09/07/2016 update- Overall patient seems to be doing relatively well but is having increasing frequency of every day radiation down the leg somewhat. Not as severe as what it was but significantly worsened when she has been recently. Patient is having more tightness of the middle back as well as. Patient is wanting to go to the [place] for a yoga trip and wants to make sure that she is feeling good. Patient is wondering if she should have a repeat injection because it did seem to help out significantly.    Past Medical History:  Diagnosis Date  . Anxiety   . GERD (gastroesophageal reflux disease)       Past Medical History:  Diagnosis Date  . Anxiety   . GERD (gastroesophageal reflux disease)    Past Surgical History:  Procedure Laterality Date  . Cyst remove  12/2009  . INTRAUTERINE DEVICE INSERTION  12/2009   Social History  Substance Use Topics  . Smoking status: Never Smoker  . Smokeless tobacco: Not on file       Comment: married, lives with spouse (GI doc at [facility]) and 2 kids - previous HS history teacher x 15yr  . Alcohol use Yes   No Known Allergies Family History  Problem Relation Age of Onset  . Hyperlipidemia Mother   . Hypertension Mother   . Hypertension Father   . Bipolar disorder Sister     x 2  . Depression Mother    '  Past medical history, social, surgical and family history all reviewed in electronic medical record.   Review of Systems: No headache, visual changes, nausea, vomiting, diarrhea, constipation, dizziness, abdominal pain, skin rash, fevers, chills, night sweats, weight loss, swollen lymph nodes, body aches, joint swelling, muscle aches, chest pain, shortness of breath, mood changes.   Objective  Blood pressure 128/80, pulse 60, height 5' 9.5" (1.765 m), weight 178 lb (80.7 kg), SpO2 95 %.  Systems examined below as of 09/07/16 General: NAD A&O x3 mood, affect normal  HEENT: Pupils equal, extraocular movements intact no nystagmus Respiratory: not short of breath at rest or with speaking Cardiovascular: No lower extremity edema, non tender Skin: Warm dry intact with no signs of infection or rash on extremities or on axial skeleton. Abdomen: Soft nontender, no masses Neuro: Cranial nerves  intact, neurovascularly intact in all extremities with 2+ DTRs and 2+ pulses. Lymph: No lymphadenopathy appreciated today  Gait normal with good balance and coordination.  MSK: Non tender with full range of motion and good stability and symmetric strength and tone of shoulders,  elbows, wrist,  knee hips and ankles bilaterally.    Back Exam:  Inspection: Unremarkable  Motion: Flexion 40 deg Extension 15 deg, Side Bending to 25 deg bilaterally,  Rotation to 35 deg bilaterally increasing range of motion SLR laying: Mild positive straight leg test on the right XSLR laying: Negative Palpable increasing tightness and pain in the piriformis muscle and the lower back. Sensory change:  Gross sensation intact to all lumbar and sacral dermatomes.  Reflexes: 2+ at both patellar tendons, 2+ at achilles tendons, Babinski's downgoing.  Strength at foot  Plantar-flexion: 5/5 Dorsi-flexion: 5/5 Eversion: 5/5 Inversion: 5/5  Leg strength  Quad: 5/5 Hamstring: 5/5\symmetric . Mild tightness noted on the left side  Hip flexor: 5/5 Hip abductors: 4+ out of 5 but symmetric Gait unremarkable.  Osteopathic findings C6 flexed rotated and side bent right T3 extended rotated and side bent left with inhaled third rib T9 extended rotated and side bent right L4 flexed rotated and side bent right    Impression and Recommendations:     This case required medical decision making of moderate complexity.

## 2016-09-07 ENCOUNTER — Ambulatory Visit: Payer: Self-pay

## 2016-09-07 ENCOUNTER — Encounter: Payer: Self-pay | Admitting: Family Medicine

## 2016-09-07 ENCOUNTER — Ambulatory Visit (INDEPENDENT_AMBULATORY_CARE_PROVIDER_SITE_OTHER): Payer: 59 | Admitting: Family Medicine

## 2016-09-07 VITALS — BP 128/80 | HR 60 | Ht 69.5 in | Wt 178.0 lb

## 2016-09-07 DIAGNOSIS — M5416 Radiculopathy, lumbar region: Secondary | ICD-10-CM

## 2016-09-07 DIAGNOSIS — M999 Biomechanical lesion, unspecified: Secondary | ICD-10-CM | POA: Diagnosis not present

## 2016-09-07 NOTE — Patient Instructions (Addendum)
Happy holidays!  So good to see you! We will repeat injection  Keep up everything else.  See me again before you go.

## 2016-09-07 NOTE — Assessment & Plan Note (Signed)
Decision today to treat with OMT was based on Physical Exam  After verbal consent patient was treated with HVLA, ME, FPR techniques in thoracic areas  Patient tolerated the procedure well with improvement in symptoms  Patient given exercises, stretches and lifestyle modifications  See medications in patient instructions if given  Patient will follow up in 4-6 weeks depending on the epidural

## 2016-09-07 NOTE — Assessment & Plan Note (Signed)
Worsening symptoms. Patient is now having another positive straight leg test. Patient was in significant pain previously and I do feel at this point patient couldn't respond well to another epidural. This will be ordered today. We did attempt osteopathic manipulation. Encourage patient to do more the core exercises. Has a muscle relaxer for breakthrough pain. She has been unfortunate using them more frequently as well. Continue all other medications. I will like to see her again in 2 weeks after the epidural to see how patient does.

## 2016-09-17 DIAGNOSIS — Z1231 Encounter for screening mammogram for malignant neoplasm of breast: Secondary | ICD-10-CM | POA: Diagnosis not present

## 2016-09-19 ENCOUNTER — Ambulatory Visit
Admission: RE | Admit: 2016-09-19 | Discharge: 2016-09-19 | Disposition: A | Discharge status: Home / Self Care | Payer: 59 | Source: Ambulatory Visit | Attending: Obstetrics and Gynecology | Admitting: Obstetrics and Gynecology

## 2016-09-19 ENCOUNTER — Other Ambulatory Visit: Payer: Self-pay | Admitting: Obstetrics and Gynecology

## 2016-09-19 DIAGNOSIS — N6489 Other specified disorders of breast: Secondary | ICD-10-CM | POA: Diagnosis not present

## 2016-09-19 DIAGNOSIS — R922 Inconclusive mammogram: Secondary | ICD-10-CM | POA: Diagnosis not present

## 2016-09-19 DIAGNOSIS — R928 Other abnormal and inconclusive findings on diagnostic imaging of breast: Secondary | ICD-10-CM

## 2016-09-20 ENCOUNTER — Ambulatory Visit
Admission: RE | Admit: 2016-09-20 | Discharge: 2016-09-20 | Disposition: A | Discharge status: Home / Self Care | Payer: 59 | Source: Ambulatory Visit | Attending: Family Medicine | Admitting: Family Medicine

## 2016-09-20 DIAGNOSIS — M5416 Radiculopathy, lumbar region: Secondary | ICD-10-CM

## 2016-09-20 DIAGNOSIS — M5126 Other intervertebral disc displacement, lumbar region: Secondary | ICD-10-CM | POA: Diagnosis not present

## 2016-09-20 MED ORDER — METHYLPREDNISOLONE ACETATE 40 MG/ML INJ SUSP (RADIOLOG
120.0000 mg | Freq: Once | INTRAMUSCULAR | Status: AC
  Administered 2016-09-20: 120 mg via EPIDURAL

## 2016-09-20 MED ORDER — IOPAMIDOL (ISOVUE-M 200) INJECTION 41%
1.0000 mL | Freq: Once | INTRAMUSCULAR | Status: AC
  Administered 2016-09-20: 1 mL via EPIDURAL

## 2016-09-26 MED FILL — tiZANidine HCL 4 MG TABS: 4 | 30 days supply | Qty: 30 | Fill #2

## 2016-09-28 ENCOUNTER — Other Ambulatory Visit: Payer: 59

## 2016-10-15 ENCOUNTER — Encounter: Payer: Self-pay | Admitting: Family Medicine

## 2016-10-17 ENCOUNTER — Encounter: Payer: Self-pay | Admitting: Family Medicine

## 2016-10-17 ENCOUNTER — Ambulatory Visit (INDEPENDENT_AMBULATORY_CARE_PROVIDER_SITE_OTHER): Payer: 59 | Admitting: Family Medicine

## 2016-10-17 VITALS — BP 122/72 | HR 103 | Ht 69.5 in

## 2016-10-17 DIAGNOSIS — M999 Biomechanical lesion, unspecified: Secondary | ICD-10-CM

## 2016-10-17 DIAGNOSIS — M533 Sacrococcygeal disorders, not elsewhere classified: Secondary | ICD-10-CM | POA: Diagnosis not present

## 2016-10-17 MED ORDER — TIZANIDINE HCL 4 MG PO TABS: 4.0000 mg | ORAL_TABLET | Freq: Every evening | ORAL | 2 refills | Status: DC

## 2016-10-17 NOTE — Patient Instructions (Signed)
Good to see you  Shelby [name] is your friend  Keep being active.  Maybe wed.

## 2016-10-17 NOTE — Assessment & Plan Note (Signed)
Seems to be doing relatively well at this time. Did not want to change in medications. Refill muscle relaxer. Seems to be stable. Continues respond well to a supine manipulation. No significant large changes at this time. Follow-up again in 3 weeks. Patient will be leaving the country for multiple days and would like another visit before this.

## 2016-10-17 NOTE — Progress Notes (Signed)
[name] [address] [place], [place] [address] Phone: [phone] Subjective:    CC: back pain follow-up.   [id number]  Shelby [name] is a 41 y.o. female coming in with complaint of back pain.patient did have an acute lumbar radiculopathy multiple months ago.   Patient has sacroiliac dysfunction. Patient is having worsening symptoms. Patient was having radicular symptoms as well. Seem to be intermittent. Pain is worsening nonpitting controlled with prednisone. Patient did have an MRI. MRI showed mild lumbar disc degeneration at L4-S1 as well as a small right paracentral disc herniation causing compression of the right L5 nerve root. Patient then on 05/23/2016 had a epidural L4-L5.  08/04/15 updatePatient still states that her right side is feeling significant a better. No significant pain. Continues to work out fairly regularly. Some mild increasing tightness recently. She takes a muscle relaxer at night she seems to do very well.  09/07/2016 update- Overall patient seems to be doing relatively well but is having increasing frequency of every day radiation down the leg somewhat. Not as severe as what it was but significantly worsened when she has been recently. Patient is having more tightness of the middle back as well as. Patient is wanting to go to the [place] for a yoga trip and wants to make sure that she is feeling good. Patient is wondering if she should have a repeat injection because it did seem to help out significantly  10/17/2016. Patient did have another epidural. Did not have as much improvement. Didn't have improvement overall. Patient has increased her activity doing more yoga and tennis and has noticed some mild increase in tightness but not having any true radicular symptoms at this time. Shelby [name] daily activities just fine. No severe pain when she's had previously. Seems buckling 24 hours after exercises she starts having more discomfort and  pain..    Past Medical History:  Diagnosis Date  . Anxiety   . GERD (gastroesophageal reflux disease)       Past Medical History:  Diagnosis Date  . Anxiety   . GERD (gastroesophageal reflux disease)    Past Surgical History:  Procedure Laterality Date  . Cyst remove  12/2009  . INTRAUTERINE DEVICE INSERTION  12/2009   Social History  Substance Use Topics  . Smoking status: Never Smoker  . Smokeless tobacco: Not on file     Comment: married, lives with spouse (GI doc at [place]) and 2 kids - previous HS history teacher x 60yr  . Alcohol use Yes   No Known Allergies Family History  Problem Relation Age of Onset  . Hyperlipidemia Mother   . Hypertension Mother   . Hypertension Father   . Bipolar disorder Sister     x 2  . Depression Mother    '  Past medical history, social, surgical and family history all reviewed in electronic medical record.   Review of Systems: No headache, visual changes, nausea, vomiting, diarrhea, constipation, dizziness, abdominal pain, skin rash, fevers, chills, night sweats, weight loss, swollen lymph nodes, body aches, joint swelling, muscle aches, chest pain, shortness of breath, mood changes.     Objective  Blood pressure 122/72, pulse (!) 103, height 5' 9.5" (1.765 m), SpO2 96 %.  Systems examined below as of 10/17/16 General: NAD A&O x3 mood, affect normal  HEENT: Pupils equal, extraocular movements intact no nystagmus Respiratory: not short of breath at rest or with speaking Cardiovascular: No lower extremity edema, non tender Skin: Warm dry  intact with no signs of infection or rash on extremities or on axial skeleton. Abdomen: Soft nontender, no masses Neuro: Cranial nerves  intact, neurovascularly intact in all extremities with 2+ DTRs and 2+ pulses. Lymph: No lymphadenopathy appreciated today  Gait normal with good balance and coordination.  MSK: Non tender with full range of motion and good stability and symmetric strength and tone  of shoulders, elbows, wrist,  knee hips and ankles bilaterally.   Back Exam:  Inspection: Unremarkable  Motion: Flexion 40 deg Extension 20 deg, Side Bending to 25 deg bilaterally,  Rotation to 35 deg bilaterally SLR laying: Very mild symptoms on the right side XSLR laying: Negative Increased tenderness to palpation over the paraspinal musculature of the lumbar spine mostly on the right side Sensory change: Gross sensation intact to all lumbar and sacral dermatomes.  Reflexes: 2+ at both patellar tendons, 2+ at achilles tendons, Babinski's downgoing.  Strength at foot  Plantar-flexion: 5/5 Dorsi-flexion: 5/5 Eversion: 5/5 Inversion: 5/5  Leg strength  Quad: 5/5 Hamstring: 5/5\symmetric   Hip flexor: 5/5 Hip abductors: 4+ out of 5 but symmetric Gait unremarkable.  Osteopathic findings Cervical C2 flexed rotated and side bent right C4 flexed rotated and side bent left C6 flexed rotated and side bent left T3 extended rotated and side bent right inhaled third rib T9 extended rotated and side bent left L2 flexed rotated and side bent right Sacrum right on right     Impression and Recommendations:     This case required medical decision making of moderate complexity.

## 2016-10-17 NOTE — Assessment & Plan Note (Signed)
Decision today to treat with OMT was based on Physical Exam  After verbal consent patient was treated with HVLA, ME, FPR techniques in thoracic areas  Patient tolerated the procedure well with improvement in symptoms  Patient given exercises, stretches and lifestyle modifications  See medications in patient instructions if given  Patient will follow up in 3-6 weeks

## 2016-10-18 ENCOUNTER — Ambulatory Visit: Payer: 59 | Admitting: Family Medicine

## 2016-10-23 MED FILL — VENLAFAXINE HCL ER 37.5 MG: 37.5 | 90 days supply | Qty: 90 | Fill #1

## 2016-10-24 ENCOUNTER — Ambulatory Visit: Payer: 59 | Admitting: Family Medicine

## 2016-10-31 MED FILL — tiZANidine HCL 4 MG TABS: 4 | 90 days supply | Qty: 90 | Fill #0

## 2016-12-09 ENCOUNTER — Encounter: Payer: Self-pay | Admitting: Family Medicine

## 2016-12-11 NOTE — Progress Notes (Signed)
[name] [address] [place], [place] [address] Phone: [phone] Subjective:    CC: back pain follow-up.   [id number]  Shelby [name] is a 41 y.o. female coming in with complaint of back pain.patient did have an acute lumbar radiculopathy multiple months ago.    Patient has sacroiliac dysfunction. Patient is having worsening symptoms. Patient was having radicular symptoms as well. Seem to be intermittent. Pain is worsening nonpitting controlled with prednisone. Patient did have an MRI. MRI showed mild lumbar disc degeneration at L4-S1 as well as a small right paracentral disc herniation causing compression of the right L5 nerve root. Patient then on 05/23/2016 had a epidural L4-L5.  08/04/15 updatePatient still states that her right side is feeling significant a better. No significant pain. Continues to work out fairly regularly. Some mild increasing tightness recently. She takes a muscle relaxer at night she seems to do very well.  09/07/2016 update- Overall patient seems to be doing relatively well but is having increasing frequency of every day radiation down the leg somewhat. Not as severe as what it was but significantly worsened when she has been recently. Patient is having more tightness of the middle back as well as. Patient is wanting to go to the [place] for a yoga trip and wants to make sure that she is feeling good. Patient is wondering if she should have a repeat injection because it did seem to help out significantly  10/17/2016. Patient did have another epidural. Did not have as much improvement. Didn't have improvement overall. Patient has increased her activity doing more yoga and tennis and has noticed some mild increase in tightness but not having any true radicular symptoms at this time. [name] daily activities just fine. No severe pain when she's had previously. Seems buckling 24 hours after exercises she starts having more discomfort and  pain..  12/12/16 updated. Had been doing very good. Has been 2 months since we seen patient. Patient states over the course last week started having increasing tightness again. Patient states that she was doing a stress and felt pain popping sensation. States that she had another audible pop occurred when she was at church. States that this is very painful overall. Describes pain as a dull, throbbing aching sensation. Seems to be on the right side. Not like her back. Seems to be lower.  Past Medical History:  Diagnosis Date  . Anxiety   . GERD (gastroesophageal reflux disease)       Past Medical History:  Diagnosis Date  . Anxiety   . GERD (gastroesophageal reflux disease)    Past Surgical History:  Procedure Laterality Date  . Cyst remove  12/2009  . INTRAUTERINE DEVICE INSERTION  12/2009   Social History  Substance Use Topics  . Smoking status: Never Smoker  . 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 Alcohol use Yes   No Known Allergies Family History  Problem Relation Age of Onset  . Hyperlipidemia Mother   . Hypertension Mother   . Hypertension Father   . Bipolar disorder Sister     x 2  . Depression Mother    '  Past medical history, social, surgical and family history all reviewed in electronic medical record.   Review of Systems: No headache, visual changes, nausea, vomiting, diarrhea, constipation, dizziness, abdominal pain, skin rash, fevers, chills, night  sweats, weight loss, swollen lymph nodes, body aches, joint swelling, muscle aches, chest pain, shortness of breath, mood changes.     Objective  Blood pressure 112/82, pulse 74, height 5' 9.5" (1.765 m), SpO2 99 %.  Systems examined below as of 12/12/16 General: NAD A&O x3 mood, affect normal  HEENT: Pupils equal, extraocular movements intact no nystagmus Respiratory: not short of breath at rest or with  speaking Cardiovascular: No lower extremity edema, non tender Skin: Warm dry intact with no signs of infection or rash on extremities or on axial skeleton. Abdomen: Soft nontender, no masses Neuro: Cranial nerves  intact, neurovascularly intact in all extremities with 2+ DTRs and 2+ pulses. Lymph: No lymphadenopathy appreciated today  Gait normal with good balance and coordination.  MSK: Non tender with full range of motion and good stability and symmetric strength and tone of shoulders, elbows, wrist,  knee hips and ankles bilaterally.   Back Exam:  Inspection: Unremarkable  Motion: Flexion 40 deg Extension 20 deg, Side Bending to 25 deg bilaterally,  Rotation to 35 deg bilaterally SLR laying: Very mild symptoms on the right side XSLR laying: Negative More tenderness actually over the right sacroiliac joint than usual. Positive Faber test  Sensory change: Gross sensation intact to all lumbar and sacral dermatomes.  Reflexes: 2+ at both patellar tendons, 2+ at achilles tendons, Babinski's downgoing.  Strength at foot  Plantar-flexion: 5/5 Dorsi-flexion: 5/5 Eversion: 5/5 Inversion: 5/5  Leg strength  Quad: 5/5 Hamstring: 5/5\symmetric   Hip flexor: 5/5 Hip abductors: 4+ out of 5 but symmetric Gait unremarkable.  Osteopathic findings Cervical C3 flexed rotated and side bent right T3 extended rotated and side bent right inhaled third rib T8 extended rotated and side bent left L2 flexed rotated and side bent right Sacrum left on left'     Impression and Recommendations:     This case required medical decision making of moderate complexity.

## 2016-12-12 ENCOUNTER — Ambulatory Visit (INDEPENDENT_AMBULATORY_CARE_PROVIDER_SITE_OTHER): Payer: 59 | Admitting: Family Medicine

## 2016-12-12 ENCOUNTER — Encounter: Payer: Self-pay | Admitting: Family Medicine

## 2016-12-12 VITALS — BP 112/82 | HR 74 | Ht 69.5 in

## 2016-12-12 DIAGNOSIS — M533 Sacrococcygeal disorders, not elsewhere classified: Secondary | ICD-10-CM

## 2016-12-12 DIAGNOSIS — M999 Biomechanical lesion, unspecified: Secondary | ICD-10-CM | POA: Diagnosis not present

## 2016-12-12 NOTE — Assessment & Plan Note (Signed)
More of a sacroiliac dysfunction. No significant radicular symptoms. Negative straight leg test today. I do believe the patient will do well with conservative therapy. We discussed icing regimen and home exercises. We discussed which activities to do in which ones to avoid. Patient will increase activity as tolerated. Follow-up again in 4 weeks

## 2016-12-12 NOTE — Assessment & Plan Note (Signed)
Decision today to treat with OMT was based on Physical Exam  After verbal consent patient was treated with HVLA, ME, FPR techniques in cervical, thoracic, lumbar and sacral areas  Patient tolerated the procedure well with improvement in symptoms  Patient given exercises, stretches and lifestyle modifications  See medications in patient instructions if given  Patient will follow up in 4 weeks 

## 2016-12-12 NOTE — Patient Instructions (Signed)
3 Weeks.

## 2016-12-21 ENCOUNTER — Encounter: Payer: Self-pay | Admitting: Family Medicine

## 2016-12-24 NOTE — Progress Notes (Signed)
[name] [place] [place], [place] [address] Phone: [phone] Subjective:    CC: back pain follow-up.   [id number]  Shelby [name] is a 41 y.o. female coming in with complaint of back pain.patient did have an acute lumbar radiculopathy multiple months ago.    Patient has sacroiliac dysfunction. Patient is having worsening symptoms. Patient was having radicular symptoms as well. Seem to be intermittent. Pain is worsening nonpitting controlled with prednisone. Patient did have an MRI. MRI showed mild lumbar disc degeneration at L4-S1 as well as a small right paracentral disc herniation causing compression of the right L5 nerve root. Patient then on 05/23/2016 had a epidural L4-L5.  08/04/15 updatePatient still states that her right side is feeling significant a better. No significant pain. Continues to work out fairly regularly. Some mild increasing tightness recently. She takes a muscle relaxer at night she seems to do very well.  09/07/2016 update- Overall patient seems to be doing relatively well but is having increasing frequency of every day radiation down the leg somewhat. Not as severe as what it was but significantly worsened when she has been recently. Patient is having more tightness of the middle back as well as. Patient is wanting to go to the [place] for a yoga trip and wants to make sure that she is feeling good. Patient is wondering if she should have a repeat injection because it did seem to help out significantly  10/17/2016. Patient did have another epidural. Did not have as much improvement. Didn't have improvement overall. Patient has increased her activity doing more yoga and tennis and has noticed some mild increase in tightness but not having any true radicular symptoms at this time. [name] daily activities just fine. No severe pain when she's had previously. Seems buckling 24 hours after exercises she starts having more discomfort and  pain..  12/12/16 updated. Had been doing very good. Has been 2 months since we seen patient. Patient states over the course last week started having increasing tightness again. Patient states that she was doing a stress and felt pain popping sensation. States that she had another audible pop occurred when she was at church. States that this is very painful overall. Describes pain as a dull, throbbing aching sensation. Seems to be on the right side. Not like her back. Seems to be lower.   Past Medical History:  Diagnosis Date  . Anxiety   . GERD (gastroesophageal reflux disease)       Past Medical History:  Diagnosis Date  . Anxiety   . GERD (gastroesophageal reflux disease)    Past Surgical History:  Procedure Laterality Date  . Cyst remove  12/2009  . INTRAUTERINE DEVICE INSERTION  12/2009   Social History  Substance Use Topics  . Smoking status: Never Smoker  . 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 Alcohol use Yes   No Known Allergies Family History  Problem Relation Age of Onset  . Hyperlipidemia Mother   . Hypertension Mother   . Depression Mother   . Hypertension Father   . Bipolar disorder Sister     x 2   '  Past medical history, social, surgical and family history all reviewed in electronic medical record.   Review of Systems: No headache, visual changes, nausea, vomiting, diarrhea, constipation, dizziness, abdominal pain, skin rash, fevers, chills,  night sweats, weight loss, swollen lymph nodes, body aches, joint swelling, chest pain, shortness of breath, mood changes.  Positive muscle aches but went to yoga recently.  Objective  Blood pressure 102/72, pulse 83, [name] 5' 9.5" (1.765 m), weight 178 lb 6.4 oz (80.9 kg), SpO2 98 %.  Systems examined below as of 12/25/16 General: NAD A&O x3 mood, affect normal  HEENT: Pupils equal, extraocular movements intact no  nystagmus Respiratory: not short of breath at rest or with speaking Cardiovascular: No lower extremity edema, non tender Skin: Warm dry intact with no signs of infection or rash on extremities or on axial skeleton. Abdomen: Soft nontender, no masses Neuro: Cranial nerves  intact, neurovascularly intact in all extremities with 2+ DTRs and 2+ pulses. Lymph: No lymphadenopathy appreciated today  Gait normal with good balance and coordination.  MSK: Non tender with full range of motion and good stability and symmetric strength and tone of shoulders, elbows, wrist,  knee hips and ankles bilaterally.   Back Exam:  Inspection: Unremarkable  Motion: Flexion 40 deg Extension 20 deg continued tightness, Side Bending to 25 deg bilaterally,  Rotation to 35 deg bilaterally SLR laying: Still has tightness on the right side but no radicular symptoms XSLR laying: Negative More tenderness actually over the right sacroiliac joint than usual. Positive Faber test still remaining Sensory change: Gross sensation intact to all lumbar and sacral dermatomes.  Reflexes: 2+ at both patellar tendons, 2+ at achilles tendons, Babinski's downgoing.  Strength at foot  Plantar-flexion: 5/5 Dorsi-flexion: 5/5 Eversion: 5/5 Inversion: 5/5  Leg strength  Quad: 5/5 Hamstring: 5/5\symmetric   Hip flexor: 5/5 Hip abductors: 4+ out of 5 but symmetric Gait unremarkable.  Osteopathic findings Cervical C2 flexed rotated and side bent right C4 flexed rotated and side bent left C5 flexed rotated and side bent left T3 extended rotated and side bent right inhaled third rib T9 extended rotated and side bent left L2 flexed rotated and side bent right Sacrum right on right Pelvic shear noted     Impression and Recommendations:     This case required medical decision making of moderate complexity.

## 2016-12-25 ENCOUNTER — Encounter: Payer: Self-pay | Admitting: Family Medicine

## 2016-12-25 ENCOUNTER — Ambulatory Visit (INDEPENDENT_AMBULATORY_CARE_PROVIDER_SITE_OTHER): Payer: 59 | Admitting: Family Medicine

## 2016-12-25 VITALS — BP 102/72 | HR 83 | Ht 69.5 in | Wt 178.4 lb

## 2016-12-25 DIAGNOSIS — M5416 Radiculopathy, lumbar region: Secondary | ICD-10-CM | POA: Diagnosis not present

## 2016-12-25 DIAGNOSIS — M999 Biomechanical lesion, unspecified: Secondary | ICD-10-CM

## 2016-12-25 DIAGNOSIS — M533 Sacrococcygeal disorders, not elsewhere classified: Secondary | ICD-10-CM | POA: Diagnosis not present

## 2016-12-25 MED ORDER — MONTELUKAST SODIUM 10 MG PO TABS: 10.0000 mg | ORAL_TABLET | Freq: Every day | ORAL | 3 refills | Status: DC

## 2016-12-25 NOTE — Assessment & Plan Note (Signed)
Stable at the moment. Worsening symptoms we'll consider another epidural. Patient encouraged to continue with the same medications including the Effexor. Patient will be started into formal physical therapy for this as well as sacroiliac joint. Patient has had significant exacerbations more seasonal swing did start on a seasonal medication to see if they'll be beneficial. Icing regimen. Home exercises. Follow-up again in 3-4 weeks.

## 2016-12-25 NOTE — Assessment & Plan Note (Signed)
Decision today to treat with OMT was based on Physical Exam  After verbal consent patient was treated with HVLA, ME, FPR techniques in cervical, thoracic, lumbar and sacral areas  Patient tolerated the procedure well with improvement in symptoms  Patient given exercises, stretches and lifestyle modifications  See medications in patient instructions if given  Patient will follow up in 3-4 weeks  

## 2016-12-25 NOTE — Assessment & Plan Note (Signed)
Worsening discomfort over the right sacroiliac joint today. Patient did respond very well to manipulation. Core stability discussed. We discussed the possibility of needing pelvic floor exercises but we'll continue to monitor. Following up in 3-4 weeks.

## 2016-12-31 DIAGNOSIS — M533 Sacrococcygeal disorders, not elsewhere classified: Secondary | ICD-10-CM | POA: Diagnosis not present

## 2017-01-02 ENCOUNTER — Ambulatory Visit (INDEPENDENT_AMBULATORY_CARE_PROVIDER_SITE_OTHER): Payer: 59 | Admitting: Family Medicine

## 2017-01-02 ENCOUNTER — Encounter: Payer: Self-pay | Admitting: Family Medicine

## 2017-01-02 VITALS — BP 102/72 | HR 68 | Ht 69.5 in | Wt 173.0 lb

## 2017-01-02 DIAGNOSIS — M533 Sacrococcygeal disorders, not elsewhere classified: Secondary | ICD-10-CM

## 2017-01-02 DIAGNOSIS — M999 Biomechanical lesion, unspecified: Secondary | ICD-10-CM | POA: Diagnosis not present

## 2017-01-02 NOTE — [facility] Notes (Signed)
[name] [address], [place] [address] Phone: [phone] Subjective:    CC: back pain follow-up.   [id number]  Shelby [name] is a 41 y.o. female coming in with complaint of back pain.patient did have an acute lumbar radiculopathy multiple months ago.    Patient has sacroiliac dysfunction. Patient is having worsening symptoms. Patient was having radicular symptoms as well. Seem to be intermittent. Pain is worsening nonpitting controlled with prednisone. Patient did have an MRI. MRI showed mild lumbar disc degeneration at L4-S1 as well as a small right paracentral disc herniation causing compression of the right L5 nerve root. Patient then on 05/23/2016 had a epidural L4-L5.  08/04/15 updatePatient still states that her right side is feeling significant a better. No significant pain. Continues to work out fairly regularly. Some mild increasing tightness recently. She takes a muscle relaxer at night she seems to do very well.  09/07/2016 update- Overall patient seems to be doing relatively well but is having increasing frequency of every day radiation down the leg somewhat. Not as severe as what it was but significantly worsened when she has been recently. Patient is having more tightness of the middle back as well as. Patient is wanting to go to the [place] for a yoga trip and wants to make sure that she is feeling good. Patient is wondering if she should have a repeat injection because it did seem to help out significantly  10/17/2016. Patient did have another epidural. Did not have as much improvement. Didn't have improvement overall. Patient has increased her activity doing more yoga and tennis and has noticed some mild increase in tightness but not having any true radicular symptoms at this time. [name] daily activities just fine. No severe pain when she's had previously. Seems buckling 24 hours after exercises she starts having more discomfort and  pain..  12/12/16 updated. Had been doing very good. Has been 2 months since we seen patient. Patient states over the course last week started having increasing tightness again. Patient states that she was doing a stress and felt pain popping sensation. States that she had another audible pop occurred when she was at church. States that this is very painful overall. Describes pain as a dull, throbbing aching sensation. Seems to be on the right side. Not like her back. Seems to be lower.   01/02/2017 update-patient has start with physical therapy. Is doing dry needling which has found to be helpful even though it is painful. States that it seems to be less going down the leg at this time. Less tightness as well.  Past Medical History:  Diagnosis Date  . Anxiety   . GERD (gastroesophageal reflux disease)       Past Medical History:  Diagnosis Date  . Anxiety   . GERD (gastroesophageal reflux disease)    Past Surgical History:  Procedure Laterality Date  . Cyst remove  12/2009  . INTRAUTERINE DEVICE INSERTION  12/2009   Social History  Substance Use Topics  . Smoking status: Never Smoker  . 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 Alcohol use Yes   No Known Allergies Family History  Problem Relation Age of Onset  . Hyperlipidemia Mother   . Hypertension Mother   . Depression Mother   . Hypertension Father   . Bipolar disorder Sister  x 2   '  Past medical history, social, surgical and family history all reviewed in electronic medical record.   Review of Systems: No headache, visual changes, nausea, vomiting, diarrhea, constipation, dizziness, abdominal pain, skin rash, fevers, chills, night sweats, weight loss, swollen lymph nodes, body aches, joint swelling, muscle aches, chest pain, shortness of breath, mood changes.    Objective  Blood pressure 102/72, pulse 68, height 5' 9.5"  (1.765 m), weight 173 lb (78.5 kg).  Systems examined below as of 01/02/17 General: NAD A&O x3 mood, affect normal  HEENT: Pupils equal, extraocular movements intact no nystagmus Respiratory: not short of breath at rest or with speaking Cardiovascular: No lower extremity edema, non tender Skin: Warm dry intact with no signs of infection or rash on extremities or on axial skeleton. Abdomen: Soft nontender, no masses Neuro: Cranial nerves  intact, neurovascularly intact in all extremities with 2+ DTRs and 2+ pulses. Lymph: No lymphadenopathy appreciated today  Gait normal with good balance and coordination.  MSK: Non tender with full range of motion and good stability and symmetric strength and tone of shoulders, elbows, wrist,  knee hips and ankles bilaterally.    Back Exam:  Inspection: Unremarkable  Motion: Flexion 40 deg Extension 25 deg continued tightness, Side Bending to 35 deg bilaterally,  Rotation to 35 deg bilaterally SLR laying: Tightness but no radicular symptoms XSLR laying: Negative Tenderness over the right sacroiliac joint and paraspinal musculature at L5-S1  Corky Sox test positive right Sensory change: Gross sensation intact to all lumbar and sacral dermatomes.  Reflexes: 2+ at both patellar tendons, 2+ at achilles tendons, Babinski's downgoing.  Strength at foot  Plantar-flexion: 5/5 Dorsi-flexion: 5/5 Eversion: 5/5 Inversion: 5/5  Leg strength  Quad: 5/5 Hamstring: 5/5\symmetric   Hip flexor: 5/5 Hip abductors: 4+ out of 5 but symmetric Gait unremarkable.  Osteopathic findings Cervical C2 flexed rotated and side bent right C4 flexed rotated and side bent left C6 flexed rotated and side bent left T3 extended rotated and side bent right inhaled third rib T9 extended rotated and side bent left L2 flexed rotated and side bent right Sacrum right on right Pelvic shear still noted.     Impression and Recommendations:     This case required medical decision making  of moderate complexity.

## 2017-01-02 NOTE — Assessment & Plan Note (Signed)
Continues to be difficult at this time. Patient's into the doing relatively well. We discussed ergonomics, hip abductor strengthening, we discussed continuing with the manipulation at this point. No significant changes otherwise. Follow-up again in 4-6 weeks.

## 2017-01-02 NOTE — Assessment & Plan Note (Signed)
Decision today to treat with OMT was based on Physical Exam  After verbal consent patient was treated with HVLA, ME, FPR techniques in cervical, thoracic, lumbar and sacral areas  Patient tolerated the procedure well with improvement in symptoms  Patient given exercises, stretches and lifestyle modifications  See medications in patient instructions if given  Patient will follow up in 4-6 weeks 

## 2017-01-02 NOTE — Patient Instructions (Addendum)
Good to see you  Shelby [name] is your friend.  Have a great trip  Make one in 3-4 weeks just in case.

## 2017-01-03 DIAGNOSIS — M533 Sacrococcygeal disorders, not elsewhere classified: Secondary | ICD-10-CM | POA: Diagnosis not present

## 2017-01-09 DIAGNOSIS — M533 Sacrococcygeal disorders, not elsewhere classified: Secondary | ICD-10-CM | POA: Diagnosis not present

## 2017-01-15 ENCOUNTER — Encounter: Payer: Self-pay | Admitting: Family Medicine

## 2017-01-15 ENCOUNTER — Ambulatory Visit (INDEPENDENT_AMBULATORY_CARE_PROVIDER_SITE_OTHER): Payer: 59 | Admitting: Family Medicine

## 2017-01-15 VITALS — BP 118/82 | HR 56 | Resp 16 | Wt 178.2 lb

## 2017-01-15 DIAGNOSIS — G5702 Lesion of sciatic nerve, left lower limb: Secondary | ICD-10-CM | POA: Diagnosis not present

## 2017-01-15 DIAGNOSIS — M999 Biomechanical lesion, unspecified: Secondary | ICD-10-CM

## 2017-01-15 DIAGNOSIS — M2140 Flat foot [pes planus] (acquired), unspecified foot: Secondary | ICD-10-CM

## 2017-01-15 MED ORDER — VENLAFAXINE HCL ER 75 MG PO CP24: 75.0000 mg | ORAL_CAPSULE | Freq: Every day | ORAL | 1 refills | Status: DC

## 2017-01-15 MED ORDER — GABAPENTIN 100 MG PO CAPS: 200.0000 mg | ORAL_CAPSULE | Freq: Every day | ORAL | 3 refills | Status: DC

## 2017-01-15 MED FILL — GABAPENTIN 100 MG CAPSULE: 100 | 30 days supply | Qty: 60 | Fill #0

## 2017-01-15 MED FILL — VENLAFAXINE HCL ER 75 MG CA: 75 | 30 days supply | Qty: 30 | Fill #0

## 2017-01-15 NOTE — Assessment & Plan Note (Signed)
Decision today to treat with OMT was based on Physical Exam  After verbal consent patient was treated with HVLA, ME, FPR techniques in cervical, thoracic, lumbar and sacral areas  Patient tolerated the procedure well with improvement in symptoms  Patient given exercises, stretches and lifestyle modifications  See medications in patient instructions if given  Patient will follow up in 1-3 weeks

## 2017-01-15 NOTE — [facility] Notes (Signed)
[name] [address], [place] [address] Phone: [phone] Subjective:    CC: back pain follow-up.   [id number]  Shelby [name] is a 41 y.o. female coming in with complaint of back pain.patient did have an acute lumbar radiculopathy multiple months ago.    Patient has sacroiliac dysfunction. Patient is having worsening symptoms. Patient was having radicular symptoms as well. Seem to be intermittent. Pain is worsening nonpitting controlled with prednisone. Patient did have an MRI. MRI showed mild lumbar disc degeneration at L4-S1 as well as a small right paracentral disc herniation causing compression of the right L5 nerve root. Patient then on 05/23/2016 had a epidural L4-L5.  08/04/15 updatePatient still states that her right side is feeling significant a better. No significant pain. Continues to work out fairly regularly. Some mild increasing tightness recently. She takes a muscle relaxer at night she seems to do very well.  09/07/2016 update- Overall patient seems to be doing relatively well but is having increasing frequency of every day radiation down the leg somewhat. Not as severe as what it was but significantly worsened when she has been recently. Patient is having more tightness of the middle back as well as. Patient is wanting to go to the [place] for a yoga trip and wants to make sure that she is feeling good. Patient is wondering if she should have a repeat injection because it did seem to help out significantly  10/17/2016. Patient did have another epidural. Did not have as much improvement. Didn't have improvement overall. Patient has increased her activity doing more yoga and tennis and has noticed some mild increase in tightness but not having any true radicular symptoms at this time. Shelby [name] daily activities just fine. No severe pain when she's had previously. Seems buckling 24 hours after exercises she starts having more discomfort and  pain..  12/12/16 updated. Had been doing very good. Has been 2 months since we seen patient. Patient states over the course last week started having increasing tightness again. Patient states that she was doing a stress and felt pain popping sensation. States that she had another audible pop occurred when she was at church. States that this is very painful overall. Describes pain as a dull, throbbing aching sensation. Seems to be on the right side. Not like her back. Seems to be lower.   01/02/2017 update-patient has start with physical therapy. Is doing dry needling which has found to be helpful even though it is painful. States that it seems to be less going down the leg at this time. Less tightness as well.  January 15 2017 update-patient started having more pain on the left side. [facility] Patient did do a significant amount walking well there. Patient has been having more of a spasm in the left buttocks. This is the opposite side from usual. Patient states that in the morning she feels like she has weakness in the leg. With increasing activity it seems to get better. Continues to have a dull throbbing aching sensation in the buttocks area. No fall or any trauma.  Past Medical History:  Diagnosis Date  . Anxiety   . GERD (gastroesophageal reflux disease)       Past Medical History:  Diagnosis Date  . Anxiety   . GERD (gastroesophageal reflux disease)    Past Surgical History:  Procedure Laterality Date  . Cyst remove  12/2009  . INTRAUTERINE DEVICE INSERTION  12/2009   Social  History  Substance Use Topics  . Smoking status: Never Smoker  . 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 Alcohol use Yes   No Known Allergies Family History  Problem Relation Age of Onset  . Hyperlipidemia Mother   . Hypertension Mother   . Depression Mother   . Hypertension Father   . Bipolar  disorder Sister     x 2   '  Past medical history, social, surgical and family history all reviewed in electronic medical record.   Review of Systems: No headache, visual changes, nausea, vomiting, diarrhea, constipation, dizziness, abdominal pain, skin rash, fevers, chills, night sweats, weight loss, swollen lymph nodes, body aches, joint swelling, muscle aches, chest pain, shortness of breath, mood changes.    Objective  Blood pressure 118/82, pulse (!) 56, resp. rate 16, weight 178 lb 4 oz (80.9 kg), SpO2 98 %.  Systems examined below as of 01/15/17 General: NAD A&O x3 mood, affect normal  HEENT: Pupils equal, extraocular movements intact no nystagmus Respiratory: not short of breath at rest or with speaking Cardiovascular: No lower extremity edema, non tender Skin: Warm dry intact with no signs of infection or rash on extremities or on axial skeleton. Abdomen: Soft nontender, no masses Neuro: Cranial nerves  intact, neurovascularly intact in all extremities with 2+ DTRs and 2+ pulses. Lymph: No lymphadenopathy appreciated today  Gait normal with good balance and coordination.  MSK: Non tender with full range of motion and good stability and symmetric strength and tone of shoulders, elbows, wrist,  knee hips and ankles bilaterally.    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 Back Exam:  Inspection: Unremarkable  Motion: Flexion 40 deg Extension 25 deg continued tightness no significant change, Side Bending to 35 deg bilaterally,  Rotation to 35 deg bilaterally SLR laying: Tightness of the left side than usual. XSLR laying: Negative Increased tenderness to palpation in the paraspinal musculature of the lumbar spine mostly over the lumbosacral area mostly on the left side.  Faber test positive left side. Sensory change: Gross sensation intact to all lumbar and sacral dermatomes.  Reflexes: 2+ at both patellar  tendons, 2+ at achilles tendons, Babinski's downgoing.  Strength at foot  Plantar-flexion: 5/5 Dorsi-flexion: 5/5 Eversion: 5/5 Inversion: 5/5  Leg strength  Quad: 5/5 Hamstring: 5/5\symmetric   Hip flexor: 5/5 Hip abductors: 4+ out of 5 but symmetric Gait unremarkable.  Osteopathic findings C2 flexed rotated and side bent right C4 flexed rotated and side bent left C6 flexed rotated and side bent right T3 extended rotated and side bent right inhaled third rib T9 extended rotated and side bent left L2 flexed rotated and side bent right Sacrum left on left      Impression and Recommendations:     This case required medical decision making of moderate complexity.

## 2017-01-15 NOTE — [facility] Notes (Signed)
Pre-visit discussion using our clinic review tool. No additional management support is needed unless otherwise documented below in the visit note.  

## 2017-01-15 NOTE — Assessment & Plan Note (Signed)
Patient is more of a piriformis syndrome on the left sign. Patient may have more of an impingement. Screws patient at great length. Patient will increase her Effexor. I do think that this could be predicted potentially plan overall. Patient continued to go to formal physical therapy. We started gabapentin at night. Worsening symptoms we'll need to consider further workup again. Possible candidate for injection as well.

## 2017-01-15 NOTE — Patient Instructions (Signed)
increase effexor to 75 mg daily  Gabapentin 100mg  at night Zanaflex when needed Make sure you eat 60grams of carbs and 20 grams of protein after exercise

## 2017-01-22 NOTE — [facility] Notes (Signed)
[name] [address] [place], [place] [address] Phone: [phone] Subjective:    CC: back pain follow-up.   [id number]  Shelby [name] is a 41 y.o. female coming in with complaint of back pain.patient did have an acute lumbar radiculopathy multiple months ago.    Patient has sacroiliac dysfunction. Patient is having worsening symptoms. Patient was having radicular symptoms as well. Seem to be intermittent. Pain is worsening nonpitting controlled with prednisone. Patient did have an MRI. MRI showed mild lumbar disc degeneration at L4-S1 as well as a small right paracentral disc herniation causing compression of the right L5 nerve root. Patient then on 05/23/2016 had a epidural L4-L5.  08/04/15 updatePatient still states that her right side is feeling significant a better. No significant pain. Continues to work out fairly regularly. Some mild increasing tightness recently. She takes a muscle relaxer at night she seems to do very well.  09/07/2016 update- Overall patient seems to be doing relatively well but is having increasing frequency of every day radiation down the leg somewhat. Not as severe as what it was but significantly worsened when she has been recently. Patient is having more tightness of the middle back as well as. Patient is wanting to go to the [place] for a yoga trip and wants to make sure that she is feeling good. Patient is wondering if she should have a repeat injection because it did seem to help out significantly  10/17/2016. Patient did have another epidural. Did not have as much improvement. Didn't have improvement overall. Patient has increased her activity doing more yoga and tennis and has noticed some mild increase in tightness but not having any true radicular symptoms at this time. [name] daily activities just fine. No severe pain when she's had previously. Seems buckling 24 hours after exercises she starts having more discomfort and  pain..  12/12/16 updated. Had been doing very good. Has been 2 months since we seen patient. Patient states over the course last week started having increasing tightness again. Patient states that she was doing a stress and felt pain popping sensation. States that she had another audible pop occurred when she was at church. States that this is very painful overall. Describes pain as a dull, throbbing aching sensation. Seems to be on the right side. Not like her back. Seems to be lower.   01/02/2017 update-patient has start with physical therapy. Is doing dry needling which has found to be helpful even though it is painful. States that it seems to be less going down the leg at this time. Less tightness as well.  January 15 2017 update-patient started having more pain on the left side. [facility] Patient did do a significant amount walking well there. Patient has been having more of a spasm in the left buttocks. This is the opposite side from usual. Patient states that in the morning she feels like she has weakness in the leg. With increasing activity it seems to get better. Continues to have a dull throbbing aching sensation in the buttocks area. No fall or any trauma.  01/23/2017 update-patient was having more pain on the left sign. We attempted to increase patient's Effexor. Patient was continued on the medications and continue to monitor. Patient states no significant side effects of medication. Unfortunately continued to have more pain. Seems to be on the left side. Patient states that she is having more spasms in the buttocks. States that even something simple as  walking long distances causes her to happen. Seems to be after the activity. Denies going down the seems to stay in the buttocks region.  Past Medical History:  Diagnosis Date  . Anxiety   . GERD (gastroesophageal reflux disease)       Past Medical History:  Diagnosis Date  . Anxiety   . GERD (gastroesophageal reflux  disease)    Past Surgical History:  Procedure Laterality Date  . Cyst remove  12/2009  . INTRAUTERINE DEVICE INSERTION  12/2009   Social History  Substance Use Topics  . Smoking status: Never Smoker  . 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 Alcohol use Yes   No Known Allergies Family History  Problem Relation Age of Onset  . Hyperlipidemia Mother   . Hypertension Mother   . Depression Mother   . Hypertension Father   . Bipolar disorder Sister     x 2   '  Past medical history, social, surgical and family history all reviewed in electronic medical record.   Review of Systems: No headache, visual changes, nausea, vomiting, diarrhea, constipation, dizziness, abdominal pain, skin rash, fevers, chills, night sweats, weight loss, swollen lymph nodes, body aches, joint swelling, muscle aches, chest pain, shortness of breath, mood changes.     Objective  Blood pressure 118/82, pulse (!) 56, resp. rate (!) 98, weight 178 lb (80.7 kg).  Systems examined below as of 01/23/17 General: NAD A&O x3 mood, affect normal  HEENT: Pupils equal, extraocular movements intact no nystagmus Respiratory: not short of breath at rest or with speaking Cardiovascular: No lower extremity edema, non tender Skin: Warm dry intact with no signs of infection or rash on extremities or on axial skeleton. Abdomen: Soft nontender, no masses Neuro: Cranial nerves  intact, neurovascularly intact in all extremities with 2+ DTRs and 2+ pulses. Lymph: No lymphadenopathy appreciated today  Gait mild antalgic gait MSK: Non tender with full range of motion and good stability and symmetric strength and tone of shoulders, elbows, wrist,  knee hips and ankles bilaterally.     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 Back Exam:  Inspection: Unremarkable    Motion: Flexion 40 deg Extension 25 deg continued tightness no significant change, Side Bending to 35 deg bilaterally,  Rotation to 35 deg bilaterally SLR laying: Tightness of the left side worsening XSLR laying: Negative Increased tenderness over the left piriformis.  Faber test positive left side. Severe tenderness in the piriformis Sensory change: Gross sensation intact to all lumbar and sacral dermatomes.  Reflexes: 2+ at both patellar tendons, 2+ at achilles tendons, Babinski's downgoing.  Strength at foot  Plantar-flexion: 5/5 Dorsi-flexion: 5/5 Eversion: 5/5 Inversion: 5/5  Leg strength  Quad: 5/5 Hamstring: 5/5\symmetric   Hip flexor: 5/5 Hip abductors: 4+ out of 5 but symmetric Gait unremarkable.  Osteopathic findings Cervical C2 flexed rotated and side bent right C4 flexed rotated and side bent left C6 flexed rotated and side bent left T3 extended rotated and side bent right inhaled third rib T9 extended rotated and side bent left L2 flexed rotated and side bent right Sacrum right on right   After verbal consent patient was prepped with alcohol swab and with a 21-gauge 3 inch needle patient was injected with a total of 2 mL of 0.5% Marcaine and 1  mL of Kenalog 40 mg/dL in the left piriformis muscle. This is done under ultrasound. Mild improvement in pain. Continued to have muscle spasm.    Impression and Recommendations:     This case required medical decision making of moderate complexity.

## 2017-01-23 ENCOUNTER — Encounter: Payer: Self-pay | Admitting: Family Medicine

## 2017-01-23 ENCOUNTER — Ambulatory Visit (INDEPENDENT_AMBULATORY_CARE_PROVIDER_SITE_OTHER): Payer: 59 | Admitting: Family Medicine

## 2017-01-23 VITALS — BP 118/82 | HR 56 | Resp 98 | Wt 178.0 lb

## 2017-01-23 DIAGNOSIS — M533 Sacrococcygeal disorders, not elsewhere classified: Secondary | ICD-10-CM

## 2017-01-23 DIAGNOSIS — M999 Biomechanical lesion, unspecified: Secondary | ICD-10-CM | POA: Diagnosis not present

## 2017-01-23 DIAGNOSIS — G5702 Lesion of sciatic nerve, left lower limb: Secondary | ICD-10-CM

## 2017-01-23 MED ORDER — DOXYCYCLINE HYCLATE 100 MG PO TABS: 100.0000 mg | ORAL_TABLET | Freq: Two times a day (BID) | ORAL | 0 refills | Status: AC

## 2017-01-23 MED FILL — DOXYCYCLINE HYCLATE 100 MG: 100 | 7 days supply | Qty: 14 | Fill #0

## 2017-01-23 NOTE — Assessment & Plan Note (Signed)
I believe that some of this is a prone. Possibility of radicular symptoms. Increased patient's Effexor previously as well as the gabapentin. We will continue to moderate. Patient come back and see me again in 2-3 weeks

## 2017-01-23 NOTE — [facility] Notes (Signed)
Procedure Note   Patient was fitted for a : standard, cushioned, semi-rigid orthotic. The orthotic was heated and afterward the patient patient seated position and molded The patient was positioned in subtalar neutral position and 10 degrees of ankle dorsiflexion in a weight bearing stance. After completion of molding, patient did have orthotic management The blank was ground to a stable position for weight bearing. Size: Base: Carbon fiber Additional Posting and Padding:  The patient ambulated these, and they were very comfortable.  

## 2017-01-23 NOTE — Assessment & Plan Note (Signed)
Decision today to treat with OMT was based on Physical Exam  After verbal consent patient was treated with HVLA, ME, FPR techniques in cervical, thoracic, lumbar and sacral areas  Patient tolerated the procedure well with improvement in symptoms  Patient given exercises, stretches and lifestyle modifications  See medications in patient instructions if given  Patient will follow up in 2-3 weeks 

## 2017-01-23 NOTE — Assessment & Plan Note (Signed)
She given injection today and tolerated the procedure well. We discussed icing regimen and home exercises. We discussed which activities to do in which ones to avoid. Patient will start increasing activity as tolerated and will follow-up with me again in 2-3 weeks.

## 2017-01-23 NOTE — Patient Instructions (Addendum)
Sorry for the pain in the ass ;) Ice is your friend.  Send me a message on Friday and tell me how you are doing.  Otherwise may need another injection in the back.  Consider about the epidural  See me again in 3 weeks

## 2017-02-03 ENCOUNTER — Encounter: Payer: Self-pay | Admitting: Family Medicine

## 2017-02-04 ENCOUNTER — Encounter: Payer: Self-pay | Admitting: Family Medicine

## 2017-02-05 ENCOUNTER — Encounter: Payer: Self-pay | Admitting: Family Medicine

## 2017-02-05 ENCOUNTER — Ambulatory Visit (INDEPENDENT_AMBULATORY_CARE_PROVIDER_SITE_OTHER): Payer: 59 | Admitting: Family Medicine

## 2017-02-05 VITALS — BP 138/92 | HR 94 | Resp 16 | Wt 178.4 lb

## 2017-02-05 DIAGNOSIS — M533 Sacrococcygeal disorders, not elsewhere classified: Secondary | ICD-10-CM | POA: Diagnosis not present

## 2017-02-05 DIAGNOSIS — M25552 Pain in left hip: Secondary | ICD-10-CM | POA: Diagnosis not present

## 2017-02-05 DIAGNOSIS — M5416 Radiculopathy, lumbar region: Secondary | ICD-10-CM

## 2017-02-05 MED ORDER — PREDNISONE 50 MG PO TABS: 50.0000 mg | ORAL_TABLET | Freq: Every day | ORAL | 0 refills | Status: DC

## 2017-02-05 MED ORDER — METHYLPREDNISOLONE ACETATE 80 MG/ML IJ SUSP
80.0000 mg | Freq: Once | INTRAMUSCULAR | Status: AC
  Administered 2017-02-05: 80 mg via INTRAMUSCULAR

## 2017-02-05 MED ORDER — KETOROLAC TROMETHAMINE 60 MG/2ML IM SOLN
60.0000 mg | Freq: Once | INTRAMUSCULAR | Status: AC
  Administered 2017-02-05: 60 mg via INTRAMUSCULAR

## 2017-02-05 NOTE — [facility] Notes (Signed)
[name] [address], [place] [address] Phone: [phone] Subjective:    CC: back pain follow-up.   [id number]  Shelby [name] is a 41 y.o. female coming in with complaint of back pain.patient did have an acute lumbar radiculopathy multiple months ago.    Patient has sacroiliac dysfunction. Patient is having worsening symptoms. Patient was having radicular symptoms as well. Seem to be intermittent. Pain is worsening nonpitting controlled with prednisone. Patient did have an MRI. MRI showed mild lumbar disc degeneration at L4-S1 as well as a small right paracentral disc herniation causing compression of the right L5 nerve root. Patient then on 05/23/2016 had a epidural L4-L5.  08/04/15 updatePatient still states that her right side is feeling significant a better. No significant pain. Continues to work out fairly regularly. Some mild increasing tightness recently. She takes a muscle relaxer at night she seems to do very well.  09/07/2016 update- Overall patient seems to be doing relatively well but is having increasing frequency of every day radiation down the leg somewhat. Not as severe as what it was but significantly worsened when she has been recently. Patient is having more tightness of the middle back as well as. Patient is wanting to go to the [place] for a yoga trip and wants to make sure that she is feeling good. Patient is wondering if she should have a repeat injection because it did seem to help out significantly  10/17/2016. Patient did have another epidural. Did not have as much improvement. Didn't have improvement overall. Patient has increased her activity doing more yoga and tennis and has noticed some mild increase in tightness but not having any true radicular symptoms at this time. [name] daily activities just fine. No severe pain when she's had previously. Seems buckling 24 hours after exercises she starts having more discomfort and  pain..  12/12/16 updated. Had been doing very good. Has been 2 months since we seen patient. Patient states over the course last week started having increasing tightness again. Patient states that she was doing a stress and felt pain popping sensation. States that she had another audible pop occurred when she was at church. States that this is very painful overall. Describes pain as a dull, throbbing aching sensation. Seems to be on the right side. Not like her back. Seems to be lower.   01/02/2017 update-patient has start with physical therapy. Is doing dry needling which has found to be helpful even though it is painful. States that it seems to be less going down the leg at this time. Less tightness as well.  January 15 2017 update-patient started having more pain on the left side. Patient [facility] Patient did do a significant amount walking well there. Patient has been having more of a spasm in the left buttocks. This is the opposite side from usual. Patient states that in the morning she feels like she has weakness in the leg. With increasing activity it seems to get better. Continues to have a dull throbbing aching sensation in the buttocks area. No fall or any trauma.  01/23/2017 update-patient was having more pain on the left sign. We attempted to increase patient's Effexor. Patient was continued on the medications and continue to monitor. Patient states no significant side effects of medication. Unfortunately continued to have more pain. Seems to be on the left side. Patient states that she is having more spasms in the buttocks. States that even something simple as  walking long distances causes her to happen. Seems to be after the activity. Denies going down the seems to stay in the buttocks region.  02/05/2017 update. Patient's pain is seems to be worsening. More on the left side. Patient was seen before and did have a piriformis injection. Patient states that that helped for about a week  where she was nearly and completely resolved the pain but now is having significant worsening symptoms again. Patient states that now it seems to be more of a catching sensation. Only hurts with certain movements. States that most of time seems to do well but does have some pain with ambulation. Mild radiation down the posterior aspect of the leg but seems to go into the buttocks and more on the lateral aspect of the hip. Patient has been taking tramadol, Flexeril, gabapentin, Tylenol just to keep the pain at [place].  Past Medical History:  Diagnosis Date  . Anxiety   . GERD (gastroesophageal reflux disease)       Past Medical History:  Diagnosis Date  . Anxiety   . GERD (gastroesophageal reflux disease)    Past Surgical History:  Procedure Laterality Date  . Cyst remove  12/2009  . INTRAUTERINE DEVICE INSERTION  12/2009   Social History  Substance Use Topics  . Smoking status: Never Smoker  . 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 Alcohol use Yes   No Known Allergies Family History  Problem Relation Age of Onset  . Hyperlipidemia Mother   . Hypertension Mother   . Depression Mother   . Hypertension Father   . Bipolar disorder Sister     x 2   '  Past medical history, social, surgical and family history all reviewed in electronic medical record.   Review of Systems: No headache, visual changes, nausea, vomiting, diarrhea, constipation, dizziness, abdominal pain, skin rash, fevers, chills, night sweats, weight loss, swollen lymph nodes, body aches, joint swelling, muscle aches, chest pain, shortness of breath, mood changes.    Objective  There were no vitals taken for this visit.  Systems examined below as of 02/05/17 General: NAD A&O x3 mood, affect normal  HEENT: Pupils equal, extraocular movements intact no nystagmus Respiratory: not short of breath at rest or with  speaking Cardiovascular: No lower extremity edema, non tender Skin: Warm dry intact with no signs of infection or rash on extremities or on axial skeleton. Abdomen: Soft nontender, no masses Neuro: Cranial nerves  intact, neurovascularly intact in all extremities with 2+ DTRs and 2+ pulses. Lymph: No lymphadenopathy appreciated today  Antalgic gait noted  MSK: Non tender with full range of motion and good stability and symmetric strength and tone of shoulders, elbows, wrist,  knee hips and ankles bilaterally.   Back Exam:  Inspection: Unremarkable  Motion: Flexion 30 deg with worsening symptoms Extension 25 deg continued tightness no significant change, Side Bending to 35 deg bilaterally,  Rotation to 35 deg bilaterally SLR laying: Positive straight leg test on the left XSLR laying: Negative Increased tenderness over the left piriformis.  Faber test positive left side. No tenderness over the piriformis noted today. Reflexes: 2+ at both patellar tendons, 2+ at achilles tendons, Babinski's downgoing.  Strength at foot  Very mild weakness of the left foot compared to the right foot with 4 out of 5 dorsiflexion.      Impression and Recommendations:  This case required medical decision making of moderate complexity.

## 2017-02-05 NOTE — [facility] Notes (Signed)
Pre-visit discussion using our clinic review tool. No additional management support is needed unless otherwise documented below in the visit note.  

## 2017-02-05 NOTE — Assessment & Plan Note (Signed)
I believe the patient is having lumbar radiculopathy more on the left side at this time. Patient did have a very mild improvement with the piriformis injection but I do not think that it was truly an improvement. I believe the patient's previous herniated disc is likely going more on the left side at this time. Patient given 2 injections today, discussed the possibility of prednisone again and given refill. Patient will be set up for an epidural at L4-L5 that I think be beneficial. Patient will continue the other medications at this time and if no significant improvement we'll consider other changes.  Spent  25 minutes with patient face-to-face and had greater than 50% of counseling including as described above in assessment and plan.

## 2017-02-05 NOTE — Patient Instructions (Signed)
Likely back  Will do epidural  Ice 20 minutes 2 times daily. Usually after activity and before bed. 2 injections today  Prednisone +/- tomorrow Gabapentin 300mg  at night Tramadol with tylenol up to 2 times a day  See me again 1-2 weeks after epidural and PLEASE send me a message after the injection.

## 2017-02-08 ENCOUNTER — Ambulatory Visit
Admission: RE | Admit: 2017-02-08 | Discharge: 2017-02-08 | Disposition: A | Discharge status: Home / Self Care | Payer: 59 | Source: Ambulatory Visit | Attending: Family Medicine | Admitting: Family Medicine

## 2017-02-08 ENCOUNTER — Other Ambulatory Visit: Payer: Self-pay | Admitting: Obstetrics and Gynecology

## 2017-02-08 DIAGNOSIS — M545 Low back pain: Secondary | ICD-10-CM | POA: Diagnosis not present

## 2017-02-08 DIAGNOSIS — M5416 Radiculopathy, lumbar region: Secondary | ICD-10-CM

## 2017-02-08 DIAGNOSIS — N6489 Other specified disorders of breast: Secondary | ICD-10-CM

## 2017-02-08 MED ORDER — IOPAMIDOL (ISOVUE-M 200) INJECTION 41%
1.0000 mL | Freq: Once | INTRAMUSCULAR | Status: AC
  Administered 2017-02-08: 1 mL via EPIDURAL

## 2017-02-08 MED ORDER — METHYLPREDNISOLONE ACETATE 40 MG/ML INJ SUSP (RADIOLOG
120.0000 mg | Freq: Once | INTRAMUSCULAR | Status: AC
  Administered 2017-02-08: 120 mg via EPIDURAL

## 2017-02-08 NOTE — Discharge Instructions (Signed)

## 2017-02-25 MED FILL — tiZANidine HCL 4 MG TABS: 4 | 90 days supply | Qty: 90 | Fill #1

## 2017-02-25 MED FILL — VENLAFAXINE HCL ER 75 MG CA: 75 | 30 days supply | Qty: 30 | Fill #1

## 2017-03-18 ENCOUNTER — Encounter: Payer: Self-pay | Admitting: Family Medicine

## 2017-03-19 ENCOUNTER — Encounter: Payer: Self-pay | Admitting: Family Medicine

## 2017-03-19 ENCOUNTER — Ambulatory Visit (INDEPENDENT_AMBULATORY_CARE_PROVIDER_SITE_OTHER): Payer: 59 | Admitting: Family Medicine

## 2017-03-19 VITALS — BP 124/84 | HR 56

## 2017-03-19 DIAGNOSIS — M5416 Radiculopathy, lumbar region: Secondary | ICD-10-CM | POA: Diagnosis not present

## 2017-03-19 DIAGNOSIS — M999 Biomechanical lesion, unspecified: Secondary | ICD-10-CM

## 2017-03-19 NOTE — Assessment & Plan Note (Signed)
Decision today to treat with OMT was based on Physical Exam  After verbal consent patient was treated with HVLA, ME, FPR techniques in cervical, thoracic, lumbar and sacral areas  Patient tolerated the procedure well with improvement in symptoms  Patient given exercises, stretches and lifestyle modifications  See medications in patient instructions if given  Patient will follow up in 4 weeks 

## 2017-03-19 NOTE — Patient Instructions (Addendum)
Good to see you  I am glad we are doing well.  Ice is your friend.  Stay active but listen to your body  See me again within 3 weeks.

## 2017-03-19 NOTE — [facility] Notes (Signed)
[name] [address] [place], [place] [address] Phone: [phone] Subjective:    I'm seeing this patient by the request  of:    CC: Low back pain follow-up  [id number]  Shelby [name] is a 41 y.o. female coming in with complaint of low back pain. Patient has been seen previously for more of a lumbar radiculopathy. Last time we saw patient. Having significantly worsening symptoms. Patient elected to have another epidural done. This was done the fourth 2018. Patient has had 2 epidurals done since December 2017. Patient was to continue with conservative therapy. Patient states has been nearly pain-free until last 2 weeks. Has some tightness recently. Seems to be worsening in the morning. Seems to be the first symptoms before patient starts having worsening pain. Has done yoga couple times since injection and played tennis months. Has been mowing her yard and doing chores around the house. States that no significant pain with that. Continues to do the stretching somewhat. Been icing occasionally. Has not been back to physical therapy.     Past Medical History:  Diagnosis Date  . Anxiety   . GERD (gastroesophageal reflux disease)    Past Surgical History:  Procedure Laterality Date  . Cyst remove  12/2009  . INTRAUTERINE DEVICE INSERTION  12/2009   Social History   Social History  . Marital status: Married    Spouse name: N/A  . Number of children: N/A  . Years of education: N/A   Social History Main Topics  . Smoking status: Never Smoker  . 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 Alcohol use Yes  . Drug use: No  . Sexual activity: Not Asked   Other Topics Concern  . None   Social History Narrative  . None   No Known Allergies Family History  Problem Relation Age of Onset  . Hyperlipidemia Mother   . Hypertension Mother   . Depression  Mother   . Hypertension Father   . Bipolar disorder Sister        x 2    Past medical history, social, surgical and family history all reviewed in electronic medical record.  No pertanent information unless stated regarding to the chief complaint.   Review of Systems:Review of systems updated and as accurate as of 03/19/17  No headache, visual changes, nausea, vomiting, diarrhea, constipation, dizziness, abdominal pain, skin rash, fevers, chills, night sweats, weight loss, swollen lymph nodes, body aches, joint swelling, muscle aches, chest pain, shortness of breath, mood changes.   Objective  Blood pressure 124/84, pulse (!) 56, SpO2 98 %. Systems examined below as of 03/19/17   General: No apparent distress alert and oriented x3 mood and affect normal, dressed appropriately.  HEENT: Pupils equal, extraocular movements intact  Respiratory: Patient's speak in full sentences and does not appear short of breath  Cardiovascular: No lower extremity edema, non tender, no erythema  Skin: Warm dry intact with no signs of infection or rash on extremities or on axial skeleton.  Abdomen: Soft nontender  Neuro: Cranial nerves II through XII are intact, neurovascularly intact in all extremities with 2+ DTRs and 2+ pulses.  Lymph: No lymphadenopathy of posterior or anterior cervical chain or axillae bilaterally.  Gait normal with good balance and coordination.  MSK:  Non tender with full range of motion and good stability and symmetric  strength and tone of shoulders, elbows, wrist, hip, knee and ankles bilaterally.  Back Exam:  Inspection: Unremarkable  Motion: Flexion 45 deg, Extension 25 deg, Side Bending to 45 deg bilaterally,  Rotation to 45 deg bilaterally  SLR laying: Negative  XSLR laying: Negative  Palpable tenderness: Mild tenderness to palpation of the paraspinal musculature lumbar spine, the piriformis muscle on the right side, as well as the obturator of the right side. FABER: Positive  right. Sensory change: Gross sensation intact to all lumbar and sacral dermatomes.  Reflexes: 2+ at both patellar tendons, 2+ at achilles tendons, Babinski's downgoing.  Strength at foot  Plantar-flexion: 5/5 Dorsi-flexion: 5/5 Eversion: 5/5 Inversion: 5/5  Leg strength  Quad: 5/5 Hamstring: 5/5 Hip flexor: 5/5 Hip abductors: 5/5  Gait unremarkable.  Osteopathic findings C2 flexed rotated and side bent right C4 flexed rotated and side bent left C7 flexed rotated and side bent left T3 extended rotated and side bent right inhaled third rib T9 extended rotated and side bent left L2 flexed rotated and side bent right Sacrum right on right    Impression and Recommendations:     This case required medical decision making of moderate complexity.      Note: This dictation was prepared with Dragon dictation along with smaller phrase technology. Any transcriptional errors that result from this process are unintentional.

## 2017-03-19 NOTE — Assessment & Plan Note (Signed)
Patient is starting have a more somewhat lumbar radiculopathy. Discussed with patient that she could be continued for a microdiscectomy. Patient was to avoid any type of surgery. Attempted manipulation again. Increase patient's activity as tolerated. We'll consider going back for more dry needling. Follow-up again in 4 weeks.

## 2017-03-21 ENCOUNTER — Ambulatory Visit
Admission: RE | Admit: 2017-03-21 | Discharge: 2017-03-21 | Disposition: A | Discharge status: Home / Self Care | Payer: 59 | Source: Ambulatory Visit | Attending: Obstetrics and Gynecology | Admitting: Obstetrics and Gynecology

## 2017-03-21 ENCOUNTER — Other Ambulatory Visit: Payer: Self-pay | Admitting: Obstetrics and Gynecology

## 2017-03-21 DIAGNOSIS — Z09 Encounter for follow-up examination after completed treatment for conditions other than malignant neoplasm: Secondary | ICD-10-CM

## 2017-03-21 DIAGNOSIS — R928 Other abnormal and inconclusive findings on diagnostic imaging of breast: Secondary | ICD-10-CM | POA: Diagnosis not present

## 2017-03-21 DIAGNOSIS — N6489 Other specified disorders of breast: Secondary | ICD-10-CM

## 2017-03-22 ENCOUNTER — Other Ambulatory Visit: Payer: Self-pay | Admitting: Obstetrics and Gynecology

## 2017-03-22 DIAGNOSIS — N6489 Other specified disorders of breast: Secondary | ICD-10-CM

## 2017-03-26 ENCOUNTER — Encounter: Payer: Self-pay | Admitting: Family Medicine

## 2017-03-26 MED ORDER — VENLAFAXINE HCL ER 75 MG PO CP24: 75.0000 mg | ORAL_CAPSULE | Freq: Every day | ORAL | 1 refills | Status: DC

## 2017-03-26 MED FILL — VENLAFAXINE HCL ER 75 MG CA: 75 | 90 days supply | Qty: 90 | Fill #0

## 2017-04-11 ENCOUNTER — Ambulatory Visit (INDEPENDENT_AMBULATORY_CARE_PROVIDER_SITE_OTHER): Payer: 59 | Admitting: Family Medicine

## 2017-04-11 ENCOUNTER — Encounter: Payer: Self-pay | Admitting: Family Medicine

## 2017-04-11 VITALS — BP 112/82 | HR 75

## 2017-04-11 DIAGNOSIS — M999 Biomechanical lesion, unspecified: Secondary | ICD-10-CM

## 2017-04-11 DIAGNOSIS — M533 Sacrococcygeal disorders, not elsewhere classified: Secondary | ICD-10-CM | POA: Diagnosis not present

## 2017-04-11 NOTE — Assessment & Plan Note (Signed)
Decision today to treat with OMT was based on Physical Exam  After verbal consent patient was treated with HVLA, ME, FPR techniques in cervical, thoracic, lumbar and sacral areas  Patient tolerated the procedure well with improvement in symptoms  Patient given exercises, stretches and lifestyle modifications  See medications in patient instructions if given  Patient will follow up in 4-6 weeks 

## 2017-04-11 NOTE — Assessment & Plan Note (Signed)
Stable overall. We will continue to monitor. Patient is having no radicular symptoms. Increase activity as tolerated. Continue the same medications. Follow-up again in 4-6 weeks

## 2017-04-11 NOTE — [facility] Notes (Signed)
[name] [address] [place], [place] [address] Phone: [phone] Subjective:     CC: Low back pain follow-up  [id number]  Shelby [name] is a 41 y.o. female coming in with complaint of low back pain. Patient has been seen previously for more of a lumbar radiculopathy. Last time we saw patient. Having significantly worsening symptoms. Patient elected to have another epidural done. This was done the fourth 2018. Patient has had 2 epidurals done since December 2017. Patient was to continue with conservative therapy.  Patient has been doing remarkably well since last time. Patient was started on Singulair. Patient states that she is having very minimal pain overall. Being active. Doing the stretches regularly. Happy with the results.     Past Medical History:  Diagnosis Date  . Anxiety   . GERD (gastroesophageal reflux disease)    Past Surgical History:  Procedure Laterality Date  . Cyst remove  12/2009  . INTRAUTERINE DEVICE INSERTION  12/2009   Social History   Social History  . Marital status: Married    Spouse name: N/A  . Number of children: N/A  . Years of education: N/A   Social History Main Topics  . Smoking status: Never Smoker  . 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 Alcohol use Yes  . Drug use: No  . Sexual activity: Not Asked   Other Topics Concern  . None   Social History Narrative  . None   No Known Allergies Family History  Problem Relation Age of Onset  . Hyperlipidemia Mother   . Hypertension Mother   . Depression Mother   . Hypertension Father   . Bipolar disorder Sister        x 2    Past medical history, social, surgical and family history all reviewed in electronic medical record.  No pertanent information unless stated regarding to the chief complaint.   Review of Systems: No headache, visual changes,  nausea, vomiting, diarrhea, constipation, dizziness, abdominal pain, skin rash, fevers, chills, night sweats, weight loss, swollen lymph nodes, body aches, joint swelling, chest pain, shortness of breath, mood changes. Mild positive muscle aches   Objective  Blood pressure 112/82, pulse 75, SpO2 98 %.   Systems examined below as of 04/11/17 General: NAD A&O x3 mood, affect normal  HEENT: Pupils equal, extraocular movements intact no nystagmus Respiratory: not short of breath at rest or with speaking Cardiovascular: No lower extremity edema, non tender Skin: Warm dry intact with no signs of infection or rash on extremities or on axial skeleton. Abdomen: Soft nontender, no masses Neuro: Cranial nerves  intact, neurovascularly intact in all extremities with 2+ DTRs and 2+ pulses. Lymph: No lymphadenopathy appreciated today  Gait normal with good balance and coordination.  MSK: Non tender with full range of motion and good stability and symmetric strength and tone of shoulders, elbows, wrist,  knee hips and ankles bilaterally.    Back Exam:  Inspection: Unremarkable  Motion: Flexion 35 deg, Extension 25 deg, Side Bending to 35 deg bilaterally,  Rotation to 45 deg bilaterally  SLR laying: Negative  XSLR laying: Negative  Palpable tenderness: Tender to palpation in the paraspinal musculature lumbar spine FABER: Positive right. Sensory change: Gross sensation intact to all lumbar and sacral dermatomes.  Reflexes: 2+ at both patellar tendons, 2+ at achilles tendons, Babinski's downgoing.  Strength at foot  Plantar-flexion: 5/5 Dorsi-flexion: 5/5 Eversion: 5/5 Inversion: 5/5  Leg strength  Quad: 5/5 Hamstring: 5/5 Hip flexor: 5/5 Hip abductors: 5/5  Gait unremarkable.  Osteopathic findings C4 flexed rotated and side bent left C7 flexed rotated and side bent left T3 extended rotated and side bent right inhaled third rib T9 extended rotated and side bent left L3 flexed rotated and side  bent right Sacrum right on right     Impression and Recommendations:     This case required medical decision making of moderate complexity.      Note: This dictation was prepared with Dragon dictation along with smaller phrase technology. Any transcriptional errors that result from this process are unintentional.

## 2017-04-19 ENCOUNTER — Encounter: Payer: Self-pay | Admitting: Family Medicine

## 2017-04-19 NOTE — Telephone Encounter (Signed)
I almost sent a message but I have a feeling you would work her in.

## 2017-04-23 ENCOUNTER — Encounter: Payer: Self-pay | Admitting: Family Medicine

## 2017-04-23 ENCOUNTER — Ambulatory Visit (INDEPENDENT_AMBULATORY_CARE_PROVIDER_SITE_OTHER): Payer: 59 | Admitting: Family Medicine

## 2017-04-23 DIAGNOSIS — M7061 Trochanteric bursitis, right hip: Secondary | ICD-10-CM | POA: Diagnosis not present

## 2017-04-23 NOTE — [facility] Notes (Signed)
[name] [address] [place], [place] [address] Phone: [phone] Subjective:    I'm seeing this patient by the request  of:    CC: right hip pain   XKG:[id number]  Shelby [name] is a 41 y.o. female coming in with complaint of right hip pain, lateral aspect, different then her back.  Patient states it seems be waking her up at night. Concern that she has a long car ride coming up.Patient states that sometimes her some radiation going down the leg but very intermittent. Does not seem as her back. Patient rates the severity pain is 8 out of 10.Still not been very active at this time.     Past Medical History:  Diagnosis Date  . Anxiety   . GERD (gastroesophageal reflux disease)    Past Surgical History:  Procedure Laterality Date  . Cyst remove  12/2009  . INTRAUTERINE DEVICE INSERTION  12/2009   Social History   Social History  . Marital status: Married    Spouse name: N/A  . Number of children: N/A  . Years of education: N/A   Social History Main Topics  . Smoking status: Never Smoker  . 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 Alcohol use Yes  . Drug use: No  . Sexual activity: Not on file   Other Topics Concern  . Not on file   Social History Narrative  . No narrative on file   No Known Allergies Family History  Problem Relation Age of Onset  . Hyperlipidemia Mother   . Hypertension Mother   . Depression Mother   . Hypertension Father   . Bipolar disorder Sister        x 2    Past medical history, social, surgical and family history all reviewed in electronic medical record.  No pertanent information unless stated regarding to the chief complaint.   Review of Systems:Review of systems updated and as accurate as of 04/23/17  No headache, visual changes, nausea, vomiting, diarrhea, constipation, dizziness, abdominal pain, skin  rash, fevers, chills, night sweats, weight loss, swollen lymph nodes, body aches, joint swelling, muscle aches, chest pain, shortness of breath, mood changes.   Objective  Blood pressure 104/74, pulse 87, height 5' 9.5" (1.765 m), weight 181 lb (82.1 kg), SpO2 99 %. Systems examined below as of 04/23/17   General: No apparent distress alert and oriented x3 mood and affect normal, dressed appropriately.  HEENT: Pupils equal, extraocular movements intact  Respiratory: Patient's speak in full sentences and does not appear short of breath  Cardiovascular: No lower extremity edema, non tender, no erythema  Skin: Warm dry intact with no signs of infection or rash on extremities or on axial skeleton.  Abdomen: Soft nontender  Neuro: Cranial nerves II through XII are intact, neurovascularly intact in all extremities with 2+ DTRs and 2+ pulses.  Lymph: No lymphadenopathy of posterior or anterior cervical chain or axillae bilaterally.  Gait normal with good balance and coordination.  MSK:  Non tender with full range of motion and good stability and symmetric strength and tone of shoulders, elbows, wrist,  knee and ankles bilaterally.  Hip: right ROM IR: 15 Deg, ER: 35 Deg, Flexion: 120 Deg, Extension: 100 Deg, Abduction: 45 Deg, Adduction: 45 Deg Strength IR: 5/5, ER: 5/5, Flexion: 5/5, Extension: 5/5, Abduction: 4/5, Adduction: 5/5 Pelvic alignment  unremarkable to inspection and palpation. Standing hip rotation and gait without trendelenburg sign / unsteadiness. Ear tenderness noted over the greater trochanter area on the right GT area.  Positive FABER pain over the right sacroiliac joint    After verbal consent patient was prepped in sterile fashion with alcohol swabs. Ethyl chloride used patient was injected with a 22-gauge 3 inch needle into the right lateral hip in the greater trochanteric areae. Picture was taken. Patient had 4 cc of 0.5% Marcaine and 1 cc of Kenalog 40 mg/dL injected. Patient  tolerated the procedure well and no blood loss. Pain completely resolved after injection stating proper placement. Post injection instructions given.  Impression and Recommendations:     This case required medical decision making of moderate complexity.      Note: This dictation was prepared with Dragon dictation along with smaller phrase technology. Any transcriptional errors that result from this process are unintentional.

## 2017-04-23 NOTE — Patient Instructions (Signed)
Good to see you  Have fun?!?1 Ice is yuor friend.  Stay active Injected the hip  See me again in 3 weeks

## 2017-04-23 NOTE — Progress Notes (Signed)
Pre visit review using our clinic review tool, if applicable. No additional management support is needed unless otherwise documented below in the visit note. 

## 2017-04-23 NOTE — Assessment & Plan Note (Signed)
Given injection today. Tolerated the procedure well. We discussed icing regimen and home exercises. Continue the same medication. Follow-up again in 4 weeks. Worsening symptoms we will have to consider this be more of the radicular symptoms in her back but it does seem much more specific over the greater trochanteric area.

## 2017-05-01 DIAGNOSIS — L819 Disorder of pigmentation, unspecified: Secondary | ICD-10-CM | POA: Diagnosis not present

## 2017-05-01 DIAGNOSIS — D225 Melanocytic nevi of trunk: Secondary | ICD-10-CM | POA: Diagnosis not present

## 2017-05-01 DIAGNOSIS — D2262 Melanocytic nevi of left upper limb, including shoulder: Secondary | ICD-10-CM | POA: Diagnosis not present

## 2017-05-01 DIAGNOSIS — L814 Other melanin hyperpigmentation: Secondary | ICD-10-CM | POA: Diagnosis not present

## 2017-05-01 DIAGNOSIS — D1801 Hemangioma of skin and subcutaneous tissue: Secondary | ICD-10-CM | POA: Diagnosis not present

## 2017-05-01 DIAGNOSIS — D2261 Melanocytic nevi of right upper limb, including shoulder: Secondary | ICD-10-CM | POA: Diagnosis not present

## 2017-05-01 DIAGNOSIS — Z419 Encounter for procedure for purposes other than remedying health state, unspecified: Secondary | ICD-10-CM | POA: Diagnosis not present

## 2017-05-09 ENCOUNTER — Ambulatory Visit (INDEPENDENT_AMBULATORY_CARE_PROVIDER_SITE_OTHER): Payer: 59 | Admitting: Family Medicine

## 2017-05-09 ENCOUNTER — Encounter: Payer: Self-pay | Admitting: Family Medicine

## 2017-05-09 VITALS — BP 116/82 | HR 78 | Ht 69.5 in

## 2017-05-09 DIAGNOSIS — M999 Biomechanical lesion, unspecified: Secondary | ICD-10-CM

## 2017-05-09 DIAGNOSIS — M533 Sacrococcygeal disorders, not elsewhere classified: Secondary | ICD-10-CM

## 2017-05-09 DIAGNOSIS — M5416 Radiculopathy, lumbar region: Secondary | ICD-10-CM | POA: Diagnosis not present

## 2017-05-09 NOTE — [facility] Notes (Signed)
[name] [address], [place] [address] Phone: [phone] Subjective:    I'm seeing this patient by the request  of:    CC: right hip pain   [id number]  Shelby [name] is a 41 y.o. female coming in with complaint of right hip pain, lateral aspect, different then her back. Patient at last exam was having more pain over lateral hip Patient was given an injection in the greater trochanteric bursa. Patient states significant improvement. Continues to have pain though. Patient since then she does feel that the increase in the Effexor has been somewhat helpful. Patient though states that injection of the lateral aspect of the hip is help to but continues to have a little aching pain.     Past Medical History:  Diagnosis Date  . Anxiety   . GERD (gastroesophageal reflux disease)    Past Surgical History:  Procedure Laterality Date  . Cyst remove  12/2009  . INTRAUTERINE DEVICE INSERTION  12/2009   Social History   Social History  . Marital status: Married    Spouse name: N/A  . Number of children: N/A  . Years of education: N/A   Social History Main Topics  . Smoking status: Never Smoker  . Smokeless tobacco: Never Used     Comment: married, lives with spouse (GI doc at [employer]) and 2 kids - previous HS history teacher x 8yr  . Alcohol use Yes  . Drug use: No  . Sexual activity: Not Asked   Other Topics Concern  . None   Social History Narrative  . None   No Known Allergies Family History  Problem Relation Age of Onset  . Hyperlipidemia Mother   . Hypertension Mother   . Depression Mother   . Hypertension Father   . Bipolar disorder Sister        x 2    Past medical history, social, surgical and family history all reviewed in electronic medical record.  No pertanent information unless stated regarding to the chief complaint.   Review of Systems:Review of systems updated and as accurate as of 05/09/17  No headache, visual  changes, nausea, vomiting, diarrhea, constipation, dizziness, abdominal pain, skin rash, fevers, chills, night sweats, weight loss, swollen lymph nodes, body aches, joint swelling, chest pain, shortness of breath, mood changes. Positive muscle aches  Objective  Blood pressure 116/82, pulse 78, height 5' 9.5" (1.765 m), SpO2 98 %.   Systems examined below as of 05/09/17 General: NAD A&O x3 mood, affect normal  HEENT: Pupils equal, extraocular movements intact no nystagmus Respiratory: not short of breath at rest or with speaking Cardiovascular: No lower extremity edema, non tender Skin: Warm dry intact with no signs of infection or rash on extremities or on axial skeleton. Abdomen: Soft nontender, no masses Neuro: Cranial nerves  intact, neurovascularly intact in all extremities with 2+ DTRs and 2+ pulses. Lymph: No lymphadenopathy appreciated today  Gait normal with good balance and coordination.  MSK: Non tender with full range of motion and good stability and symmetric strength and tone of shoulders, elbows, wrist,  knee and ankles bilaterally.   Back Exam:  Inspection: Mild loss of lordosis Motion: Flexion 45 deg, Extension 25 deg, Side Bending to 35 deg bilaterally,  Rotation to 35 deg bilaterally  SLR laying: Negative  XSLR laying: Negative  Palpable tenderness: Tightness of the lower extremities hamstrings as well as lower back. FABER: negative. Sensory change: Gross sensation intact to all lumbar  and sacral dermatomes.  Reflexes: 2+ at both patellar tendons, 2+ at achilles tendons, Babinski's downgoing.  Strength at foot  Plantar-flexion: 5/5 Dorsi-flexion: 5/5 Eversion: 5/5 Inversion: 5/5  Leg strength  Quad: 5/5 Hamstring: 5/5 Hip flexor: 5/5 Hip abductors: 4/5  Gait unremarkable.  Osteopathic findings C2 flexed rotated and side bent right C4 flexed rotated and side bent left C6 flexed rotated and side bent left T3 extended rotated and side bent right inhaled third  rib T9 extended rotated and side bent left L2 flexed rotated and side bent right L4 flexed rotated and side bent left Sacrum right on right      Impression and Recommendations:     This case required medical decision making of moderate complexity.      Note: This dictation was prepared with Dragon dictation along with smaller phrase technology. Any transcriptional errors that result from this process are unintentional.

## 2017-05-09 NOTE — Assessment & Plan Note (Signed)
Decision today to treat with OMT was based on Physical Exam  After verbal consent patient was treated with HVLA, ME, FPR techniques in cervical, thoracic, lumbar and sacral areas  Patient tolerated the procedure well with improvement in symptoms  Patient given exercises, stretches and lifestyle modifications  See medications in patient instructions if given  Patient will follow up in 4-6 weeks 

## 2017-05-09 NOTE — Assessment & Plan Note (Signed)
Sacroiliac Joint Mobilization and Rehab 1. Work on pretzel stretching, shoulder back and leg draped in front. 3-5 sets, 30 sec.. 2. hip abductor rotations. standing, hip flexion and rotation outward then inward. 3 sets, 15 reps. when can do comfortably, add ankle weights starting at 2 pounds.  3. cross over stretching - shoulder back to ground, same side leg crossover. 3-5 sets for 30 min..  4. rolling up and back knees to chest and rocking. 5. sacral tilt - 5 sets, hold for 5-10 seconds Discussed returning to activity  Patient is going on vacation. Discussed with patient at great length. Patient will start increasing activity slowly when she returns. Follow up again in 4 weeks

## 2017-05-09 NOTE — Patient Instructions (Signed)
See me again in 4 weeks Get back to the routine when you return

## 2017-05-09 NOTE — Assessment & Plan Note (Signed)
Stable. We'll continue to monitor. 

## 2017-05-30 MED FILL — tiZANidine HCL 4 MG TABS: 4 | 90 days supply | Qty: 90 | Fill #2

## 2017-06-04 ENCOUNTER — Encounter: Payer: Self-pay | Admitting: Family Medicine

## 2017-06-07 ENCOUNTER — Ambulatory Visit (INDEPENDENT_AMBULATORY_CARE_PROVIDER_SITE_OTHER): Payer: 59 | Admitting: Family Medicine

## 2017-06-07 ENCOUNTER — Encounter: Payer: Self-pay | Admitting: Family Medicine

## 2017-06-07 VITALS — BP 128/82 | HR 67 | Ht 69.5 in

## 2017-06-07 DIAGNOSIS — M999 Biomechanical lesion, unspecified: Secondary | ICD-10-CM

## 2017-06-07 DIAGNOSIS — M5416 Radiculopathy, lumbar region: Secondary | ICD-10-CM | POA: Diagnosis not present

## 2017-06-07 NOTE — Patient Instructions (Addendum)
Great to see you as always  Happy belated birthday! Thanks for the poop info Have a great labor day  Good to see you  See me again in 3-4 weeks.  Continue everything else Enjoy tennis

## 2017-06-07 NOTE — [facility] Notes (Signed)
[name] [address], [place] [address] Phone: [phone] Subjective:    CC: right hip pain   [id number]  Shelby [name] is a 41 y.o. female coming in with complaint of right hip pain, lateral aspect, different then her back.  Patient did respond well to an greater trochanter bursitis. Patient was making some response previously. Patient was to start increasing activity. Patient states still having some tightness more on the lateral aspect of the hip. Does feel and is coming from the back. Sometimes feels like there is a heaviness.      Past Medical History:  Diagnosis Date  . Anxiety   . GERD (gastroesophageal reflux disease)    Past Surgical History:  Procedure Laterality Date  . Cyst remove  12/2009  . INTRAUTERINE DEVICE INSERTION  12/2009   Social History   Social History  . Marital status: Married    Spouse name: N/A  . Number of children: N/A  . Years of education: N/A   Social History Main Topics  . Smoking status: Never Smoker  . 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 Alcohol use Yes  . Drug use: No  . Sexual activity: Not Asked   Other Topics Concern  . None   Social History Narrative  . None   No Known Allergies Family History  Problem Relation Age of Onset  . Hyperlipidemia Mother   . Hypertension Mother   . Depression Mother   . Hypertension Father   . Bipolar disorder Sister        x 2    Past medical history, social, surgical and family history all reviewed in electronic medical record.  No pertanent information unless stated regarding to the chief complaint.   Review of Systems: No headache, visual changes, nausea, vomiting, diarrhea, constipation, dizziness, abdominal pain, skin rash, fevers, chills, night sweats, weight loss, swollen lymph nodes, body aches, joint swelling, muscle aches, chest pain,  shortness of breath, mood changes.    Objective  Blood pressure 128/82, pulse 67, height 5' 9.5" (1.765 m), SpO2 95 %.   Systems examined below as of 06/07/17 General: NAD A&O x3 mood, affect normal  HEENT: Pupils equal, extraocular movements intact no nystagmus Respiratory: not short of breath at rest or with speaking Cardiovascular: No lower extremity edema, non tender Skin: Warm dry intact with no signs of infection or rash on extremities or on axial skeleton. Abdomen: Soft nontender, no masses Neuro: Cranial nerves  intact, neurovascularly intact in all extremities with 2+ DTRs and 2+ pulses. Lymph: No lymphadenopathy appreciated today  Gait normal with good balance and coordination.  MSK: Non tender with full range of motion and good stability and symmetric strength and tone of shoulders, elbows, wrist,  knee hips and ankles bilaterally.   Back Exam:  Inspection: Unremarkable  Motion: Flexion 40 deg, Extension 25 deg, Side Bending to 35 deg bilaterally,  Rotation to 35 deg bilaterally  SLR laying: Negative  XSLR laying: Negative  Palpable tenderness: Tender to palpation of the paraspinal musculature lumbar spine right greater than left. FABER: negative. Sensory change: Gross sensation intact to all lumbar and sacral dermatomes.  Reflexes: 2+ at both patellar tendons, 2+ at achilles tendons, Babinski's downgoing.  Strength at foot  4 out of 5 hip abduction compared to contralateral sign. 4+ dorsiflexion compared to the contralateral  side on the right side   Osteopathic findings C4 flexed rotated and side bent left C6 flexed rotated and side bent left T3 extended rotated and side bent right inhaled third rib T9 extended rotated and side bent left L2 flexed rotated and side bent right L4 flexed rotated and side bent left Sacrum right on right       Impression and Recommendations:     This case required medical decision making of moderate complexity.      Note:  This dictation was prepared with Dragon dictation along with smaller phrase technology. Any transcriptional errors that result from this process are unintentional.

## 2017-06-07 NOTE — Assessment & Plan Note (Signed)
Decision today to treat with OMT was based on Physical Exam  After verbal consent patient was treated with HVLA, ME, FPR techniques in cervical, thoracic, lumbar and sacral areas  Patient tolerated the procedure well with improvement in symptoms  Patient given exercises, stretches and lifestyle modifications  See medications in patient instructions if given  Patient will follow up in 3-4 weeks  

## 2017-06-07 NOTE — Assessment & Plan Note (Signed)
Still believe a significant amount of being discomfort and pain is secondary to the radicular symptoms. Has always responded well to the epidurals if needed. We discussed the possibility of other type of treatment such as radio frequency ablation. Patient wants to hold on this at this time. We discussed icing regimen. Patient will start to increase activity. Follow-up again with me in 3-4 weeks.

## 2017-06-27 MED FILL — VENLAFAXINE HCL ER 75 MG CA: 75 | 90 days supply | Qty: 90 | Fill #1

## 2017-06-27 NOTE — [facility] Notes (Signed)
[name] [address] [place],  [address] Phone: [phone] Subjective:    CC: right hip pain f/u  [id number]  Shelby [name] is a 41 y.o. female coming in with complaint of right hip pain, lateral aspect, different then her back.  Patient did respond well to an greater trochanter bursitis. Patient is also had a lumbar radiculopathy and does perform. Patient has responded somewhat to osteopathic manipulation. Patient has had difficulty with increasing activity. Patient states her hip is doing better. Patient states that she seems to be doing better overall. Has played tennis and even gone to yoga twice. Feels muscle soreness but not as severe. She was having previously.      Past Medical History:  Diagnosis Date  . Anxiety   . GERD (gastroesophageal reflux disease)    Past Surgical History:  Procedure Laterality Date  . Cyst remove  12/2009  . INTRAUTERINE DEVICE INSERTION  12/2009   Social History   Social History  . Marital status: Married    Spouse name: N/A  . Number of children: N/A  . Years of education: N/A   Social History Main Topics  . Smoking status: Never Smoker  . 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 Alcohol use Yes  . Drug use: No  . Sexual activity: Not Asked   Other Topics Concern  . None   Social History Narrative  . None   No Known Allergies Family History  Problem Relation Age of Onset  . Hyperlipidemia Mother   . Hypertension Mother   . Depression Mother   . Hypertension Father   . Bipolar disorder Sister        x 2    Past medical history, social, surgical and family history all reviewed in electronic medical record.  No pertanent information unless stated regarding to the chief complaint.   Review of Systems: No headache, visual changes, nausea, vomiting, diarrhea, constipation, dizziness,  abdominal pain, skin rash, fevers, chills, night sweats, weight loss, swollen lymph nodes, body aches, joint swelling, muscle aches, chest pain, shortness of breath, mood changes.     Objective  Blood pressure 130/80, pulse 68, height 5\' 9"  (1.753 m), weight 183 lb (83 kg), SpO2 97 %.   Systems examined below as of 06/28/17 General: NAD A&O x3 mood, affect normal  HEENT: Pupils equal, extraocular movements intact no nystagmus Respiratory: not short of breath at rest or with speaking Cardiovascular: No lower extremity edema, non tender Skin: Warm dry intact with no signs of infection or rash on extremities or on axial skeleton. Abdomen: Soft nontender, no masses Neuro: Cranial nerves  intact, neurovascularly intact in all extremities with 2+ DTRs and 2+ pulses. Lymph: No lymphadenopathy appreciated today  Gait normal with good balance and coordination.  MSK: Non tender with full range of motion and good stability and symmetric strength and tone of shoulders, elbows, wrist,  knee hips and ankles bilaterally.   Back Exam:  Inspection: Unremarkable  Motion: Flexion 40 deg, Extension 25 deg, Side Bending to 35 deg bilaterally,  Rotation to 45 deg bilaterally  SLR laying: Negative  XSLR laying: Negative  Palpable tenderness: Tender to palpation in the paraspinal musculature more in the lumbosacral area on the right side. FABER: Positive right. Sensory change: Gross sensation intact to all lumbar and sacral dermatomes.  Reflexes: 2+ at  both patellar tendons, 2+ at achilles tendons, Babinski's downgoing.  Strength at foot  Plantar-flexion: 5/5 Dorsi-flexion: 5/5 Eversion: 5/5 Inversion: 5/5  Leg strength  Quad: 5/5 Hamstring: 5/5 Hip flexor: 5/5 Hip abductors: 5/5  Gait unremarkable.  Osteopathic findings C2 flexed rotated and side bent right T3 extended rotated and side bent right inhaled third rib T9 extended rotated and side bent left L2 flexed rotated and side bent right Sacrum  right on right Hamstrings tight right side with a pelvic shear     Impression and Recommendations:     This case required medical decision making of moderate complexity.      Note: This dictation was prepared with Dragon dictation along with smaller phrase technology. Any transcriptional errors that result from this process are unintentional.

## 2017-06-28 ENCOUNTER — Encounter: Payer: Self-pay | Admitting: Family Medicine

## 2017-06-28 ENCOUNTER — Ambulatory Visit (INDEPENDENT_AMBULATORY_CARE_PROVIDER_SITE_OTHER): Payer: 59 | Admitting: Family Medicine

## 2017-06-28 VITALS — BP 130/80 | HR 68 | Ht 69.0 in | Wt 183.0 lb

## 2017-06-28 DIAGNOSIS — M999 Biomechanical lesion, unspecified: Secondary | ICD-10-CM

## 2017-06-28 DIAGNOSIS — M533 Sacrococcygeal disorders, not elsewhere classified: Secondary | ICD-10-CM | POA: Diagnosis not present

## 2017-06-28 NOTE — Patient Instructions (Addendum)
Good to see you  Ice is your friend See me again in 3 weeks

## 2017-06-28 NOTE — Assessment & Plan Note (Signed)
More stable. No radicular symptoms. Has different medications for breakthrough. Not change any medications at this time. Encourage icing regimen, home exercises and potentially increasing activity as tolerated. Follow-up again in 3-4 weeks

## 2017-06-28 NOTE — Assessment & Plan Note (Signed)
Decision today to treat with OMT was based on Physical Exam  After verbal consent patient was treated with HVLA, ME, FPR techniques in cervical, thoracic, lumbar and sacral areas  Patient tolerated the procedure well with improvement in symptoms  Patient given exercises, stretches and lifestyle modifications  See medications in patient instructions if given  Patient will follow up in 3-4 weeks  

## 2017-07-19 ENCOUNTER — Ambulatory Visit (INDEPENDENT_AMBULATORY_CARE_PROVIDER_SITE_OTHER): Payer: 59 | Admitting: Family Medicine

## 2017-07-19 ENCOUNTER — Encounter: Payer: Self-pay | Admitting: Family Medicine

## 2017-07-19 VITALS — BP 114/80 | HR 85 | Ht 69.0 in | Wt 183.0 lb

## 2017-07-19 DIAGNOSIS — M999 Biomechanical lesion, unspecified: Secondary | ICD-10-CM | POA: Diagnosis not present

## 2017-07-19 DIAGNOSIS — M533 Sacrococcygeal disorders, not elsewhere classified: Secondary | ICD-10-CM | POA: Diagnosis not present

## 2017-07-19 NOTE — Assessment & Plan Note (Signed)
Decision today to treat with OMT was based on Physical Exam  After verbal consent patient was treated with HVLA, ME, FPR techniques in cervical, thoracic, lumbar and sacral areas  Patient tolerated the procedure well with improvement in symptoms  Patient given exercises, stretches and lifestyle modifications  See medications in patient instructions if given  Patient will follow up in 3-6 weeks 

## 2017-07-19 NOTE — Patient Instructions (Signed)
Good to see you  You are awesome Good luck with the puppy  See me again in 3-6 weeks

## 2017-07-19 NOTE — [facility] Notes (Signed)
[name] [place],  [address] Phone: [phone] Subjective:     CC: low back pain    [id number]  Shelby [name] is a 41 y.o. female coming in with complaint of  Low back pain. Seen patient multiple times. Has been doing well. No radiation of the pain, some tightness  patient has been able to start increasing activity. Has numbness things it used to exacerbated like such as riding in a car for long amount of time seems to be better.      Past Medical History:  Diagnosis Date  . Anxiety   . GERD (gastroesophageal reflux disease)    Past Surgical History:  Procedure Laterality Date  . Cyst remove  12/2009  . INTRAUTERINE DEVICE INSERTION  12/2009   Social History   Social History  . Marital status: Married    Spouse name: N/A  . Number of children: N/A  . Years of education: N/A   Social History Main Topics  . Smoking status: Never Smoker  . 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 Alcohol use Yes  . Drug use: No  . Sexual activity: Not Asked   Other Topics Concern  . None   Social History Narrative  . None   No Known Allergies Family History  Problem Relation Age of Onset  . Hyperlipidemia Mother   . Hypertension Mother   . Depression Mother   . Hypertension Father   . Bipolar disorder Sister        x 2     Past medical history, social, surgical and family history all reviewed in electronic medical record.  No pertanent information unless stated regarding to the chief complaint.   Review of Systems:Review of systems updated and as accurate as of 07/19/17  No headache, visual changes, nausea, vomiting, diarrhea, constipation, dizziness, abdominal pain, skin rash, fevers, chills, night sweats, weight loss, swollen lymph nodes, body aches, joint swelling, chest pain, shortness of breath, mood changes. Positive  muscle aches  Objective  Blood pressure 114/80, pulse 85, height 5\' 9"  (1.753 m), weight 183 lb (83 kg), SpO2 98 %. Systems examined below as of 07/19/17   General: No apparent distress alert and oriented x3 mood and affect normal, dressed appropriately.  HEENT: Pupils equal, extraocular movements intact  Respiratory: Patient's speak in full sentences and does not appear short of breath  Cardiovascular: No lower extremity edema, non tender, no erythema  Skin: Warm dry intact with no signs of infection or rash on extremities or on axial skeleton.  Abdomen: Soft nontender  Neuro: Cranial nerves II through XII are intact, neurovascularly intact in all extremities with 2+ DTRs and 2+ pulses.  Lymph: No lymphadenopathy of posterior or anterior cervical chain or axillae bilaterally.  Gait normal with good balance and coordination.  MSK:  Non tender with full range of motion and good stability and symmetric strength and tone of shoulders, elbows, wrist, hip, knee and ankles bilaterally.  Back Exam:  Inspection: Unremarkable  Motion: Flexion 40 deg, Extension 25 deg, Side Bending to 35 deg bilaterally,  Rotation to 35 deg bilaterally  SLR laying: Negative  XSLR laying: Negative  Palpable tenderness: Moderate tenderness to palpation in the paraspinal musculature FABER: Positive right Sensory change: Gross sensation intact to all lumbar and sacral dermatomes.  Reflexes: 2+ at both patellar tendons,  2+ at achilles tendons, Babinski's downgoing.  Strength at foot  Plantar-flexion: 5/5 Dorsi-flexion: 5/5 Eversion: 5/5 Inversion: 5/5  Leg strength  Quad: 5/5 Hamstring: 5/5 Hip flexor: 5/5 Hip abductors: 5/5  Gait unremarkable.  Osteopathic findings C2 flexed rotated and side bent right C4 flexed rotated and side bent left C6 flexed rotated and side bent left T3 extended rotated and side bent right inhaled third rib T6 extended rotated and side bent left L2 flexed rotated and side bent  right Sacrum right on right     Impression and Recommendations:     This case required medical decision making of moderate complexity.      Note: This dictation was prepared with Dragon dictation along with smaller phrase technology. Any transcriptional errors that result from this process are unintentional.

## 2017-07-19 NOTE — Assessment & Plan Note (Signed)
Improvement. No radicular symptoms. Encourage patient to start increasing activity as tolerated. We discussed icing regimen and home exercises. Discussed objective is to do a which was to avoid. Patient will come back and see me again in 3-6 weeks

## 2017-07-24 ENCOUNTER — Encounter: Payer: Self-pay | Admitting: Family Medicine

## 2017-07-24 ENCOUNTER — Other Ambulatory Visit: Payer: Self-pay

## 2017-07-24 DIAGNOSIS — M5416 Radiculopathy, lumbar region: Secondary | ICD-10-CM

## 2017-07-29 ENCOUNTER — Ambulatory Visit
Admission: RE | Admit: 2017-07-29 | Discharge: 2017-07-29 | Disposition: A | Discharge status: Home / Self Care | Payer: 59 | Source: Ambulatory Visit | Attending: Family Medicine | Admitting: Family Medicine

## 2017-07-29 DIAGNOSIS — M47817 Spondylosis without myelopathy or radiculopathy, lumbosacral region: Secondary | ICD-10-CM | POA: Diagnosis not present

## 2017-07-29 DIAGNOSIS — M5416 Radiculopathy, lumbar region: Secondary | ICD-10-CM

## 2017-07-29 MED ORDER — IOPAMIDOL (ISOVUE-M 200) INJECTION 41%: 1.0000 mL | Freq: Once | INTRAMUSCULAR | Status: DC

## 2017-07-29 MED ORDER — METHYLPREDNISOLONE ACETATE 40 MG/ML INJ SUSP (RADIOLOG: 120.0000 mg | Freq: Once | INTRAMUSCULAR | Status: DC

## 2017-08-12 ENCOUNTER — Ambulatory Visit: Payer: 59 | Admitting: Family Medicine

## 2017-08-12 ENCOUNTER — Encounter: Payer: Self-pay | Admitting: Family Medicine

## 2017-08-12 VITALS — BP 112/82 | HR 85 | Wt 188.0 lb

## 2017-08-12 DIAGNOSIS — M533 Sacrococcygeal disorders, not elsewhere classified: Secondary | ICD-10-CM | POA: Diagnosis not present

## 2017-08-12 DIAGNOSIS — M999 Biomechanical lesion, unspecified: Secondary | ICD-10-CM

## 2017-08-12 NOTE — Assessment & Plan Note (Signed)
Decision today to treat with OMT was based on Physical Exam  After verbal consent patient was treated with HVLA, ME, FPR techniques in cervical, thoracic, lumbar and sacral areas  Patient tolerated the procedure well with improvement in symptoms  Patient given exercises, stretches and lifestyle modifications  See medications in patient instructions if given  Patient will follow up in 4 weeks 

## 2017-08-12 NOTE — [facility] Notes (Signed)
[name] [place], [place] [address] Phone: [phone] Subjective:    I'm seeing this patient by the request  of:    CC: Back pain follow-up  [id number]  Shelby [name] is a 41 y.o. female coming in with complaint of back pain.  Patient has had lumbar radiculopathy for quite some time. In symptoms and repeat another epidural on July 29, 2017.  Patient has had 3 of these since December 2017.  Patient states that her back is feeling good from the epidural.  Patient is here for more of the manipulation.  He is on medications including the Effexor.     Past Medical History:  Diagnosis Date  . Anxiety   . GERD (gastroesophageal reflux disease)    Past Surgical History:  Procedure Laterality Date  . Cyst remove  12/2009  . INTRAUTERINE DEVICE INSERTION  12/2009   Social History   Socioeconomic History  . Marital status: Married    Spouse name: Not on file  . Number of children: Not on file  . Years of education: Not on file  . Highest education level: Not on file  Social Needs  . Financial resource strain: Not on file  . Food insecurity - worry: Not on file  . Food insecurity - inability: Not on file  . Transportation needs - medical: Not on file  . Transportation needs - non-medical: Not on file  Occupational History  . Not on file  Tobacco Use  . Smoking status: Never Smoker  . Smokeless tobacco: Never Used  . 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 Alcohol use: Yes  . Drug use: No  . Sexual activity: Not on file  Other Topics Concern  . Not on file  Social History Narrative  . Not on file   No Known Allergies Family History  Problem Relation Age of Onset  . Hyperlipidemia Mother   . Hypertension Mother   . Depression Mother   . Hypertension Father   . Bipolar disorder Sister        x 2     Past medical  history, social, surgical and family history all reviewed in electronic medical record.  No pertanent information unless stated regarding to the chief complaint.   Review of Systems:Review of systems updated and as accurate as of 08/12/17  No headache, visual changes, nausea, vomiting, diarrhea, constipation, dizziness, abdominal pain, skin rash, fevers, chills, night sweats, weight loss, swollen lymph nodes, body aches, joint swelling,chest pain, shortness of breath, mood changes.  Positive muscle aches  Objective  There were no vitals taken for this visit. Systems examined below as of 08/12/17   General: No apparent distress alert and oriented x3 mood and affect normal, dressed appropriately.  HEENT: Pupils equal, extraocular movements intact  Respiratory: Patient's speak in full sentences and does not appear short of breath  Cardiovascular: No lower extremity edema, non tender, no erythema  Skin: Warm dry intact with no signs of infection or rash on extremities or on axial skeleton.  Abdomen: Soft nontender  Neuro: Cranial nerves II through XII are intact, neurovascularly intact in all extremities with 2+ DTRs and 2+ pulses.  Lymph: No lymphadenopathy of posterior or anterior cervical chain or axillae bilaterally.  Gait normal with good balance and coordination.  MSK:  Non tender with full range of motion and good  stability and symmetric strength and tone of shoulders, elbows, wrist, hip, knee and ankles bilaterally.  Back Exam:  Inspection: Unremarkable  Motion: Flexion 25 deg, Extension 25 deg, Side Bending to 35 deg bilaterally,  Rotation to 35 deg bilaterally  SLR laying: Negative  XSLR laying: Negative  Palpable tenderness: Tender to palpation still over the paraspinal muscular. FABER: negative. Sensory change: Gross sensation intact to all lumbar and sacral dermatomes.  Reflexes: 2+ at both patellar tendons, 2+ at achilles tendons, Babinski's downgoing.  Strength at foot    Plantar-flexion: 5/5 Dorsi-flexion: 5/5 Eversion: 5/5 Inversion: 5/5  Leg strength  Quad: 5/5 Hamstring: 5/5 Hip flexor: 5/5 Hip abductors: 5/5  Gait unremarkable.  Osteopathic findings C2 flexed rotated and side bent right C7 flexed rotated and side bent left T3 extended rotated and side bent right inhaled third rib T9 extended rotated and side bent left L3 flexed rotated and side bent right Sacrum right on right    Impression and Recommendations:     This case required medical decision making of moderate complexity.      Note: This dictation was prepared with Dragon dictation along with smaller phrase technology. Any transcriptional errors that result from this process are unintentional.

## 2017-08-12 NOTE — Patient Instructions (Signed)
Good to see you  Good luck  See me again in 3-4 weeks

## 2017-08-12 NOTE — Assessment & Plan Note (Signed)
Patient does have more of the time rotation of the sacroiliac joint.  Patient is going to start changing some different home exercises.  We discussed icing regimen.  Patient will continue to work on core strength and we discussed more about isometric exercises.  Patient will come back and see me again in 4-6 weeks

## 2017-09-02 DIAGNOSIS — Z6827 Body mass index (BMI) 27.0-27.9, adult: Secondary | ICD-10-CM | POA: Diagnosis not present

## 2017-09-02 DIAGNOSIS — Z1212 Encounter for screening for malignant neoplasm of rectum: Secondary | ICD-10-CM | POA: Diagnosis not present

## 2017-09-02 DIAGNOSIS — Z01419 Encounter for gynecological examination (general) (routine) without abnormal findings: Secondary | ICD-10-CM | POA: Diagnosis not present

## 2017-09-02 NOTE — [facility] Notes (Signed)
[name] [place], [place] [address] Phone: [phone] Subjective:    I'm seeing this patient by the request  of:    CC: Low back pain.  [id number]  Shelby [name] is a 40 y.o. female coming in with complaint of low back pain.  Has been seen multiple times and has responded fairly well to osteopathic relation.  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  Has had 2 epidurals over the course of time.  Continuing on the Effexor at the same dose.  States that she is having more soreness in the sides of the hips.     Past Medical History:  Diagnosis Date  . Anxiety   . GERD (gastroesophageal reflux disease)    Past Surgical History:  Procedure Laterality Date  . Cyst remove  12/2009  . INTRAUTERINE DEVICE INSERTION  12/2009   Social History   Socioeconomic History  . Marital status: Married    Spouse name: Not on file  . Number of children: Not on file  . Years of education: Not on file  . Highest education level: Not on file  Social Needs  . Financial resource strain: Not on file  . Food insecurity - worry: Not on file  . Food insecurity - inability: Not on file  . Transportation needs - medical: Not on file  . Transportation needs - non-medical: Not on file  Occupational History  . Not on file  Tobacco Use  . Smoking status: Never Smoker  . Smokeless tobacco: Never Used  . 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 Alcohol use: Yes  . Drug use: No  . Sexual activity: Not on file  Other Topics Concern  . Not on file  Social History Narrative  . Not on file   No Known Allergies Family History  Problem Relation Age of Onset  . Hyperlipidemia Mother   . Hypertension Mother   . Depression Mother   . Hypertension Father   . Bipolar disorder Sister        x 2     Past medical history,  social, surgical and family history all reviewed in electronic medical record.  No pertanent information unless stated regarding to the chief complaint.   Review of Systems:Review of systems updated and as accurate as of 09/02/17  No headache, visual changes, nausea, vomiting, diarrhea, constipation, dizziness, abdominal pain, skin rash, fevers, chills, night sweats, weight loss, swollen lymph nodes, body aches, joint swelling,chest pain, shortness of breath, mood changes.  Positive muscle aches  Objective  There were no vitals taken for this visit. Systems examined below as of 09/02/17   General: No apparent distress alert and oriented x3 mood and affect normal, dressed appropriately.  HEENT: Pupils equal, extraocular movements intact  Respiratory: Patient's speak in full sentences and does not appear short of breath  Cardiovascular: No lower extremity edema, non tender, no erythema  Skin: Warm dry intact with no signs of infection or rash on extremities or on axial skeleton.  Abdomen: Soft nontender  Neuro: Cranial nerves II through XII are intact, neurovascularly intact in all extremities with 2+ DTRs and 2+ pulses.  Lymph: No lymphadenopathy of posterior or anterior cervical chain or axillae bilaterally.  Gait normal with good balance and coordination.  MSK:  Non tender with full range of motion  and good stability and symmetric strength and tone of shoulders, elbows, wrist, hip, knee and ankles bilaterally.  Back Exam:  Inspection: Unremarkable  Motion: Flexion 45 deg, Extension 25 deg, Side Bending to 45 deg bilaterally,  Rotation to 45 deg bilaterally  SLR laying: Negative  XSLR laying: Negative  Palpable tenderness: Tender palpation of the paraspinal musculature lumbar spine bilaterally.  More pain over the piriformis bilaterally.Marland Kitchen FABER: Positive bilateral. Sensory change: Gross sensation intact to all lumbar and sacral dermatomes.  Reflexes: 2+ at both patellar tendons, 2+ at  achilles tendons, Babinski's downgoing.  Strength at foot  Plantar-flexion: 5/5 Dorsi-flexion: 5/5 Eversion: 5/5 Inversion: 5/5  Leg strength  Quad: 5/5 Hamstring: 5/5 Hip flexor: 5/5 Hip abductors: 5/5  Gait unremarkable.  Osteopathic findings C2 flexed rotated and side bent right C7 flexed rotated and side bent left T3 extended rotated and side bent right inhaled third rib T8 extended rotated and side bent left L2 flexed rotated and side bent right  L4 flexion rotated side bend left Sacrum right on right     Impression and Recommendations:     This case required medical decision making of moderate complexity.      Note: This dictation was prepared with Dragon dictation along with smaller phrase technology. Any transcriptional errors that result from this process are unintentional.

## 2017-09-03 ENCOUNTER — Encounter: Payer: Self-pay | Admitting: Family Medicine

## 2017-09-03 ENCOUNTER — Ambulatory Visit: Payer: 59 | Admitting: Family Medicine

## 2017-09-03 VITALS — BP 110/80 | HR 46 | Ht 69.0 in | Wt 186.0 lb

## 2017-09-03 DIAGNOSIS — M999 Biomechanical lesion, unspecified: Secondary | ICD-10-CM

## 2017-09-03 DIAGNOSIS — M5416 Radiculopathy, lumbar region: Secondary | ICD-10-CM

## 2017-09-03 MED ORDER — TRAMADOL HCL 50 MG PO TABS
50.0000 mg | ORAL_TABLET | Freq: Two times a day (BID) | ORAL | 0 refills | Status: DC | PRN
Start: 2017-09-03 — End: 2018-03-10

## 2017-09-03 MED ORDER — TIZANIDINE HCL 4 MG PO TABS: 4.0000 mg | ORAL_TABLET | Freq: Every evening | ORAL | 2 refills | Status: DC

## 2017-09-03 MED ORDER — VENLAFAXINE HCL 25 MG PO TABS: 25.0000 mg | ORAL_TABLET | Freq: Two times a day (BID) | ORAL | 1 refills | Status: DC

## 2017-09-03 MED FILL — traMADol HCL 50 MG TABS: 50 | 30 days supply | Qty: 60 | Fill #0

## 2017-09-03 MED FILL — VENLAFAXINE HCL 25 MG TAB: 25 | 30 days supply | Qty: 60 | Fill #0

## 2017-09-03 MED FILL — tiZANidine HCL 4 MG TABS: 4 | 90 days supply | Qty: 90 | Fill #0

## 2017-09-03 NOTE — Assessment & Plan Note (Signed)
Patient is not having any lumbar radiculopathy at this time.  We discussed the patient in great length.  Discussed icing regimen, home exercise, which activities to do which wants to avoid.  Patient is to increase activity slowly over the course of the next several days.  Patient has responded well to the osteopathic manipulation.  Follow-up again in 2-3 weeks.

## 2017-09-03 NOTE — Assessment & Plan Note (Signed)
Decision today to treat with OMT was based on Physical Exam  After verbal consent patient was treated with HVLA, ME, FPR techniques in cervical, thoracic, lumbar and sacral areas  Patient tolerated the procedure well with improvement in symptoms  Patient given exercises, stretches and lifestyle modifications  See medications in patient instructions if given  Patient will follow up in 2-3 weeks 

## 2017-09-03 NOTE — Patient Instructions (Signed)
Good to see you   See me again in 2 weeks 

## 2017-09-09 ENCOUNTER — Encounter: Payer: Self-pay | Admitting: Family Medicine

## 2017-09-10 MED ORDER — VENLAFAXINE HCL ER 75 MG PO CP24: 75.0000 mg | ORAL_CAPSULE | Freq: Every day | ORAL | 1 refills | Status: DC

## 2017-09-13 ENCOUNTER — Encounter: Payer: Self-pay | Admitting: Family Medicine

## 2017-09-16 MED FILL — VENLAFAXINE HCL ER 75 MG CA: 75 | 90 days supply | Qty: 90 | Fill #0

## 2017-09-17 NOTE — [facility] Notes (Signed)
[name] [place], [place] [address] Phone: [phone] Subjective:     CC: Back pain  [id number]  Shelby [name] is a 41 y.o. female coming in with complaint of back pain.  Patient has seen me multiple times.  Has had radicular symptoms that has responded well to epidural injections.  Patient has been doing relatively well at this moment.  Restarted yoga.  Feels like she is making improvement overall.  Denies any numbness down the leg but still has some discomfort in the hip itself.     Past Medical History:  Diagnosis Date  . Anxiety   . GERD (gastroesophageal reflux disease)    Past Surgical History:  Procedure Laterality Date  . Cyst remove  12/2009  . INTRAUTERINE DEVICE INSERTION  12/2009   Social History   Socioeconomic History  . Marital status: Married    Spouse name: None  . Number of children: None  . Years of education: None  . Highest education level: None  Social Needs  . Financial resource strain: None  . Food insecurity - worry: None  . Food insecurity - inability: None  . Transportation needs - medical: None  . Transportation needs - non-medical: None  Occupational History  . None  Tobacco Use  . Smoking status: Never Smoker  . Smokeless tobacco: Never Used  . 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 Alcohol use: Yes  . Drug use: No  . Sexual activity: None  Other Topics Concern  . None  Social History Narrative  . None   No Known Allergies Family History  Problem Relation Age of Onset  . Hyperlipidemia Mother   . Hypertension Mother   . Depression Mother   . Hypertension Father   . Bipolar disorder Sister        x 2     Past medical history, social, surgical and family history all reviewed in electronic medical record.  No pertanent information unless stated regarding to the chief  complaint.   Review of Systems:Review of systems updated and as accurate as of 09/18/17  No headache, visual changes, nausea, vomiting, diarrhea, constipation, dizziness, abdominal pain, skin rash, fevers, chills, night sweats, weight loss, swollen lymph nodes, body aches, joint swelling, , chest pain, shortness of breath, mood changes.  Positive muscle aches  Objective  Blood pressure 130/82, pulse 94, height 5\' 8"  (1.727 m), weight 188 lb (85.3 kg), SpO2 98 %. Systems examined below as of 09/18/17   General: No apparent distress alert and oriented x3 mood and affect normal, dressed appropriately.  HEENT: Pupils equal, extraocular movements intact  Respiratory: Patient's speak in full sentences and does not appear short of breath  Cardiovascular: No lower extremity edema, non tender, no erythema  Skin: Warm dry intact with no signs of infection or rash on extremities or on axial skeleton.  Abdomen: Soft nontender  Neuro: Cranial nerves II through XII are intact, neurovascularly intact in all extremities with 2+ DTRs and 2+ pulses.  Lymph: No lymphadenopathy of posterior or anterior cervical chain or axillae bilaterally.  Gait normal with good balance and coordination.  MSK:  Non tender with full range of motion and good stability and symmetric strength and tone of shoulders, elbows, wrist, hip, knee and ankles bilaterally.  Back Exam:  Inspection: Unremarkable  Motion: Flexion 45 deg, Extension 25 deg,  Side Bending to 45 deg bilaterally,  Rotation to 45 deg bilaterally  SLR laying: Negative  XSLR laying: Negative  Palpable tenderness: Tender to palpation more on the paraspinal musculature on the right side. FABER: Positive Faber right. Sensory change: Gross sensation intact to all lumbar and sacral dermatomes.  Reflexes: 2+ at both patellar tendons, 2+ at achilles tendons, Babinski's downgoing.  Strength at foot  Plantar-flexion: 5/5 Dorsi-flexion: 5/5 Eversion: 5/5 Inversion: 5/5    Leg strength  Quad: 5/5 Hamstring: 5/5 Hip flexor: 5/5 Hip abductors: 5/5  Gait unremarkable.  Osteopathic findings C6 flexed rotated and side bent left T3 extended rotated and side bent right inhaled third rib T9 extended rotated and side bent left L2 flexed rotated and side bent right Sacrum right on right    Impression and Recommendations:     This case required medical decision making of moderate complexity.      Note: This dictation was prepared with Dragon dictation along with smaller phrase technology. Any transcriptional errors that result from this process are unintentional.

## 2017-09-18 ENCOUNTER — Ambulatory Visit (INDEPENDENT_AMBULATORY_CARE_PROVIDER_SITE_OTHER): Payer: 59 | Admitting: Family Medicine

## 2017-09-18 ENCOUNTER — Encounter: Payer: Self-pay | Admitting: Family Medicine

## 2017-09-18 VITALS — BP 130/82 | HR 94 | Ht 68.0 in | Wt 188.0 lb

## 2017-09-18 DIAGNOSIS — M999 Biomechanical lesion, unspecified: Secondary | ICD-10-CM

## 2017-09-18 DIAGNOSIS — M533 Sacrococcygeal disorders, not elsewhere classified: Secondary | ICD-10-CM | POA: Diagnosis not present

## 2017-09-18 DIAGNOSIS — Z23 Encounter for immunization: Secondary | ICD-10-CM | POA: Diagnosis not present

## 2017-09-18 NOTE — Assessment & Plan Note (Signed)
Decision today to treat with OMT was based on Physical Exam  After verbal consent patient was treated with HVLA, ME, FPR techniques in cervical, thoracic, lumbar and sacral areas  Patient tolerated the procedure well with improvement in symptoms  Patient given exercises, stretches and lifestyle modifications  See medications in patient instructions if given  Patient will follow up in 4 weeks 

## 2017-09-18 NOTE — Patient Instructions (Signed)
Good to see you  Shelby [name] is your friend.  You are doing great  EAT AFTER YOGA  Enjoy your nap  See me again In 4 weeks

## 2017-09-18 NOTE — Assessment & Plan Note (Signed)
I believe the patient's pain is more secondary to the sacroiliac joint than truly any lumbar radiculopathy at this time.  We discussed icing regimen and home exercise.  We discussed which activities to do and which wants to avoid.  Increase activity slowly over the course of the next several days.

## 2017-09-23 ENCOUNTER — Other Ambulatory Visit: Payer: 59

## 2017-10-04 ENCOUNTER — Ambulatory Visit: Payer: 59

## 2017-10-04 ENCOUNTER — Ambulatory Visit
Admission: RE | Admit: 2017-10-04 | Discharge: 2017-10-04 | Disposition: A | Discharge status: Home / Self Care | Payer: 59 | Source: Ambulatory Visit | Attending: Obstetrics and Gynecology | Admitting: Obstetrics and Gynecology

## 2017-10-04 DIAGNOSIS — R922 Inconclusive mammogram: Secondary | ICD-10-CM | POA: Diagnosis not present

## 2017-10-04 DIAGNOSIS — N6489 Other specified disorders of breast: Secondary | ICD-10-CM

## 2017-10-15 NOTE — [facility] Notes (Signed)
[name] [place], [place] [address] Phone: [phone] Subjective:    I'm seeing this patient by the request  of:    CC: Low back pain follow-up  [id number]  Shelby [name] is a 42 y.o. female coming in with complaint of low back pain.  Patient has been seen previously.  Does have a nerve impingement noted on the right side.  Patient has been attempting to avoid any other type of injections if possible.  Patient has responded somewhat to epidurals, nerve root injections as well as greater trochanteric injections.  We have been doing osteopathic manipulation as well.  Patient continues to be active otherwise.  Patient states she does relatively well.  Anytime though she tries to T.  Since we have seen patient 4 weeks ago patient has had 2 3 days stints where she could not get out of bed.      Past Medical History:  Diagnosis Date  . Anxiety   . GERD (gastroesophageal reflux disease)    Past Surgical History:  Procedure Laterality Date  . Cyst remove  12/2009  . INTRAUTERINE DEVICE INSERTION  12/2009   Social History   Socioeconomic History  . Marital status: Married    Spouse name: Not on file  . Number of children: Not on file  . Years of education: Not on file  . Highest education level: Not on file  Social Needs  . Financial resource strain: Not on file  . Food insecurity - worry: Not on file  . Food insecurity - inability: Not on file  . Transportation needs - medical: Not on file  . Transportation needs - non-medical: Not on file  Occupational History  . Not on file  Tobacco Use  . Smoking status: Never Smoker  . Smokeless tobacco: Never Used  . 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 Alcohol use: Yes  . Drug use: No  . Sexual activity: Not on file  Other Topics Concern  . Not on file  Social History Narrative  .  Not on file   No Known Allergies Family History  Problem Relation Age of Onset  . Hyperlipidemia Mother   . Hypertension Mother   . Depression Mother   . Hypertension Father   . Bipolar disorder Sister        x 2     Past medical history, social, surgical and family history all reviewed in electronic medical record.  No pertanent information unless stated regarding to the chief complaint.   Review of Systems:Review of systems updated and as accurate as of 10/15/17  No headache, visual changes, nausea, vomiting, diarrhea, constipation, dizziness, abdominal pain, skin rash, fevers, chills, night sweats, weight loss, swollen lymph nodes, body aches, joint swelling, muscle aches, chest pain, shortness of breath, mood changes.   Objective  There were no vitals taken for this visit. Systems examined below as of 10/15/17   General: No apparent distress alert and oriented x3 mood and affect normal, dressed appropriately.  HEENT: Pupils equal, extraocular movements intact  Respiratory: Patient's speak in full sentences and does not appear short of breath  Cardiovascular: No lower extremity edema, non tender, no erythema  Skin: Warm dry intact with no signs of infection or rash on extremities or on axial skeleton.  Abdomen: Soft nontender  Neuro: Cranial nerves II through XII are  intact, neurovascularly intact in all extremities with 2+ DTRs and 2+ pulses.  Lymph: No lymphadenopathy of posterior or anterior cervical chain or axillae bilaterally.  Gait normal with good balance and coordination.  MSK:  Non tender with full range of motion and good stability and symmetric strength and tone of shoulders, elbows, wrist, hip, knee and ankles bilaterally.    Osteopathic findings C2 flexed rotated and side bent right C4 flexed rotated and side bent left C6 flexed rotated and side bent left T3 extended rotated and side bent right inhaled third rib T9 extended rotated and side bent left L2 flexed  rotated and side bent right Sacrum right on right    Impression and Recommendations:     This case required medical decision making of moderate complexity.      Note: This dictation was prepared with Dragon dictation along with smaller phrase technology. Any transcriptional errors that result from this process are unintentional.

## 2017-10-16 ENCOUNTER — Ambulatory Visit (INDEPENDENT_AMBULATORY_CARE_PROVIDER_SITE_OTHER): Payer: 59 | Admitting: Family Medicine

## 2017-10-16 ENCOUNTER — Encounter: Payer: Self-pay | Admitting: Family Medicine

## 2017-10-16 VITALS — BP 134/98 | HR 79

## 2017-10-16 DIAGNOSIS — M999 Biomechanical lesion, unspecified: Secondary | ICD-10-CM | POA: Diagnosis not present

## 2017-10-16 DIAGNOSIS — M5416 Radiculopathy, lumbar region: Secondary | ICD-10-CM

## 2017-10-16 NOTE — Assessment & Plan Note (Signed)
Decision today to treat with OMT was based on Physical Exam  After verbal consent patient was treated with hvla, me techniques in cervical, thoracic, rib , lumbar and sacral  areas  Patient tolerated the procedure well with improvement in symptoms  Patient given exercises, stretches and lifestyle modifications  See medications in patient instructions if given  Patient will follow up in 4 weeks

## 2017-10-16 NOTE — Assessment & Plan Note (Signed)
Known impingement, respond to injection, discussed icing, HEP May need further imaging of the hip  RTC in 4 weeks

## 2017-11-04 ENCOUNTER — Encounter: Payer: Self-pay | Admitting: Family Medicine

## 2017-11-08 ENCOUNTER — Encounter: Payer: Self-pay | Admitting: Family Medicine

## 2017-11-08 ENCOUNTER — Ambulatory Visit (INDEPENDENT_AMBULATORY_CARE_PROVIDER_SITE_OTHER): Payer: 59 | Admitting: Family Medicine

## 2017-11-08 VITALS — BP 112/84 | HR 66

## 2017-11-08 DIAGNOSIS — M533 Sacrococcygeal disorders, not elsewhere classified: Secondary | ICD-10-CM

## 2017-11-08 DIAGNOSIS — M999 Biomechanical lesion, unspecified: Secondary | ICD-10-CM | POA: Diagnosis not present

## 2017-11-08 NOTE — [facility] Notes (Signed)
[name] [place], [place] [address] Phone: [phone] Subjective:    I'm seeing this patient by the request  of:    CC: Low back pain follow-up  [id number]  Shelby [name] is a 42 y.o. female coming in with complaint of low back pain.  Patient was seen multiple times before.  Please see previous notes for more detailed description.  Patient has not had any radicular symptoms in quite some time.  Has started to increase activity.  Patient is doing yoga and tennis on a more regular basis.  Still some soreness and stiffness but no radicular symptoms.     Past Medical History:  Diagnosis Date  . Anxiety   . GERD (gastroesophageal reflux disease)    Past Surgical History:  Procedure Laterality Date  . Cyst remove  12/2009  . INTRAUTERINE DEVICE INSERTION  12/2009   Social History   Socioeconomic History  . Marital status: Married    Spouse name: Not on file  . Number of children: Not on file  . Years of education: Not on file  . Highest education level: Not on file  Social Needs  . Financial resource strain: Not on file  . Food insecurity - worry: Not on file  . Food insecurity - inability: Not on file  . Transportation needs - medical: Not on file  . Transportation needs - non-medical: Not on file  Occupational History  . Not on file  Tobacco Use  . Smoking status: Never Smoker  . Smokeless tobacco: Never Used  . 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 Alcohol use: Yes  . Drug use: No  . Sexual activity: Not on file  Other Topics Concern  . Not on file  Social History Narrative  . Not on file   No Known Allergies Family History  Problem Relation Age of Onset  . Hyperlipidemia Mother   . Hypertension Mother   . Depression Mother   . Hypertension Father   . Bipolar disorder Sister        x 2     Past medical  history, social, surgical and family history all reviewed in electronic medical record.  No pertanent information unless stated regarding to the chief complaint.   Review of Systems:Review of systems updated and as accurate as of 11/08/17  No headache, visual changes, nausea, vomiting, diarrhea, constipation, dizziness, abdominal pain, skin rash, fevers, chills, night sweats, weight loss, swollen lymph nodes, body aches, joint swelling, , chest pain, shortness of breath, mood changes.  Positive muscle aches  Objective  Blood pressure 112/84, pulse 66, SpO2 98 %.  Systems examined below as of 11/08/17   General: No apparent distress alert and oriented x3 mood and affect normal, dressed appropriately.  HEENT: Pupils equal, extraocular movements intact  Respiratory: Patient's speak in full sentences and does not appear short of breath  Cardiovascular: No lower extremity edema, non tender, no erythema  Skin: Warm dry intact with no signs of infection or rash on extremities or on axial skeleton.  Abdomen: Soft nontender  Neuro: Cranial nerves II through XII are intact, neurovascularly intact in all extremities with 2+ DTRs and 2+ pulses.  Lymph: No lymphadenopathy of posterior or anterior cervical chain or axillae bilaterally.  Gait normal with good balance and coordination.  MSK:  Non tender with full range of motion  and good stability and symmetric strength and tone of shoulders, elbows, wrist, hip, knee and ankles bilaterally.  Back Exam:  Inspection: Unremarkable  Motion: Flexion 40 deg, Extension 25 deg, Side Bending to 35 deg bilaterally,  Rotation to 45 deg bilaterally  SLR laying: Negative  XSLR laying: Negative  Palpable tenderness: Tender to palpation in the paraspinal musculature in the lumbar spine as well as diffusely.  More tenderness over the right gluteal area. FABER: negative. Sensory change: Gross sensation intact to all lumbar and sacral dermatomes.  Reflexes: 2+ at both  patellar tendons, 2+ at achilles tendons, Babinski's downgoing.  Strength at foot  Plantar-flexion: 5/5 Dorsi-flexion: 5/5 Eversion: 5/5 Inversion: 5/5  Leg strength  Quad: 5/5 Hamstring: 5/5 Hip flexor: 5/5 Hip abductors: 4/5 but symmetric Gait unremarkable.  Osteopathic findings C2 flexed rotated and side bent right C4 flexed rotated and side bent left C6 flexed rotated and side bent left T3 extended rotated and side bent right inhaled third rib T9 extended rotated and side bent left L2 flexed rotated and side bent right Sacrum right on right     Impression and Recommendations:     This case required medical decision making of moderate complexity.      Note: This dictation was prepared with Dragon dictation along with smaller phrase technology. Any transcriptional errors that result from this process are unintentional.

## 2017-11-09 NOTE — Assessment & Plan Note (Signed)
Decision today to treat with OMT was based on Physical Exam  After verbal consent patient was treated with HVLA, ME, FPR techniques in cervical, thoracic, lumbar and sacral areas  Patient tolerated the procedure well with improvement in symptoms  Patient given exercises, stretches and lifestyle modifications  See medications in patient instructions if given  Patient will follow up in 4-8 weeks 

## 2017-11-09 NOTE — Assessment & Plan Note (Signed)
Likely no radicular symptoms.  Discussed icing regimen and home exercises.  Discussed which activities to doing which wants to avoid.  Patient was to increase activity slowly over the course the next several days.  Patient will follow up with me again 4-8 weeks continue same medications

## 2017-11-13 ENCOUNTER — Encounter: Payer: Self-pay | Admitting: Family Medicine

## 2017-11-13 DIAGNOSIS — M5416 Radiculopathy, lumbar region: Secondary | ICD-10-CM

## 2017-11-14 ENCOUNTER — Encounter: Payer: Self-pay | Admitting: Family Medicine

## 2017-11-15 DIAGNOSIS — R509 Fever, unspecified: Secondary | ICD-10-CM | POA: Diagnosis not present

## 2017-11-18 ENCOUNTER — Ambulatory Visit
Admission: RE | Admit: 2017-11-18 | Discharge: 2017-11-18 | Disposition: A | Discharge status: Home / Self Care | Payer: 59 | Source: Ambulatory Visit | Attending: Family Medicine | Admitting: Family Medicine

## 2017-11-18 DIAGNOSIS — M4727 Other spondylosis with radiculopathy, lumbosacral region: Secondary | ICD-10-CM | POA: Diagnosis not present

## 2017-11-18 DIAGNOSIS — M5416 Radiculopathy, lumbar region: Secondary | ICD-10-CM

## 2017-11-18 MED ORDER — IOPAMIDOL (ISOVUE-M 200) INJECTION 41%
1.0000 mL | Freq: Once | INTRAMUSCULAR | Status: AC
  Administered 2017-11-18: 1 mL via EPIDURAL

## 2017-11-18 MED ORDER — METHYLPREDNISOLONE ACETATE 40 MG/ML INJ SUSP (RADIOLOG
120.0000 mg | Freq: Once | INTRAMUSCULAR | Status: AC
  Administered 2017-11-18: 120 mg via EPIDURAL

## 2017-11-18 NOTE — Discharge Instructions (Signed)

## 2017-11-19 ENCOUNTER — Encounter: Payer: Self-pay | Admitting: Family Medicine

## 2017-11-19 MED ORDER — GABAPENTIN 100 MG PO CAPS: 200.0000 mg | ORAL_CAPSULE | Freq: Every day | ORAL | 3 refills | Status: DC

## 2017-11-25 ENCOUNTER — Other Ambulatory Visit: Payer: 59

## 2017-11-27 MED FILL — GABAPENTIN 100 MG CAP: 100 | 30 days supply | Qty: 60 | Fill #0

## 2017-12-17 MED FILL — tiZANidine HCL 4 MG TABS: 4 | 90 days supply | Qty: 90 | Fill #1

## 2018-01-13 ENCOUNTER — Encounter: Payer: Self-pay | Admitting: Family Medicine

## 2018-01-13 MED FILL — VENLAFAXINE HCL ER 75 MG CA: 75 | 90 days supply | Qty: 90 | Fill #1

## 2018-01-15 NOTE — [facility] Notes (Signed)
[name] [place], [place] [address] Phone: [phone] Subjective:    CC: Back pain.  [id number]  Shelby [name] is a 42 y.o. female coming in with complaint of back pain. She had an epidural which did help her pain. She is playing tennis and is taking 3 days to recover. No pain with playing. Patient is frustrated due to the ongoing pain.  We have seen patient multiple times for this previously.  Had an epidural back in February that was helpful at the L4-L5 area.  Patient though states that the pain is worsened again as she stated above.  Patient does have an MRI from August 2017 showing a right L5 radiculitis with a paracentral disc herniation.      Past Medical History:  Diagnosis Date  . Anxiety   . GERD (gastroesophageal reflux disease)    Past Surgical History:  Procedure Laterality Date  . Cyst remove  12/2009  . INTRAUTERINE DEVICE INSERTION  12/2009   Social History   Socioeconomic History  . Marital status: Married    Spouse name: Not on file  . Number of children: Not on file  . Years of education: Not on file  . Highest education level: Not on file  Occupational History  . Not on file  Social Needs  . Financial resource strain: Not on file  . Food insecurity:    Worry: Not on file    Inability: Not on file  . Transportation needs:    Medical: Not on file    Non-medical: Not on file  Tobacco Use  . Smoking status: Never Smoker  . Smokeless tobacco: Never Used  . 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 Alcohol use: Yes  . Drug use: No  . Sexual activity: Not on file  Lifestyle  . Physical activity:    Days per week: Not on file    Minutes per session: Not on file  . Stress: Not on file  Relationships  . Social connections:    Talks on phone: Not on file    Gets together: Not on file    Attends  religious service: Not on file    Active member of club or organization: Not on file    Attends meetings of clubs or organizations: Not on file    Relationship status: Not on file  Other Topics Concern  . Not on file  Social History Narrative  . Not on file   No Known Allergies Family History  Problem Relation Age of Onset  . Hyperlipidemia Mother   . Hypertension Mother   . Depression Mother   . Hypertension Father   . Bipolar disorder Sister        x 2     Past medical history, social, surgical and family history all reviewed in electronic medical record.  No pertanent information unless stated regarding to the chief complaint.   Review of Systems:Review of systems updated and as accurate as of 01/16/18  No headache, visual changes, nausea, vomiting, diarrhea, constipation, dizziness, abdominal pain, skin rash, fevers, chills, night sweats, weight loss, swollen lymph nodes, body aches, joint swelling, chest pain, shortness of breath, mood changes.  Positive muscle aches  Objective  Blood pressure 122/84, pulse 63, height 5\' 8"  (1.727 m), weight 189 lb (85.7 kg), SpO2 98 %. Systems examined below as of  01/16/18   General: No apparent distress alert and oriented x3 mood and affect normal, dressed appropriately.  HEENT: Pupils equal, extraocular movements intact  Respiratory: Patient's speak in full sentences and does not appear short of breath  Cardiovascular: No lower extremity edema, non tender, no erythema  Skin: Warm dry intact with no signs of infection or rash on extremities or on axial skeleton.  Abdomen: Soft nontender  Neuro: Cranial nerves II through XII are intact, neurovascularly intact in all extremities with 2+ DTRs and 2+ pulses.  Lymph: No lymphadenopathy of posterior or anterior cervical chain or axillae bilaterally.  Gait normal with good balance and coordination.  MSK:  Non tender with full range of motion and good stability and symmetric strength and tone of  shoulders, elbows, wrist, hip, knee and ankles bilaterally.   Back Exam:  Inspection: Loss of lordosis Motion: Flexion 45 deg, Extension 25 deg, Side Bending to 30 deg bilaterally,  Rotation to 45 deg bilaterally  SLR laying: mild positive right  XSLR laying: Negative  Palpable tenderness: Tender to palpation the paraspinal musculature of of lumbar spine. Marland Kitchen FABER: negative. Sensory change: Gross sensation intact to all lumbar and sacral dermatomes.  Reflexes: 2+ at both patellar tendons, 2+ at achilles tendons, Babinski's downgoing.  Strength at foot  Plantar-flexion: 5/5 Dorsi-flexion: 5/5 Eversion: 5/5 Inversion: 5/5  Leg strength  Quad: 5/5 Hamstring: 5/5 Hip flexor: 5/5 Hip abductors: 4/5  Gait unremarkable.   Osteopathic findings C2 flexed rotated and side bent right C4 flexed rotated and side bent left C7 flexed rotated and side bent left T3 extended rotated and side bent right inhaled third rib T6 extended rotated and side bent left L4 flexed rotated and side bent right Sacrum right on right     Impression and Recommendations:     This case required medical decision making of moderate complexity.      Note: This dictation was prepared with Dragon dictation along with smaller phrase technology. Any transcriptional errors that result from this process are unintentional.

## 2018-01-16 ENCOUNTER — Ambulatory Visit (INDEPENDENT_AMBULATORY_CARE_PROVIDER_SITE_OTHER): Payer: 59 | Admitting: Family Medicine

## 2018-01-16 ENCOUNTER — Encounter: Payer: Self-pay | Admitting: Family Medicine

## 2018-01-16 VITALS — BP 122/84 | HR 63 | Ht 68.0 in | Wt 189.0 lb

## 2018-01-16 DIAGNOSIS — M999 Biomechanical lesion, unspecified: Secondary | ICD-10-CM | POA: Diagnosis not present

## 2018-01-16 DIAGNOSIS — M5416 Radiculopathy, lumbar region: Secondary | ICD-10-CM | POA: Diagnosis not present

## 2018-01-16 MED ORDER — POTASSIUM CHLORIDE CRYS ER 20 MEQ PO TBCR: 10.0000 meq | EXTENDED_RELEASE_TABLET | Freq: Every day | ORAL | 3 refills | Status: DC

## 2018-01-16 NOTE — Patient Instructions (Addendum)
Good to see you  Wrap wrist before activity  Ice is your friend Potassium on days with tennis try  1/2 pill first  1/2 cup gatorade with 1 cup water when doing any activity  See me again in 3-4 weeks

## 2018-01-16 NOTE — Assessment & Plan Note (Signed)
Decision today to treat with OMT was based on Physical Exam  After verbal consent patient was treated with HVLA, ME, FPR techniques in cervical, thoracic, lumbar and sacral areas  Patient tolerated the procedure well with improvement in symptoms  Patient given exercises, stretches and lifestyle modifications  See medications in patient instructions if given  Patient will follow up in 4 weeks 

## 2018-01-16 NOTE — Assessment & Plan Note (Addendum)
Worsening, right side.  Numbness  Patient is having radicular symptoms.  Failed all conservative therapy. Will send to nuerosurgery to discuss surgical intervention.  RTC in 4 weeks

## 2018-01-23 DIAGNOSIS — L814 Other melanin hyperpigmentation: Secondary | ICD-10-CM | POA: Diagnosis not present

## 2018-01-23 DIAGNOSIS — D225 Melanocytic nevi of trunk: Secondary | ICD-10-CM | POA: Diagnosis not present

## 2018-01-23 DIAGNOSIS — D224 Melanocytic nevi of scalp and neck: Secondary | ICD-10-CM | POA: Diagnosis not present

## 2018-01-23 DIAGNOSIS — D2262 Melanocytic nevi of left upper limb, including shoulder: Secondary | ICD-10-CM | POA: Diagnosis not present

## 2018-01-23 DIAGNOSIS — D1801 Hemangioma of skin and subcutaneous tissue: Secondary | ICD-10-CM | POA: Diagnosis not present

## 2018-01-23 DIAGNOSIS — Z419 Encounter for procedure for purposes other than remedying health state, unspecified: Secondary | ICD-10-CM | POA: Diagnosis not present

## 2018-01-23 DIAGNOSIS — D2261 Melanocytic nevi of right upper limb, including shoulder: Secondary | ICD-10-CM | POA: Diagnosis not present

## 2018-01-23 MED FILL — TRETINOIN 0.025% CREAM: 0.025 | 30 days supply | Qty: 45 | Fill #0

## 2018-01-23 MED FILL — HYDROQUINONE 4% CREAM: 4 | 30 days supply | Qty: 28 | Fill #0

## 2018-02-06 ENCOUNTER — Encounter: Payer: Self-pay | Admitting: Family Medicine

## 2018-02-06 ENCOUNTER — Ambulatory Visit (INDEPENDENT_AMBULATORY_CARE_PROVIDER_SITE_OTHER): Payer: 59 | Admitting: Family Medicine

## 2018-02-06 ENCOUNTER — Other Ambulatory Visit: Payer: Self-pay

## 2018-02-06 VITALS — BP 118/86 | HR 73 | Ht 68.0 in | Wt 185.0 lb

## 2018-02-06 DIAGNOSIS — M5416 Radiculopathy, lumbar region: Secondary | ICD-10-CM

## 2018-02-06 DIAGNOSIS — M999 Biomechanical lesion, unspecified: Secondary | ICD-10-CM | POA: Diagnosis not present

## 2018-02-06 NOTE — [facility] Notes (Signed)
[name] [place], [place] [address] Phone: [phone] Subjective:     CC: Back pain and neck pain follow-up  [id number]  Shelby [name] is a 42 y.o. female coming in with complaint of neck pain. She said that she developed a spasm in her neck yesterday and it went away but got worse last night. She has a pillow where you can take feathers out of it and adjust it but she feels that she is not adjusting it correctly. .  Continues to have chronic back pain.  Patient has failed all conservative therapy.  Last MRI was August 2017 that did show degenerative disc disease at L4-L5 and L5-S1 with a right-sided L5 nerve impingement.  Continues to have intermittent right-sided radicular symptoms.  Patient is failed physical therapy with 2 rounds, and has had 4 specific nerve root and epidural injections over the course of the years.  Patient's quality of life and activities of daily living is still having difficulty even though patient does not have any weakness.     Past Medical History:  Diagnosis Date  . Anxiety   . GERD (gastroesophageal reflux disease)    Past Surgical History:  Procedure Laterality Date  . Cyst remove  12/2009  . INTRAUTERINE DEVICE INSERTION  12/2009   Social History   Socioeconomic History  . Marital status: Married    Spouse name: Not on file  . Number of children: Not on file  . Years of education: Not on file  . Highest education level: Not on file  Occupational History  . Not on file  Social Needs  . Financial resource strain: Not on file  . Food insecurity:    Worry: Not on file    Inability: Not on file  . Transportation needs:    Medical: Not on file    Non-medical: Not on file  Tobacco Use  . Smoking status: Never Smoker  . Smokeless tobacco: Never Used  . 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  Alcohol use: Yes  . Drug use: No  . Sexual activity: Not on file  Lifestyle  . Physical activity:    Days per week: Not on file    Minutes per session: Not on file  . Stress: Not on file  Relationships  . Social connections:    Talks on phone: Not on file    Gets together: Not on file    Attends religious service: Not on file    Active member of club or organization: Not on file    Attends meetings of clubs or organizations: Not on file    Relationship status: Not on file  Other Topics Concern  . Not on file  Social History Narrative  . Not on file   No Known Allergies Family History  Problem Relation Age of Onset  . Hyperlipidemia Mother   . Hypertension Mother   . Depression Mother   . Hypertension Father   . Bipolar disorder Sister        x 2     Past medical history, social, surgical and family history all reviewed in electronic medical record.  No pertanent information unless stated regarding to the chief complaint.   Review of Systems:Review of systems updated and as accurate as of 02/06/18  No headache, visual changes, nausea, vomiting, diarrhea, constipation, dizziness, abdominal pain,  skin rash, fevers, chills, night sweats, weight loss, swollen lymph nodes, body aches, joint swelling, muscle aches, chest pain, shortness of breath, mood changes.   Objective  Blood pressure 118/86, pulse 73, height 5\' 8"  (1.727 m), weight 185 lb (83.9 kg), SpO2 98 %. Systems examined below as of 02/06/18   General: No apparent distress alert and oriented x3 mood and affect normal, dressed appropriately.  HEENT: Pupils equal, extraocular movements intact  Respiratory: Patient's speak in full sentences and does not appear short of breath  Cardiovascular: No lower extremity edema, non tender, no erythema  Skin: Warm dry intact with no signs of infection or rash on extremities or on axial skeleton.  Abdomen: Soft nontender  Neuro: Cranial nerves II through XII are intact,  neurovascularly intact in all extremities with 2+ DTRs and 2+ pulses.  Lymph: No lymphadenopathy of posterior or anterior cervical chain or axillae bilaterally.  Gait normal with good balance and coordination.  MSK:  Non tender with full range of motion and good stability and symmetric strength and tone of shoulders, elbows, wrist, hip, knee and ankles bilaterally.  Neck: Inspection loss of lordosis. No palpable stepoffs. Negative Spurling's maneuver. Mild limitation with range of motion.  Patient does have some mild decrease in extension Grip strength and sensation normal in bilateral hands Strength good C4 to T1 distribution No sensory change to C4 to T1 Negative Hoffman sign bilaterally Reflexes normal Right trapezius spasm   Back Exam:  Inspection: Loss of lordosis Motion: Flexion 40 deg, Extension 25 deg, Side Bending to 35 deg bilaterally,  Rotation to 40 deg bilaterally  SLR laying: Positive right XSLR laying: Negative  Palpable tenderness: Tender to palpation in the paraspinal musculature lumbar spine right greater than left. FABER: Positive right. Sensory change: Gross sensation intact to all lumbar and sacral dermatomes.  Reflexes: 2+ at both patellar tendons, 2+ at achilles tendons, Babinski's downgoing.  Strength at foot  Plantar-flexion: 5/5 Dorsi-flexion: 5/5 Eversion: 5/5 Inversion: 5/5  Leg strength  Quad: 5/5 Hamstring: 5/5 Hip flexor: 4/5 but symmetric hip abductors: 5/5  Gait unremarkable.  Osteopathic findings C2 flexed rotated and side bent right C4 flexed rotated and side bent left T3 extended rotated and side bent right inhaled third rib L2 flexed rotated and side bent right Sacrum right on right    Impression and Recommendations:     This case required medical decision making of moderate complexity.      Note: This dictation was prepared with Dragon dictation along with smaller phrase technology. Any transcriptional errors that result from this  process are unintentional.

## 2018-02-06 NOTE — Assessment & Plan Note (Signed)
Decision today to treat with OMT was based on Physical Exam  After verbal consent patient was treated with HVLA, ME, FPR techniques in cervical, thoracic, lumbar and sacral areas  Patient tolerated the procedure well with improvement in symptoms  Patient given exercises, stretches and lifestyle modifications  See medications in patient instructions if given  Patient will follow up in 4-8 weeks 

## 2018-02-06 NOTE — Assessment & Plan Note (Signed)
Continued radicular symptoms.  Patient would like referral to neurosurgery.  Will refer today for further evaluation and potential treatment including a possible microdiscectomy or even a laminectomy.  Patient is in agreement with this plan.  Has been responding somewhat to osteopathic manipulation but never is pain-free.

## 2018-02-06 NOTE — Patient Instructions (Signed)
Good to see you  Have a good weekend See me again in 3-4 weeks

## 2018-02-07 ENCOUNTER — Encounter: Payer: Self-pay | Admitting: Family Medicine

## 2018-02-20 DIAGNOSIS — G8929 Other chronic pain: Secondary | ICD-10-CM | POA: Diagnosis not present

## 2018-02-20 DIAGNOSIS — M545 Low back pain: Secondary | ICD-10-CM | POA: Diagnosis not present

## 2018-02-21 ENCOUNTER — Other Ambulatory Visit (HOSPITAL_COMMUNITY): Payer: Self-pay | Admitting: Neurosurgery

## 2018-02-21 DIAGNOSIS — M545 Low back pain, unspecified: Secondary | ICD-10-CM

## 2018-02-21 DIAGNOSIS — G8929 Other chronic pain: Secondary | ICD-10-CM

## 2018-02-27 NOTE — [facility] Notes (Signed)
[name] [place], [place] [address] Phone: [phone] Subjective:     CC: back pain follow-up  [id number]  Shelby [name] is a 42 y.o. female coming in with complaint of back pain. No new symptoms or changes since last visit.  Patient has had difficulty with the back for quite some time.  Has responded fairly well to epidural injections as well as manipulation.  Continues to have chronic pain.  Has been seeing a neurosurgeon for potential evaluation for surgical intervention but was recommended to have a 2 level fusion strength impressive.      Past Medical History:  Diagnosis Date  . Anxiety   . GERD (gastroesophageal reflux disease)    Past Surgical History:  Procedure Laterality Date  . Cyst remove  12/2009  . INTRAUTERINE DEVICE INSERTION  12/2009   Social History   Socioeconomic History  . Marital status: Married    Spouse name: Not on file  . Number of children: Not on file  . Years of education: Not on file  . Highest education level: Not on file  Occupational History  . Not on file  Social Needs  . Financial resource strain: Not on file  . Food insecurity:    Worry: Not on file    Inability: Not on file  . Transportation needs:    Medical: Not on file    Non-medical: Not on file  Tobacco Use  . Smoking status: Never Smoker  . Smokeless tobacco: Never Used  . 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 Alcohol use: Yes  . Drug use: No  . Sexual activity: Not on file  Lifestyle  . Physical activity:    Days per week: Not on file    Minutes per session: Not on file  . Stress: Not on file  Relationships  . Social connections:    Talks on phone: Not on file    Gets together: Not on file    Attends religious service: Not on file    Active member of club or organization: Not on file    Attends meetings of  clubs or organizations: Not on file    Relationship status: Not on file  Other Topics Concern  . Not on file  Social History Narrative  . Not on file   No Known Allergies Family History  Problem Relation Age of Onset  . Hyperlipidemia Mother   . Hypertension Mother   . Depression Mother   . Hypertension Father   . Bipolar disorder Sister        x 2     Past medical history, social, surgical and family history all reviewed in electronic medical record.  No pertanent information unless stated regarding to the chief complaint.   Review of Systems:Review of systems updated and as accurate as of 02/28/18  No headache, visual changes, nausea, vomiting, diarrhea, constipation, dizziness, abdominal pain, skin rash, fevers, chills, night sweats, weight loss, swollen lymph nodes, body aches, joint swelling, muscle aches, chest pain, shortness of breath, mood changes.   Objective  Blood pressure 120/86, pulse 83, height 5\' 8"  (1.727 m), weight 185 lb (83.9 kg), SpO2 98 %. Systems examined below as of 02/28/18   General: No apparent distress alert and oriented x3 mood and affect normal, dressed appropriately.  HEENT: Pupils equal, extraocular movements intact  Respiratory: Patient's  speak in full sentences and does not appear short of breath  Cardiovascular: No lower extremity edema, non tender, no erythema  Skin: Warm dry intact with no signs of infection or rash on extremities or on axial skeleton.  Abdomen: Soft nontender  Neuro: Cranial nerves II through XII are intact, neurovascularly intact in all extremities with 2+ DTRs and 2+ pulses.  Lymph: No lymphadenopathy of posterior or anterior cervical chain or axillae bilaterally.  Gait normal with good balance and coordination.  MSK:  Non tender with full range of motion and good stability and symmetric strength and tone of shoulders, elbows, wrist, hip, knee and ankles bilaterally.   Back Exam:  Inspection: Loss of lordosis Motion:  Flexion 45 deg, Extension 25 deg, Side Bending to 35 deg bilaterally,  Rotation to 35 deg bilaterally  SLR laying: Negative  XSLR laying: Negative  Palpable tenderness: Tender to palpation the paraspinal musculature lumbar spine right greater than left mostly L5-S1 positive for the piriformis. FABER: Mild positive in the right. Sensory change: Gross sensation intact to all lumbar and sacral dermatomes.  Reflexes: 2+ at both patellar tendons, 2+ at achilles tendons, Babinski's downgoing.  Strength at foot  Plantar-flexion: 5/5 Dorsi-flexion: 5/5 Eversion: 5/5 Inversion: 5/5  Leg strength  Quad: 5/5 Hamstring: 5/5 Hip flexor: 5/5 Hip abductors: 5/5  Gait unremarkable.  Osteopathic findings C7 flexed rotated and side bent left T3 extended rotated and side bent right inhaled third rib T9 extended rotated and side bent left L2 flexed rotated and side bent right Sacrum right on right    Impression and Recommendations:     This case required medical decision making of moderate complexity.      Note: This dictation was prepared with Dragon dictation along with smaller phrase technology. Any transcriptional errors that result from this process are unintentional.

## 2018-02-28 ENCOUNTER — Ambulatory Visit (INDEPENDENT_AMBULATORY_CARE_PROVIDER_SITE_OTHER): Payer: 59 | Admitting: Family Medicine

## 2018-02-28 ENCOUNTER — Encounter: Payer: Self-pay | Admitting: Family Medicine

## 2018-02-28 VITALS — BP 120/86 | HR 83 | Ht 68.0 in | Wt 185.0 lb

## 2018-02-28 DIAGNOSIS — M5416 Radiculopathy, lumbar region: Secondary | ICD-10-CM | POA: Diagnosis not present

## 2018-02-28 DIAGNOSIS — M999 Biomechanical lesion, unspecified: Secondary | ICD-10-CM | POA: Diagnosis not present

## 2018-02-28 NOTE — Assessment & Plan Note (Signed)
No longer having radicular symptoms.  Continues to have difficulty though he agrees to this.  Patient does not want to have significant intervention at this time.  We discussed the possibility of a second opinion which patient declined.  We discussed icing regimen, topical anti-inflammatories, and other medications.  Patient did not want to make any changes.  Discussed possibly laboratory work-up as long as well.  Follow-up again in 4 weeks

## 2018-02-28 NOTE — Assessment & Plan Note (Signed)
Decision today to treat with OMT was based on Physical Exam  After verbal consent patient was treated with HVLA, ME, FPR techniques in cervical, thoracic, lumbar and sacral areas  Patient tolerated the procedure well with improvement in symptoms  Patient given exercises, stretches and lifestyle modifications  See medications in patient instructions if given  Patient will follow up in 4 weeks 

## 2018-02-28 NOTE — Patient Instructions (Signed)
Good to see you  Shelby [name] is your friend.  4weeks

## 2018-03-08 ENCOUNTER — Encounter: Payer: Self-pay | Admitting: Family Medicine

## 2018-03-10 MED ORDER — TRAMADOL HCL 50 MG PO TABS: 50.0000 mg | ORAL_TABLET | Freq: Two times a day (BID) | ORAL | 0 refills | Status: DC | PRN

## 2018-03-11 ENCOUNTER — Encounter: Payer: Self-pay | Admitting: Family Medicine

## 2018-03-11 NOTE — [facility] Notes (Signed)
[name] [address] [place], [place] [address] Phone: [phone] Subjective:     CC: Back pain follow-up  EHM:[id number]  Shelby [name] is a 42 y.o. female coming in with complaint of back pain. She has been traveling since last visit and feels that her back pain increased due to sleeping in a smaller bed and being seated in the car for greater than 4 hours. Patient does have a nerve root impingement.  Has seen a provider previously and neurosurgery and then discussed fusion which seemed to be too aggressive.  Patient though states that anything more than regular daily activity starts having trouble.  Patient states that there is radiation going down the leg still.    Past Medical History:  Diagnosis Date  . Anxiety   . GERD (gastroesophageal reflux disease)    Past Surgical History:  Procedure Laterality Date  . Cyst remove  12/2009  . INTRAUTERINE DEVICE INSERTION  12/2009   Social History   Socioeconomic History  . Marital status: Married    Spouse name: Not on file  . Number of children: Not on file  . Years of education: Not on file  . Highest education level: Not on file  Occupational History  . Not on file  Social Needs  . Financial resource strain: Not on file  . Food insecurity:    Worry: Not on file    Inability: Not on file  . Transportation needs:    Medical: Not on file    Non-medical: Not on file  Tobacco Use  . Smoking status: Never Smoker  . Smokeless tobacco: Never Used  . 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 Alcohol use: Yes  . Drug use: No  . Sexual activity: Not on file  Lifestyle  . Physical activity:    Days per week: Not on file    Minutes per session: Not on file  . Stress: Not on file  Relationships  . Social connections:    Talks on phone: Not on file    Gets together: Not on file    Attends  religious service: Not on file    Active member of club or organization: Not on file    Attends meetings of clubs or organizations: Not on file    Relationship status: Not on file  Other Topics Concern  . Not on file  Social History Narrative  . Not on file   No Known Allergies Family History  Problem Relation Age of Onset  . Hyperlipidemia Mother   . Hypertension Mother   . Depression Mother   . Hypertension Father   . Bipolar disorder Sister        x 2     Past medical history, social, surgical and family history all reviewed in electronic medical record.  No pertanent information unless stated regarding to the chief complaint.   Review of Systems:Review of systems updated and as accurate as of 03/12/18  No headache, visual changes, nausea, vomiting, diarrhea, constipation, dizziness, abdominal pain, skin rash, fevers, chills, night sweats, weight loss, swollen lymph nodes, body aches, joint swelling,  chest pain, shortness of breath, mood changes.  Positive muscle aches  Objective  Blood pressure 112/84, pulse 81, height 5\' 8"  (1.727 m), weight 187 lb (84.8 kg), SpO2 98 %. Systems examined below as of 03/12/18   General: No  apparent distress alert and oriented x3 mood and affect normal, dressed appropriately.  HEENT: Pupils equal, extraocular movements intact  Respiratory: Patient's speak in full sentences and does not appear short of breath  Cardiovascular: No lower extremity edema, non tender, no erythema  Skin: Warm dry intact with no signs of infection or rash on extremities or on axial skeleton.  Abdomen: Soft nontender  Neuro: Cranial nerves II through XII are intact, neurovascularly intact in all extremities with 2+ DTRs and 2+ pulses.  Lymph: No lymphadenopathy of posterior or anterior cervical chain or axillae bilaterally.  Gait normal with good balance and coordination.  MSK:  Non tender with full range of motion and good stability and symmetric strength and tone  of shoulders, elbows, wrist, hip, knee and ankles bilaterally.  Back Exam:  Inspection: Loss of lordosis Motion: Flexion 35 deg, Extension 20 deg, Side Bending to 35 deg bilaterally,  Rotation to 35 deg bilaterally  SLR laying: Positive right XSLR laying: Negative  Palpable tenderness: Tender over the paraspinal musculature lumbar spine and the right piriformis. FABER: Positive right. Sensory change: Gross sensation intact to all lumbar and sacral dermatomes.  Reflexes: 2+ at both patellar tendons, 2+ at achilles tendons, Babinski's downgoing.  Strength at foot  4+ out of 5 on the right compared to the left  Procedure: Real-time Ultrasound Guided Injection of right piriformis tendon sheath Device: GE Logiq Q7 Ultrasound guided injection is preferred based studies that show increased duration, increased effect, greater accuracy, decreased procedural pain, increased response rate, and decreased cost with ultrasound guided versus blind injection.  Verbal informed consent obtained.  Time-out conducted.  Noted no overlying erythema, induration, or other signs of local infection.  Skin prepped in a sterile fashion.  Local anesthesia: Topical Ethyl chloride.  With sterile technique and under real time ultrasound guidance: With a 21-gauge 2 inch needle patient was injected in the piriformis tendon sheath with 0.5 cc of 0.5% Marcaine and 1 cc of Kenalog 40 mg/mL Completed without difficulty  Pain immediately resolved suggesting accurate placement of the medication.  Advised to call if fevers/chills, erythema, induration, drainage, or persistent bleeding.  Images permanently stored and available for review in the ultrasound unit.  Impression: Technically successful ultrasound guided injection.    Impression and Recommendations:     This case required medical decision making of moderate complexity.      Note: This dictation was prepared with Dragon dictation along with smaller phrase  technology. Any transcriptional errors that result from this process are unintentional.

## 2018-03-12 ENCOUNTER — Ambulatory Visit: Payer: Self-pay

## 2018-03-12 ENCOUNTER — Encounter: Payer: Self-pay | Admitting: Family Medicine

## 2018-03-12 ENCOUNTER — Ambulatory Visit (INDEPENDENT_AMBULATORY_CARE_PROVIDER_SITE_OTHER): Payer: 59 | Admitting: Family Medicine

## 2018-03-12 VITALS — BP 112/84 | HR 81 | Ht 68.0 in | Wt 187.0 lb

## 2018-03-12 DIAGNOSIS — G5701 Lesion of sciatic nerve, right lower limb: Secondary | ICD-10-CM | POA: Diagnosis not present

## 2018-03-12 DIAGNOSIS — J029 Acute pharyngitis, unspecified: Secondary | ICD-10-CM | POA: Diagnosis not present

## 2018-03-12 DIAGNOSIS — M25559 Pain in unspecified hip: Secondary | ICD-10-CM

## 2018-03-12 DIAGNOSIS — R509 Fever, unspecified: Secondary | ICD-10-CM | POA: Diagnosis not present

## 2018-03-12 DIAGNOSIS — M5416 Radiculopathy, lumbar region: Secondary | ICD-10-CM | POA: Diagnosis not present

## 2018-03-12 NOTE — Assessment & Plan Note (Signed)
Patient given injection.  Tolerated procedure well.  Discussed icing regimen and home exercises.  Discussed which activities of doing which wants to avoid.  I do believe that the lumbar radiculopathy is more likely the cause patient's MRI does show a nerve root L5.  Patient will come back and see me again in 3 to 4 weeks to restart osteopathic manipulation.  We will discuss a second opinion with neurosurgery could be beneficial.

## 2018-03-12 NOTE — Patient Instructions (Signed)
Good to see you  Shelby [name] is your friend.  We can consider PRP  I will reach out to stern to see what he says See me again in 3 weeks

## 2018-03-26 NOTE — [facility] Notes (Signed)
[name] [place] [place], [place] [address] Phone: [phone] Subjective:    CC: Back pain follow-up  [id number]:[id number]  Shelby [name] is a 42 y.o. female coming in with complaint of low back pain patient has had this for quite some time.  Has had a nerve root injection that did initially actually responded fairly well to epidural injections.  Patient did see neurosurgery but did want to do an invasive procedure.  We attempted a piriformis injection at last exam that did help significantly.  Patient has been able to play tennis on a more regular basis still has soreness afterwards but nothing severe.       Past Medical History:  Diagnosis Date  . Anxiety   . GERD (gastroesophageal reflux disease)    Past Surgical History:  Procedure Laterality Date  . Cyst remove  12/2009  . INTRAUTERINE DEVICE INSERTION  12/2009   Social History   Socioeconomic History  . Marital status: Married    Spouse name: Not on file  . Number of children: Not on file  . Years of education: Not on file  . Highest education level: Not on file  Occupational History  . Not on file  Social Needs  . Financial resource strain: Not on file  . Food insecurity:    Worry: Not on file    Inability: Not on file  . Transportation needs:    Medical: Not on file    Non-medical: Not on file  Tobacco Use  . Smoking status: Never Smoker  . Smokeless tobacco: Never Used  . 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 Alcohol use: Yes  . Drug use: No  . Sexual activity: Not on file  Lifestyle  . Physical activity:    Days per week: Not on file    Minutes per session: Not on file  . Stress: Not on file  Relationships  . Social connections:    Talks on phone: Not on file    Gets together: Not on file    Attends religious service: Not on file    Active member of club or  organization: Not on file    Attends meetings of clubs or organizations: Not on file    Relationship status: Not on file  Other Topics Concern  . Not on file  Social History Narrative  . Not on file   No Known Allergies Family History  Problem Relation Age of Onset  . Hyperlipidemia Mother   . Hypertension Mother   . Depression Mother   . Hypertension Father   . Bipolar disorder Sister        x 2     Past medical history, social, surgical and family history all reviewed in electronic medical record.  No pertanent information unless stated regarding to the chief complaint.   Review of Systems:Review of systems updated and as accurate as of 03/27/18  No headache, visual changes, nausea, vomiting, diarrhea, constipation, dizziness, abdominal pain, skin rash, fevers, chills, night sweats, weight loss, swollen lymph nodes, body aches, joint swelling, chest pain, shortness of breath, mood changes.  Positive muscle aches  Objective  Blood pressure 124/80, pulse 81, height 5\' 8"  (1.727 m), weight 180 lb (81.6 kg), SpO2 98 %. Systems examined below as of 03/27/18   General: No apparent distress alert and oriented x3 mood and affect normal,  dressed appropriately.  HEENT: Pupils equal, extraocular movements intact  Respiratory: Patient's speak in full sentences and does not appear short of breath  Cardiovascular: No lower extremity edema, non tender, no erythema  Skin: Warm dry intact with no signs of infection or rash on extremities or on axial skeleton.  Abdomen: Soft nontender  Neuro: Cranial nerves II through XII are intact, neurovascularly intact in all extremities with 2+ DTRs and 2+ pulses.  Lymph: No lymphadenopathy of posterior or anterior cervical chain or axillae bilaterally.  Gait normal with good balance and coordination.  MSK:  Non tender with full range of motion and good stability and symmetric strength and tone of shoulders, elbows, wrist, hip, knee and ankles bilaterally.   Back Exam:  Inspection: Unremarkable  Motion: Flexion 45 deg, Extension 25 deg, Side Bending to 45 deg bilaterally,  Rotation to 45 deg bilaterally  SLR laying: Mild positive on the right XSLR laying: Negative  Palpable tenderness: Tenderness of the right piriformis still noted but improved.Marland Kitchen FABER: Mild positive on the right. Sensory change: Gross sensation intact to all lumbar and sacral dermatomes.  Reflexes: 2+ at both patellar tendons, 2+ at achilles tendons, Babinski's downgoing.  Strength at foot  Plantar-flexion: 5/5 Dorsi-flexion: 5/5 Eversion: 5/5 Inversion: 5/5  Leg strength  Quad: 5/5 Hamstring: 5/5 Hip flexor: 5/5 Hip abductors: 5/5  Gait unremarkable.  Osteopathic findings  C6 flexed rotated and side bent left T3 extended rotated and side bent right inhaled third rib T6 extended rotated and side bent left L3 flexed rotated and side bent right Sacrum right on right     Impression and Recommendations:     This case required medical decision making of moderate complexity.      Note: This dictation was prepared with Dragon dictation along with smaller phrase technology. Any transcriptional errors that result from this process are unintentional.

## 2018-03-27 ENCOUNTER — Ambulatory Visit (INDEPENDENT_AMBULATORY_CARE_PROVIDER_SITE_OTHER): Payer: 59 | Admitting: Family Medicine

## 2018-03-27 ENCOUNTER — Encounter: Payer: Self-pay | Admitting: Family Medicine

## 2018-03-27 VITALS — BP 124/80 | HR 81 | Ht 68.0 in | Wt 180.0 lb

## 2018-03-27 DIAGNOSIS — G5701 Lesion of sciatic nerve, right lower limb: Secondary | ICD-10-CM

## 2018-03-27 DIAGNOSIS — M999 Biomechanical lesion, unspecified: Secondary | ICD-10-CM | POA: Diagnosis not present

## 2018-03-27 NOTE — Assessment & Plan Note (Signed)
Improved with the injection.  We discussed icing regimen, home exercises, we discussed focusing on this at this time.  Continue the Effexor as well as a muscle relaxer as needed.  Has responded somewhat to the gabapentin at night.  Follow-up again in 4 weeks

## 2018-03-27 NOTE — Assessment & Plan Note (Signed)
Decision today to treat with OMT was based on Physical Exam  After verbal consent patient was treated with HVLA, ME, FPR techniques in cervical, thoracic, lumbar and sacral areas  Patient tolerated the procedure well with improvement in symptoms  Patient given exercises, stretches and lifestyle modifications  See medications in patient instructions if given  Patient will follow up in 4 weeks 

## 2018-04-01 ENCOUNTER — Encounter: Payer: Self-pay | Admitting: Family Medicine

## 2018-04-02 MED ORDER — TRAMADOL HCL 50 MG PO TABS: 50.0000 mg | ORAL_TABLET | Freq: Two times a day (BID) | ORAL | 0 refills | Status: DC | PRN

## 2018-04-02 MED FILL — traMADol HCL 50 MG TABS: 50 | 30 days supply | Qty: 60 | Fill #0

## 2018-04-08 MED FILL — tiZANidine HCL 4 MG TABS: 4 | 90 days supply | Qty: 90 | Fill #2

## 2018-04-22 ENCOUNTER — Encounter: Payer: Self-pay | Admitting: Family Medicine

## 2018-04-25 NOTE — [facility] Notes (Signed)
[name] [place], [place] [address] Phone: [phone] Subjective:      CC: back pain   NGE:[id number]  Shelby [name] is a 42 y.o. female coming in with complaint of back pain. States she is better than last time. States she's a little tight today.  Nothing severe.  Not having as much radicular symptoms but is concerned that it feels like it is getting a little bit more uncomfortable.  Discussed with patient about icing regimen and home exercise, discussed which activities to do which wants to avoid.  Patient was to try this.  Having difficulty though continuing this.       Past Medical History:  Diagnosis Date  . Anxiety   . GERD (gastroesophageal reflux disease)    Past Surgical History:  Procedure Laterality Date  . Cyst remove  12/2009  . INTRAUTERINE DEVICE INSERTION  12/2009   Social History   Socioeconomic History  . Marital status: Married    Spouse name: Not on file  . Number of children: Not on file  . Years of education: Not on file  . Highest education level: Not on file  Occupational History  . Not on file  Social Needs  . Financial resource strain: Not on file  . Food insecurity:    Worry: Not on file    Inability: Not on file  . Transportation needs:    Medical: Not on file    Non-medical: Not on file  Tobacco Use  . Smoking status: Never Smoker  . Smokeless tobacco: Never Used  . 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 Alcohol use: Yes  . Drug use: No  . Sexual activity: Not on file  Lifestyle  . Physical activity:    Days per week: Not on file    Minutes per session: Not on file  . Stress: Not on file  Relationships  . Social connections:    Talks on phone: Not on file    Gets together: Not on file    Attends religious service: Not on file    Active member of club or organization: Not on file      Attends meetings of clubs or organizations: Not on file    Relationship status: Not on file  Other Topics Concern  . Not on file  Social History Narrative  . Not on file   No Known Allergies Family History  Problem Relation Age of Onset  . Hyperlipidemia Mother   . Hypertension Mother   . Depression Mother   . Hypertension Father   . Bipolar disorder Sister        x 2     Past medical history, social, surgical and family history all reviewed in electronic medical record.  No pertanent information unless stated regarding to the chief complaint.   Review of Systems:Review of systems updated and as accurate as of 04/28/18  No headache, visual changes, nausea, vomiting, diarrhea, constipation, dizziness, abdominal pain, skin rash, fevers, chills, night sweats, weight loss, swollen lymph nodes, body aches, joint swelling,  chest pain, shortness of breath, mood changes.  Positive muscle aches  Objective  Blood pressure 120/80, pulse 60, height 5\' 8"  (1.727 m), weight 185 lb (83.9 kg), SpO2 98 %. Systems examined below as of 04/28/18   General: No apparent distress alert and oriented x3 mood and affect  normal, dressed appropriately.  HEENT: Pupils equal, extraocular movements intact  Respiratory: Patient's speak in full sentences and does not appear short of breath  Cardiovascular: No lower extremity edema, non tender, no erythema  Skin: Warm dry intact with no signs of infection or rash on extremities or on axial skeleton.  Abdomen: Soft nontender  Neuro: Cranial nerves II through XII are intact, neurovascularly intact in all extremities with 2+ DTRs and 2+ pulses.  Lymph: No lymphadenopathy of posterior or anterior cervical chain or axillae bilaterally.  Gait normal with good balance and coordination.  MSK:  Non tender with full range of motion and good stability and symmetric strength and tone of shoulders, elbows, wrist, hip, knee and ankles bilaterally.  Back Exam:   Inspection: Loss of lordosis Motion: Flexion 25 deg worsening pain with flexion, Extension 15 deg, Side Bending to 45 deg bilaterally,  Rotation to 45 deg bilaterally  SLR laying: Positive with mild radicular symptoms XSLR laying: Negative  Palpable tenderness: Tenderness to palpation in the paraspinal musculature lumbar spine right greater than left as well as the right piriformis. FABER: Tightness. Sensory change: Gross sensation intact to all lumbar and sacral dermatomes.  Reflexes: 2+ at both patellar tendons, 2+ at achilles tendons, Babinski's downgoing.  Strength at foot  Plantar-flexion: 5/5 Dorsi-flexion: 5/5 Eversion: 5/5 Inversion: 5/5  Leg strength  Quad: 5/5 Hamstring: 5/5 Hip flexor: 5/5 Hip abductors: 5/5  Gait unremarkable.    Osteopathic findings C2 flexed rotated and side bent right C4 flexed rotated and side bent left C6 flexed rotated and side bent left T3 extended rotated and side bent right inhaled third rib T9 extended rotated and side bent left L2 flexed rotated and side bent right Sacrum right on right  Impression and Recommendations:     This case required medical decision making of moderate complexity.      Note: This dictation was prepared with Dragon dictation along with smaller phrase technology. Any transcriptional errors that result from this process are unintentional.

## 2018-04-28 ENCOUNTER — Other Ambulatory Visit: Payer: Self-pay

## 2018-04-28 ENCOUNTER — Encounter: Payer: Self-pay | Admitting: Family Medicine

## 2018-04-28 ENCOUNTER — Ambulatory Visit (INDEPENDENT_AMBULATORY_CARE_PROVIDER_SITE_OTHER): Payer: 59 | Admitting: Family Medicine

## 2018-04-28 VITALS — BP 120/80 | HR 60 | Ht 68.0 in | Wt 185.0 lb

## 2018-04-28 DIAGNOSIS — M999 Biomechanical lesion, unspecified: Secondary | ICD-10-CM

## 2018-04-28 DIAGNOSIS — M5416 Radiculopathy, lumbar region: Secondary | ICD-10-CM

## 2018-04-28 MED ORDER — VENLAFAXINE HCL ER 75 MG PO CP24: 75.0000 mg | ORAL_CAPSULE | Freq: Every day | ORAL | 1 refills | Status: DC

## 2018-04-28 MED FILL — VENLAFAXINE HCL ER 75 MG CA: 75 | 90 days supply | Qty: 90 | Fill #0

## 2018-04-28 NOTE — Assessment & Plan Note (Signed)
Decision today to treat with OMT was based on Physical Exam  After verbal consent patient was treated with HVLA, ME, FPR techniques in cervical, thoracic, rib,  lumbar and sacral areas  Patient tolerated the procedure well with improvement in symptoms  Patient given exercises, stretches and lifestyle modifications  See medications in patient instructions if given  Patient will follow up in 4-8 weeks 

## 2018-04-28 NOTE — Patient Instructions (Signed)
Good to see you  Ice is your friend    

## 2018-04-28 NOTE — Assessment & Plan Note (Signed)
Patient is starting to have signs of radicular symptoms again.  Has responded well to the epidural and the last one was 5 months ago.  Discussed with patient in great length about repeating.  Discussed with patient also about consideration for surgical intervention.  Patient refill of Effexor at the 75 mg.  Patient did not want to increase the amount.  Discussed icing regimen and home exercise.  Follow-up with me again in 4 to 8 weeks

## 2018-05-21 ENCOUNTER — Encounter: Payer: Self-pay | Admitting: Family Medicine

## 2018-05-21 DIAGNOSIS — M5416 Radiculopathy, lumbar region: Secondary | ICD-10-CM

## 2018-05-29 ENCOUNTER — Ambulatory Visit
Admission: RE | Admit: 2018-05-29 | Discharge: 2018-05-29 | Disposition: A | Discharge status: Home / Self Care | Payer: 59 | Source: Ambulatory Visit | Attending: Family Medicine | Admitting: Family Medicine

## 2018-05-29 DIAGNOSIS — M5116 Intervertebral disc disorders with radiculopathy, lumbar region: Secondary | ICD-10-CM | POA: Diagnosis not present

## 2018-05-29 DIAGNOSIS — M5416 Radiculopathy, lumbar region: Secondary | ICD-10-CM

## 2018-05-29 MED ORDER — METHYLPREDNISOLONE ACETATE 40 MG/ML INJ SUSP (RADIOLOG
120.0000 mg | Freq: Once | INTRAMUSCULAR | Status: AC
  Administered 2018-05-29: 120 mg via EPIDURAL

## 2018-05-29 MED ORDER — IOPAMIDOL (ISOVUE-M 200) INJECTION 41%
1.0000 mL | Freq: Once | INTRAMUSCULAR | Status: AC
  Administered 2018-05-29: 1 mL via EPIDURAL

## 2018-05-29 NOTE — Discharge Instructions (Signed)

## 2018-07-02 ENCOUNTER — Encounter: Payer: Self-pay | Admitting: Family Medicine

## 2018-07-03 ENCOUNTER — Ambulatory Visit (INDEPENDENT_AMBULATORY_CARE_PROVIDER_SITE_OTHER): Payer: 59 | Admitting: Family Medicine

## 2018-07-03 ENCOUNTER — Encounter: Payer: Self-pay | Admitting: Family Medicine

## 2018-07-03 VITALS — BP 110/80 | HR 75 | Ht 68.0 in | Wt 186.0 lb

## 2018-07-03 DIAGNOSIS — M533 Sacrococcygeal disorders, not elsewhere classified: Secondary | ICD-10-CM | POA: Diagnosis not present

## 2018-07-03 DIAGNOSIS — M999 Biomechanical lesion, unspecified: Secondary | ICD-10-CM | POA: Diagnosis not present

## 2018-07-03 NOTE — Assessment & Plan Note (Signed)
Decision today to treat with OMT was based on Physical Exam  After verbal consent patient was treated with HVLA, ME, FPR techniques in cervical, thoracic, rib,  lumbar and sacral areas  Patient tolerated the procedure well with improvement in symptoms  Patient given exercises, stretches and lifestyle modifications  See medications in patient instructions if given  Patient will follow up in 4-8 weeks 

## 2018-07-03 NOTE — [facility] Notes (Signed)
[name] [place], [place] [address] Phone: [phone] Subjective:    I [name] am serving as a Education administrator for Dr. Hulan Saas.    CC: Back pain follow-up  URK:[id number]  Shelby [name] is a 42 y.o. female coming in with complaint of back pain. Right sided pain. Epidural a month ago. Was doing better.  Patient still has significant amount of muscle soreness after activities though.  Patient denies any significant radiation though.    Past Medical History:  Diagnosis Date  . Anxiety   . GERD (gastroesophageal reflux disease)    Past Surgical History:  Procedure Laterality Date  . Cyst remove  12/2009  . INTRAUTERINE DEVICE INSERTION  12/2009   Social History   Socioeconomic History  . Marital status: Married    Spouse name: Not on file  . Number of children: Not on file  . Years of education: Not on file  . Highest education level: Not on file  Occupational History  . Not on file  Social Needs  . Financial resource strain: Not on file  . Food insecurity:    Worry: Not on file    Inability: Not on file  . Transportation needs:    Medical: Not on file    Non-medical: Not on file  Tobacco Use  . Smoking status: Never Smoker  . Smokeless tobacco: Never Used  . 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 Alcohol use: Yes  . Drug use: No  . Sexual activity: Not on file  Lifestyle  . Physical activity:    Days per week: Not on file    Minutes per session: Not on file  . Stress: Not on file  Relationships  . Social connections:    Talks on phone: Not on file    Gets together: Not on file    Attends religious service: Not on file    Active member of club or organization: Not on file    Attends meetings of clubs or organizations: Not on file    Relationship status: Not on file  Other Topics Concern  . Not on file    Social History Narrative  . Not on file   No Known Allergies Family History  Problem Relation Age of Onset  . Hyperlipidemia Mother   . Hypertension Mother   . Depression Mother   . Hypertension Father   . Bipolar disorder Sister        x 2    Current Outpatient Medications (Endocrine & Metabolic):  .  levonorgestrel (MIRENA) 20 MCG/24HR IUD, 1 Intra Uterine Device (1 each total) by Intrauterine route once.    Current Outpatient Medications (Analgesics):  .  traMADol (ULTRAM) 50 MG tablet, Take 1 tablet (50 mg total) by mouth every 12 (twelve) hours as needed.   Current Outpatient Medications (Other):  .  gabapentin (NEURONTIN) 100 MG capsule, Take 2 capsules (200 mg total) by mouth at bedtime. .  potassium chloride SA (K-DUR,KLOR-CON) 20 MEQ tablet, Take 0.5 tablets (10 mEq total) by mouth daily. Marland Kitchen  tiZANidine (ZANAFLEX) 4 MG tablet, Take 1 tablet (4 mg total) by mouth Nightly. .  venlafaxine XR (EFFEXOR XR) 75 MG 24 hr capsule, Take 1 capsule (75 mg total) by mouth daily with breakfast.    Past medical history, social, surgical and family history all reviewed in electronic  medical record.  No pertanent information unless stated regarding to the chief complaint.   Review of Systems:  No headache, visual changes, nausea, vomiting, diarrhea, constipation, dizziness, abdominal pain, skin rash, fevers, chills, night sweats, weight loss, swollen lymph nodes, body aches, joint swelling,  chest pain, shortness of breath, mood changes.  Mild positive muscle aches  Objective  Blood pressure 110/80, pulse 75, height 5\' 8"  (1.727 m), weight 186 lb (84.4 kg), SpO2 96 %.    General: No apparent distress alert and oriented x3 mood and affect normal, dressed appropriately.  HEENT: Pupils equal, extraocular movements intact  Respiratory: Patient's speak in full sentences and does not appear short of breath  Cardiovascular: No lower extremity edema, non tender, no erythema  Skin: Warm  dry intact with no signs of infection or rash on extremities or on axial skeleton.  Abdomen: Soft nontender  Neuro: Cranial nerves II through XII are intact, neurovascularly intact in all extremities with 2+ DTRs and 2+ pulses.  Lymph: No lymphadenopathy of posterior or anterior cervical chain or axillae bilaterally.  Gait normal with good balance and coordination.  MSK:  Non tender with full range of motion and good stability and symmetric strength and tone of shoulders, elbows, wrist, hip, knee and ankles bilaterally.  Back Exam:  Inspection: Unremarkable  Motion: Flexion 45 deg, Extension 25 deg, Side Bending to 35 deg bilaterally,  Rotation to 45 deg bilaterally  SLR laying: Negative  XSLR laying: Negative  Palpable tenderness: Tender to palpation the paraspinal musculature lumbar spine lumbosacral area bilaterally seems to be irritated.Marland Kitchen FABER: Tightness bilaterally. Sensory change: Gross sensation intact to all lumbar and sacral dermatomes.  Reflexes: 2+ at both patellar tendons, 2+ at achilles tendons, Babinski's downgoing.  Strength at foot  Plantar-flexion: 5/5 Dorsi-flexion: 5/5 Eversion: 5/5 Inversion: 5/5  Leg strength  Quad: 5/5 Hamstring: 5/5 Hip flexor: 5/5 Hip abductors: 5/5  Gait unremarkable.  Osteopathic findings C2 flexed rotated and side bent right T9 extended rotated and side bent left L4 flexed rotated and side bent left Sacrum right on right    Impression and Recommendations:     This case required medical decision making of moderate complexity. The above documentation has been reviewed and is accurate and complete Lyndal Pulley, DO       Note: This dictation was prepared with Dragon dictation along with smaller phrase technology. Any transcriptional errors that result from this process are unintentional.

## 2018-07-03 NOTE — Patient Instructions (Signed)
Good to see you See me when you need me  Enjoy the shotgun weeding

## 2018-07-03 NOTE — Assessment & Plan Note (Signed)
Continues to have some tightness.  Piriformis as well as lumbar radiculopathy.  Has responded well to that certain injections but everything seems to be intermittent.  Patient wants to avoid any surgical intervention.  Continue with home exercises as well as osteopathic manipulation.  Will consider piriformis injection again if needed.  Follow-up again in 4 to 8 weeks

## 2018-07-14 ENCOUNTER — Encounter: Payer: Self-pay | Admitting: Family Medicine

## 2018-07-15 ENCOUNTER — Other Ambulatory Visit: Payer: Self-pay | Admitting: *Deleted

## 2018-07-15 MED ORDER — GABAPENTIN 100 MG PO CAPS: 200.0000 mg | ORAL_CAPSULE | Freq: Every day | ORAL | 1 refills | Status: DC

## 2018-07-15 MED ORDER — TIZANIDINE HCL 4 MG PO TABS: 4.0000 mg | ORAL_TABLET | Freq: Every evening | ORAL | 1 refills | Status: DC

## 2018-07-23 MED FILL — GABAPENTIN 100 MG CAP: 100 | 90 days supply | Qty: 180 | Fill #0

## 2018-07-23 MED FILL — tiZANidine HCL 4 MG TABS: 4 | 90 days supply | Qty: 90 | Fill #0

## 2018-07-24 ENCOUNTER — Encounter: Payer: Self-pay | Admitting: Family Medicine

## 2018-07-28 ENCOUNTER — Ambulatory Visit (INDEPENDENT_AMBULATORY_CARE_PROVIDER_SITE_OTHER): Payer: 59 | Admitting: Family Medicine

## 2018-07-28 ENCOUNTER — Encounter: Payer: Self-pay | Admitting: Family Medicine

## 2018-07-28 VITALS — BP 122/98 | HR 53 | Ht 68.0 in

## 2018-07-28 DIAGNOSIS — M999 Biomechanical lesion, unspecified: Secondary | ICD-10-CM

## 2018-07-28 DIAGNOSIS — M533 Sacrococcygeal disorders, not elsewhere classified: Secondary | ICD-10-CM | POA: Diagnosis not present

## 2018-07-28 NOTE — [facility] Notes (Signed)
[name] [place], [place] [address] Phone: [phone] Subjective:     CC: Back pain follow-up  [id number]  Shelby [name] is a 42 y.o. female coming in with complaint of back pain. She has not had any change in her pain. Is able to manage her pain with OMT and is here for that today.  Patient states that she was doing very well until she came off of her medications because she ran out of them.  Also recently moved.      Past Medical History:  Diagnosis Date  . Anxiety   . GERD (gastroesophageal reflux disease)    Past Surgical History:  Procedure Laterality Date  . Cyst remove  12/2009  . INTRAUTERINE DEVICE INSERTION  12/2009   Social History   Socioeconomic History  . Marital status: Married    Spouse name: Not on file  . Number of children: Not on file  . Years of education: Not on file  . Highest education level: Not on file  Occupational History  . Not on file  Social Needs  . Financial resource strain: Not on file  . Food insecurity:    Worry: Not on file    Inability: Not on file  . Transportation needs:    Medical: Not on file    Non-medical: Not on file  Tobacco Use  . Smoking status: Never Smoker  . Smokeless tobacco: Never Used  . 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 Alcohol use: Yes  . Drug use: No  . Sexual activity: Not on file  Lifestyle  . Physical activity:    Days per week: Not on file    Minutes per session: Not on file  . Stress: Not on file  Relationships  . Social connections:    Talks on phone: Not on file    Gets together: Not on file    Attends religious service: Not on file    Active member of club or organization: Not on file    Attends meetings of clubs or organizations: Not on file    Relationship status: Not on file  Other Topics Concern  . Not on file  Social History  Narrative  . Not on file   No Known Allergies Family History  Problem Relation Age of Onset  . Hyperlipidemia Mother   . Hypertension Mother   . Depression Mother   . Hypertension Father   . Bipolar disorder Sister        x 2    Current Outpatient Medications (Endocrine & Metabolic):  .  levonorgestrel (MIRENA) 20 MCG/24HR IUD, 1 Intra Uterine Device (1 each total) by Intrauterine route once.    Current Outpatient Medications (Analgesics):  .  traMADol (ULTRAM) 50 MG tablet, Take 1 tablet (50 mg total) by mouth every 12 (twelve) hours as needed.   Current Outpatient Medications (Other):  .  gabapentin (NEURONTIN) 100 MG capsule, Take 2 capsules (200 mg total) by mouth at bedtime. .  potassium chloride SA (K-DUR,KLOR-CON) 20 MEQ tablet, Take 0.5 tablets (10 mEq total) by mouth daily. Marland Kitchen  tiZANidine (ZANAFLEX) 4 MG tablet, Take 1 tablet (4 mg total) by mouth Nightly. .  venlafaxine XR (EFFEXOR XR) 75 MG 24 hr capsule, Take 1 capsule (75 mg total) by mouth daily with breakfast.    Past medical history, social, surgical  and family history all reviewed in electronic medical record.  No pertanent information unless stated regarding to the chief complaint.   Review of Systems:  No headache, visual changes, nausea, vomiting, diarrhea, constipation, dizziness, abdominal pain, skin rash, fevers, chills, night sweats, weight loss, swollen lymph nodes, body aches, joint swelling, muscle aches, chest pain, shortness of breath, mood changes.   Objective  There were no vitals taken for this visit. Systems examined below as of    General: No apparent distress alert and oriented x3 mood and affect normal, dressed appropriately.  HEENT: Pupils equal, extraocular movements intact  Respiratory: Patient's speak in full sentences and does not appear short of breath  Cardiovascular: No lower extremity edema, non tender, no erythema  Skin: Warm dry intact with no signs of infection or rash on  extremities or on axial skeleton.  Abdomen: Soft nontender  Neuro: Cranial nerves II through XII are intact, neurovascularly intact in all extremities with 2+ DTRs and 2+ pulses.  Lymph: No lymphadenopathy of posterior or anterior cervical chain or axillae bilaterally.  Gait normal with good balance and coordination.  MSK:  Non tender with full range of motion and good stability and symmetric strength and tone of shoulders, elbows, wrist, hip, knee and ankles bilaterally.  Back Exam:  Inspection: Loss of lordosis Motion: Flexion 35 deg, Extension 25 deg, Side Bending to 35 deg bilaterally,  Rotation to 35 deg bilaterally  SLR laying: Negative tight hamstrings noted but no radicular symptoms XSLR laying: Negative  Palpable tenderness: Tender to palpation over the sacroiliac joints bilaterally. FABER: Tightness bilaterally. Sensory change: Gross sensation intact to all lumbar and sacral dermatomes.  Reflexes: 2+ at both patellar tendons, 2+ at achilles tendons, Babinski's downgoing.  Strength at foot  Plantar-flexion: 5/5 Dorsi-flexion: 5/5 Eversion: 5/5 Inversion: 5/5  Leg strength  Quad: 5/5 Hamstring: 5/5 Hip flexor: 5/5 Hip abductors: 5/5  Gait unremarkable.  Osteopathic findings  C2 flexed rotated and side bent right C4 flexed rotated and side bent left C7 flexed rotated and side bent left T3 extended rotated and side bent right inhaled third rib T7 extended rotated and side bent left L3 flexed rotated and side bent right Sacrum right on right     Impression and Recommendations:     This case required medical decision making of moderate complexity. The above documentation has been reviewed and is accurate and complete [name]       Note: This dictation was prepared with Dragon dictation along with smaller phrase technology. Any transcriptional errors that result from this process are unintentional.

## 2018-07-28 NOTE — Assessment & Plan Note (Signed)
Decision today to treat with OMT was based on Physical Exam  After verbal consent patient was treated with HVLA, ME, FPR techniques in cervical, thoracic, rib, lumbar and sacral areas  Patient tolerated the procedure well with improvement in symptoms  Patient given exercises, stretches and lifestyle modifications  See medications in patient instructions if given  Patient will follow up in 4-6 weeks 

## 2018-07-28 NOTE — Assessment & Plan Note (Signed)
Sacroiliac dysfunction.  Has had a lumbar radiculopathy.  Does have a nerve root impingement.  Has responded to piriformis injections previously.  Does respond well to her gabapentin if she takes it regularly.  Sometimes noncompliant.  Follow-up with me again in 4 to 6 weeks

## 2018-07-28 NOTE — Patient Instructions (Signed)
God to see you  4 weeks

## 2018-08-18 ENCOUNTER — Encounter: Payer: Self-pay | Admitting: Family Medicine

## 2018-08-19 ENCOUNTER — Ambulatory Visit (INDEPENDENT_AMBULATORY_CARE_PROVIDER_SITE_OTHER): Payer: 59 | Admitting: Family Medicine

## 2018-08-19 ENCOUNTER — Encounter: Payer: Self-pay | Admitting: Family Medicine

## 2018-08-19 VITALS — BP 120/86 | HR 71 | Ht 68.0 in

## 2018-08-19 DIAGNOSIS — M999 Biomechanical lesion, unspecified: Secondary | ICD-10-CM | POA: Diagnosis not present

## 2018-08-19 DIAGNOSIS — M5416 Radiculopathy, lumbar region: Secondary | ICD-10-CM

## 2018-08-19 DIAGNOSIS — Z23 Encounter for immunization: Secondary | ICD-10-CM

## 2018-08-19 NOTE — [facility] Notes (Signed)
[name] [place], [place] [address] Phone: [phone] Subjective:   [name], am serving as a scribe for Dr. Hulan Saas.   CC: Back pain follow-up  [id number]  Shelby [name] is a 42 y.o. female coming in with complaint of back pain. She has been feeling the same since last visit. No change in her pain. She feels very tight in the morning she states.  Patient has had no nerve root impingement previously.  We discussed different medications which she has been somewhat noncompliant on.  Patient does not want any surgical intervention but does respond fairly well to epidurals.  Patient also responds well to manipulation.     Past Medical History:  Diagnosis Date  . Anxiety   . GERD (gastroesophageal reflux disease)    Past Surgical History:  Procedure Laterality Date  . Cyst remove  12/2009  . INTRAUTERINE DEVICE INSERTION  12/2009   Social History   Socioeconomic History  . Marital status: Married    Spouse name: Not on file  . Number of children: Not on file  . Years of education: Not on file  . Highest education level: Not on file  Occupational History  . Not on file  Social Needs  . Financial resource strain: Not on file  . Food insecurity:    Worry: Not on file    Inability: Not on file  . Transportation needs:    Medical: Not on file    Non-medical: Not on file  Tobacco Use  . Smoking status: Never Smoker  . Smokeless tobacco: Never Used  . 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 Alcohol use: Yes  . Drug use: No  . Sexual activity: Not on file  Lifestyle  . Physical activity:    Days per week: Not on file    Minutes per session: Not on file  . Stress: Not on file  Relationships  . Social connections:    Talks on phone: Not on file    Gets together: Not on file    Attends religious service: Not  on file    Active member of club or organization: Not on file    Attends meetings of clubs or organizations: Not on file    Relationship status: Not on file  Other Topics Concern  . Not on file  Social History Narrative  . Not on file   No Known Allergies Family History  Problem Relation Age of Onset  . Hyperlipidemia Mother   . Hypertension Mother   . Depression Mother   . Hypertension Father   . Bipolar disorder Sister        x 2    Current Outpatient Medications (Endocrine & Metabolic):  .  levonorgestrel (MIRENA) 20 MCG/24HR IUD, 1 Intra Uterine Device (1 each total) by Intrauterine route once.    Current Outpatient Medications (Analgesics):  .  traMADol (ULTRAM) 50 MG tablet, Take 1 tablet (50 mg total) by mouth every 12 (twelve) hours as needed.   Current Outpatient Medications (Other):  .  gabapentin (NEURONTIN) 100 MG capsule, Take 2 capsules (200 mg total) by mouth at bedtime. .  potassium chloride SA (K-DUR,KLOR-CON) 20 MEQ tablet, Take 0.5 tablets (10 mEq total) by mouth daily. Marland Kitchen  tiZANidine (ZANAFLEX) 4 MG tablet, Take 1 tablet (4 mg total) by mouth Nightly. Marland Kitchen  venlafaxine XR (EFFEXOR XR) 75 MG 24 hr capsule, Take 1 capsule (75 mg total) by mouth daily with breakfast.    Past medical history, social, surgical and family history all reviewed in electronic medical record.  No pertanent information unless stated regarding to the chief complaint.   Review of Systems:  No headache, visual changes, nausea, vomiting, diarrhea, constipation, dizziness, abdominal pain, skin rash, fevers, chills, night sweats, weight loss, swollen lymph nodes, body aches, joint swelling,  chest pain, shortness of breath, mood changes.  Positive muscle aches  Objective  Blood pressure 120/86, pulse 71, height 5\' 8"  (1.727 m), SpO2 98 %.   General: No apparent distress alert and oriented x3 mood and affect normal, dressed appropriately.  HEENT: Pupils equal, extraocular movements intact    Respiratory: Patient's speak in full sentences and does not appear short of breath  Cardiovascular: No lower extremity edema, non tender, no erythema  Skin: Warm dry intact with no signs of infection or rash on extremities or on axial skeleton.  Abdomen: Soft nontender  Neuro: Cranial nerves II through XII are intact, neurovascularly intact in all extremities with 2+ DTRs and 2+ pulses.  Lymph: No lymphadenopathy of posterior or anterior cervical chain or axillae bilaterally.  Gait normal with good balance and coordination.  MSK:  Non tender with full range of motion and good stability and symmetric strength and tone of shoulders, elbows, wrist, hip, knee and ankles bilaterally.   Back Exam:  Inspection: Unremarkable  Motion: Flexion 45 deg, Extension 45 deg, Side Bending to 45 deg bilaterally,  Rotation to 45 deg bilaterally  SLR laying: Negative  XSLR laying: Negative  Palpable tenderness: Tender to palpation diffusely in the paraspinal musculature in the lumbar spine. FABER: negative. Sensory change: Gross sensation intact to all lumbar and sacral dermatomes.  Reflexes: 2+ at both patellar tendons, 2+ at achilles tendons, Babinski's downgoing.  Strength at foot  Plantar-flexion: 5/5 Dorsi-flexion: 5/5 Eversion: 5/5 Inversion: 5/5  Leg strength  Quad: 5/5 Hamstring: 5/5 Hip flexor: 5/5 Hip abductors: 4/5 but symmetric Gait unremarkable.  Osteopathic findings C2 flexed rotated and side bent right C6 flexed rotated and side bent left T3 extended rotated and side bent right inhaled third rib T9 extended rotated and side bent left L3 flexed rotated and side bent left Sacrum right on right    Impression and Recommendations:     This case required medical decision making of moderate complexity. The above documentation has been reviewed and is accurate and complete Lyndal Pulley, DO       Note: This dictation was prepared with Dragon dictation along with smaller phrase  technology. Any transcriptional errors that result from this process are unintentional.

## 2018-08-19 NOTE — Assessment & Plan Note (Signed)
Continues to have intermittent pain.  We discussed with her and repeating the potential for the piriformis injections given that patient did respond fairly well to as well.  Patient also encouraged to take the gabapentin or any of the other medications we have tried on a more regular basis.  Responds well to manipulation.  Follow-up again in 4 to 6 weeks

## 2018-08-19 NOTE — Assessment & Plan Note (Signed)
Decision today to treat with OMT was based on Physical Exam  After verbal consent patient was treated with HVLA, ME, FPR techniques in cervical, thoracic, rib,  lumbar and sacral areas  Patient tolerated the procedure well with improvement in symptoms  Patient given exercises, stretches and lifestyle modifications  See medications in patient instructions if given  Patient will follow up in 4-8 weeks 

## 2018-08-19 NOTE — Patient Instructions (Signed)
Good to see you  Shelby [name] is your friend Call your friend Angelita Ingles

## 2018-09-02 ENCOUNTER — Encounter: Payer: Self-pay | Admitting: Family Medicine

## 2018-09-02 MED ORDER — TRAMADOL HCL 50 MG PO TABS: 50.0000 mg | ORAL_TABLET | Freq: Two times a day (BID) | ORAL | 0 refills | Status: DC | PRN

## 2018-09-02 MED FILL — traMADol HCL 50 MG TABS: 50 | 30 days supply | Qty: 60 | Fill #0

## 2018-09-02 MED FILL — VENLAFAXINE HCL ER 75 MG CA: 75 | 90 days supply | Qty: 90 | Fill #1

## 2018-09-16 MED FILL — VENLAFAXINE HCL ER 75 MG CA: 75 | 90 days supply | Qty: 90 | Fill #1

## 2018-09-16 MED FILL — traMADol HCL 50 MG TABS: 50 | 30 days supply | Qty: 60 | Fill #0

## 2018-09-18 ENCOUNTER — Ambulatory Visit
Admission: RE | Admit: 2018-09-18 | Discharge: 2018-09-18 | Disposition: A | Discharge status: Home / Self Care | Payer: 59 | Source: Ambulatory Visit | Attending: Family Medicine | Admitting: Family Medicine

## 2018-09-18 DIAGNOSIS — M5416 Radiculopathy, lumbar region: Secondary | ICD-10-CM

## 2018-09-18 DIAGNOSIS — M545 Low back pain: Secondary | ICD-10-CM | POA: Diagnosis not present

## 2018-09-18 MED ORDER — IOPAMIDOL (ISOVUE-M 200) INJECTION 41%
1.0000 mL | Freq: Once | INTRAMUSCULAR | Status: AC
  Administered 2018-09-18: 1 mL via EPIDURAL

## 2018-09-18 MED ORDER — METHYLPREDNISOLONE ACETATE 40 MG/ML INJ SUSP (RADIOLOG
120.0000 mg | Freq: Once | INTRAMUSCULAR | Status: AC
  Administered 2018-09-18: 120 mg via EPIDURAL

## 2018-09-18 NOTE — Discharge Instructions (Signed)

## 2018-09-23 DIAGNOSIS — Z6828 Body mass index (BMI) 28.0-28.9, adult: Secondary | ICD-10-CM | POA: Diagnosis not present

## 2018-09-23 DIAGNOSIS — N390 Urinary tract infection, site not specified: Secondary | ICD-10-CM | POA: Diagnosis not present

## 2018-09-23 DIAGNOSIS — Z01419 Encounter for gynecological examination (general) (routine) without abnormal findings: Secondary | ICD-10-CM | POA: Diagnosis not present

## 2018-10-06 DIAGNOSIS — Z1231 Encounter for screening mammogram for malignant neoplasm of breast: Secondary | ICD-10-CM | POA: Diagnosis not present

## 2018-10-22 MED FILL — OSELTAMIVIR PHOSPHATE 75 MG: 75 | 10 days supply | Qty: 10 | Fill #0

## 2018-10-23 IMAGING — XA Imaging study
2 series · 2 of 2 positions shown · non-contrast
Comparison: none

CLINICAL DATA: Spondylosis without myelopathy. Good response to
previous right-sided injection. Recurrence of pain, this time on the
left.

[Series 1: ortho standard · 1 of 1 slices shown (1 of 2)]
[im 1/1]
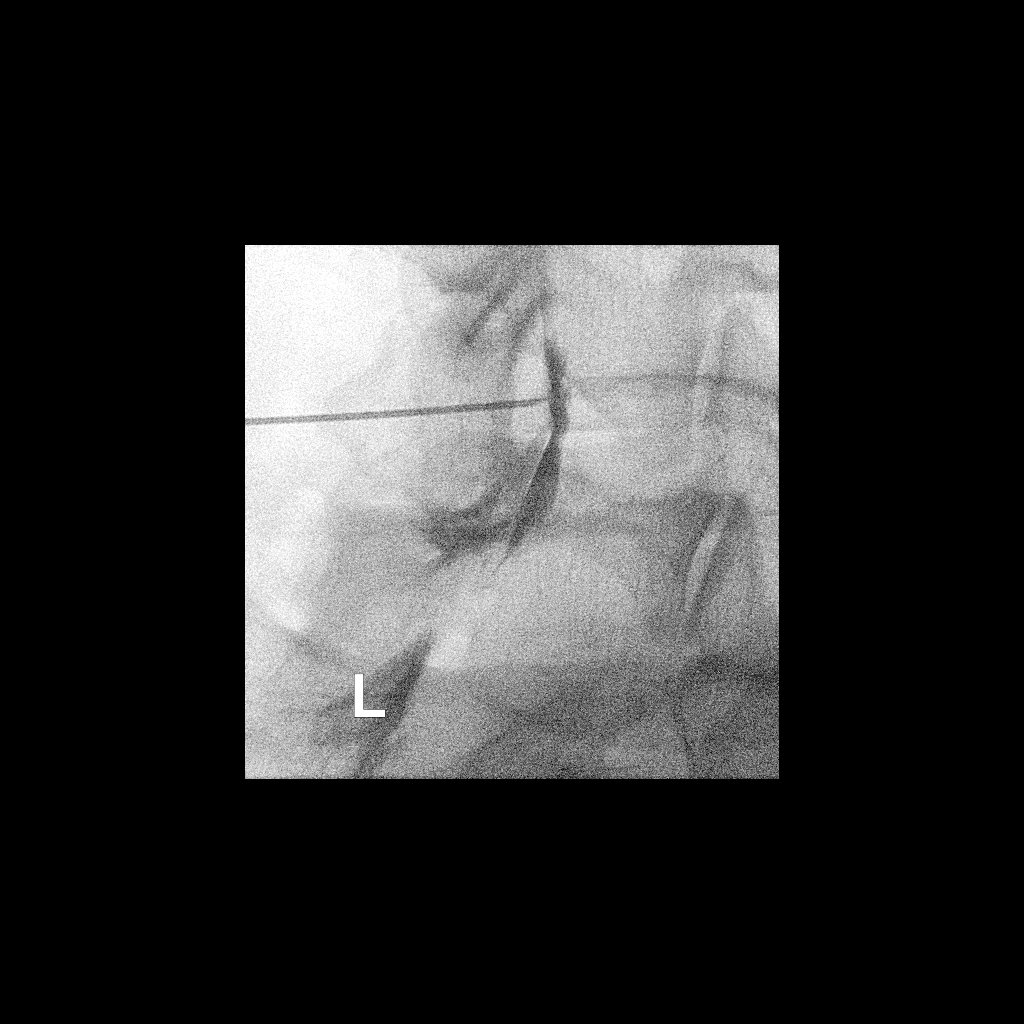

[Series 2: ortho standard · 1 of 1 slices shown (2 of 2)]
[im 1/1]
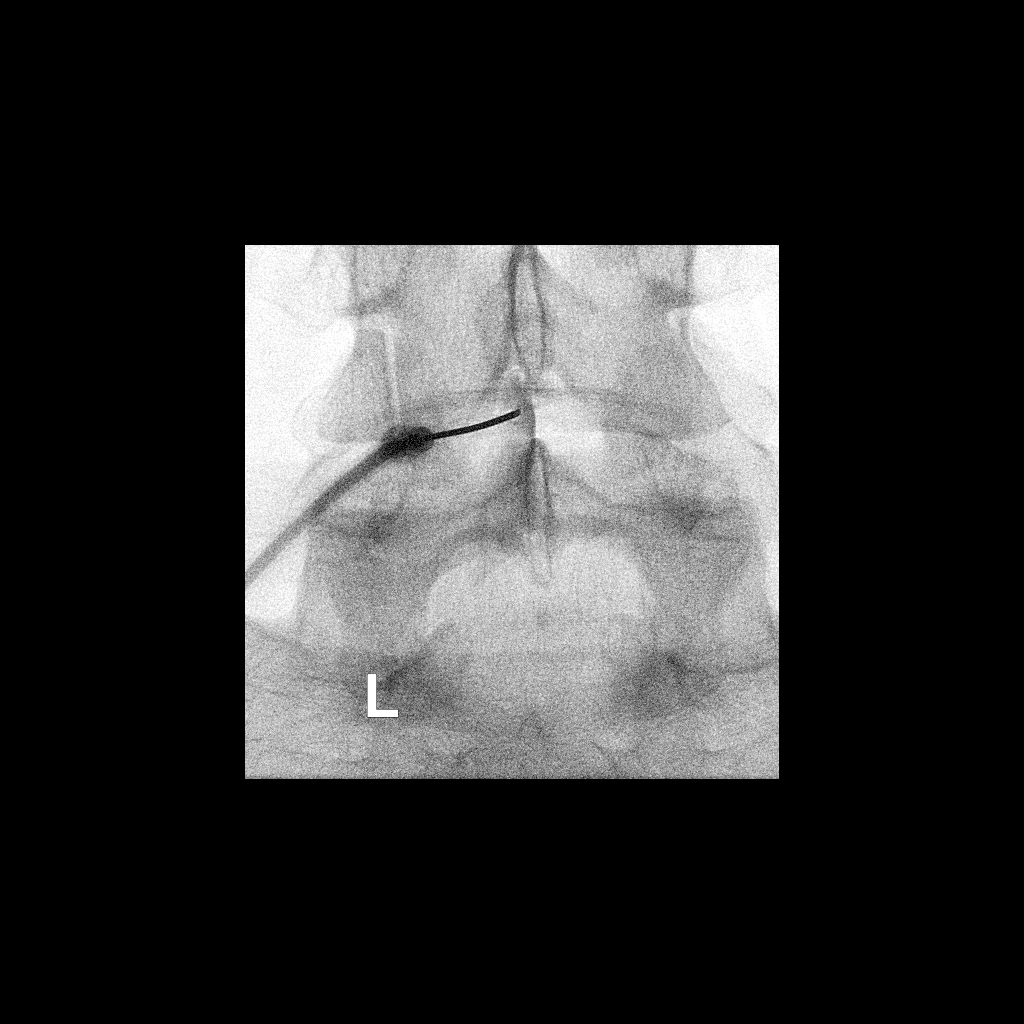

[2 of 2 positions shown; findings below may reference images not displayed]

FLUOROSCOPY TIME:  0 minutes 10 seconds. 7.96 micro gray meter
squared

PROCEDURE:
The procedure, risks, benefits, and alternatives were explained to
the patient. Questions regarding the procedure were encouraged and
answered. The patient understands and consents to the procedure.

LUMBAR EPIDURAL INJECTION:

An interlaminar approach was performed on left at L4-5. The
overlying skin was cleansed and anesthetized. A 20 gauge epidural
needle was advanced using loss-of-resistance technique.

DIAGNOSTIC EPIDURAL INJECTION:

Injection of Isovue-M 200 shows a good epidural pattern with spread
above and below the level of needle placement, primarily on the
left. No vascular opacification is seen.

THERAPEUTIC EPIDURAL INJECTION:

One hundred twenty Mg of Depo-Medrol mixed with 2.5 cc 1% lidocaine
were instilled. The procedure was well-tolerated, and the patient
was discharged thirty minutes following the injection in good
condition.

COMPLICATIONS:
None
IMPRESSION: Technically successful epidural injection on the left at L4-5.

## 2018-11-04 ENCOUNTER — Encounter: Payer: Self-pay | Admitting: Family Medicine

## 2018-11-04 ENCOUNTER — Other Ambulatory Visit: Payer: Self-pay | Admitting: *Deleted

## 2018-11-04 MED ORDER — TIZANIDINE HCL 4 MG PO TABS: 4.0000 mg | ORAL_TABLET | Freq: Every evening | ORAL | 1 refills | Status: DC

## 2018-11-04 MED FILL — tiZANidine HCL 4 MG TABS: 4 | 90 days supply | Qty: 90 | Fill #0

## 2018-11-05 NOTE — [facility] Notes (Signed)
[name] [place], [name] [address] Phone: [phone] Subjective:   Shelby Shelby [name], am serving as a scribe for Dr. Hulan Saas.   CC: Neck and  Back pain follow-up  [id number]  Shelby Shelby [name] is a 43 y.o. female coming in with complaint of back pain. Did have an epidural in December which allevated her pain. Is having patient has been doing relatively well.  Unfortunately has severe pain whenever she does get the injection for 1 day and is concerned that this is meaning that things are worsening.  Has been able to do yoga and 2 times a week since the new year.  Noticing she is getting stronger without any worsening pain.       Past Medical History:  Diagnosis Date  . Anxiety   . GERD (gastroesophageal reflux disease)    Past Surgical History:  Procedure Laterality Date  . Cyst remove  12/2009  . INTRAUTERINE DEVICE INSERTION  12/2009   Social History   Socioeconomic History  . Marital status: Married    Spouse name: Not on file  . Number of children: Not on file  . Years of education: Not on file  . Highest education level: Not on file  Occupational History  . Not on file  Social Needs  . Financial resource strain: Not on file  . Food insecurity:    Worry: Not on file    Inability: Not on file  . Transportation needs:    Medical: Not on file    Non-medical: Not on file  Tobacco Use  . Smoking status: Never Smoker  . Smokeless tobacco: Never Used  . 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 Alcohol use: Yes  . Drug use: Shelby [name]  . Sexual activity: Not on file  Lifestyle  . Physical activity:    Days per week: Not on file    Minutes per session: Not on file  . Stress: Not on file  Relationships  . Social connections:    Talks on phone: Not on file    Gets together: Not on file    Attends religious service: Not on  file    Active member of club or organization: Not on file    Attends meetings of clubs or organizations: Not on file    Relationship status: Not on file  Other Topics Concern  . Not on file  Social History Narrative  . Not on file   Shelby [name] Known Allergies Family History  Problem Relation Age of Onset  . Hyperlipidemia Mother   . Hypertension Mother   . Depression Mother   . Hypertension Father   . Bipolar disorder Sister        x 2    Current Outpatient Medications (Endocrine & Metabolic):  .  levonorgestrel (MIRENA) 20 MCG/24HR IUD, 1 Intra Uterine Device (1 each total) by Intrauterine route once.    Current Outpatient Medications (Analgesics):  .  traMADol (ULTRAM) 50 MG tablet, Take 1 tablet (50 mg total) by mouth every 12 (twelve) hours as needed.   Current Outpatient Medications (Other):  .  gabapentin (NEURONTIN) 100 MG capsule, Take 2 capsules (200 mg total) by mouth at bedtime. .  potassium chloride SA (K-DUR,KLOR-CON) 20 MEQ tablet, Take 0.5 tablets (10 mEq total) by mouth daily. Marland Kitchen  tiZANidine (ZANAFLEX) 4 MG tablet, Take 1 tablet (  4 mg total) by mouth Nightly. .  venlafaxine XR (EFFEXOR XR) 75 MG 24 hr capsule, Take 1 capsule (75 mg total) by mouth daily with breakfast.    Past medical history, social, surgical and family history all reviewed in electronic medical record.  Shelby [name] information unless stated regarding to the chief complaint.   Review of Systems:  Shelby [name] headache, visual changes, nausea, vomiting, diarrhea, constipation, dizziness, abdominal pain, skin rash, fevers, chills, night sweats, weight loss, swollen lymph nodes, body aches, joint swelling,  chest pain, shortness of breath, mood changes.  Mild positive muscle aches  Objective  Blood pressure 110/82, pulse 93, height 5\' 8"  (1.727 m), weight 186 lb (84.4 kg), SpO2 98 %.    General: Shelby [name] apparent distress alert and oriented x3 mood and affect normal, dressed appropriately.  HEENT: Pupils equal,  extraocular movements intact  Respiratory: Patient's speak in full sentences and does not appear short of breath  Cardiovascular: Shelby [name] lower extremity edema, non tender, Shelby [name] erythema  Skin: Warm dry intact with Shelby [name] signs of infection or rash on extremities or on axial skeleton.  Abdomen: Soft nontender  Neuro: Cranial nerves II through XII are intact, neurovascularly intact in all extremities with 2+ DTRs and 2+ pulses.  Lymph: Shelby [name] lymphadenopathy of posterior or anterior cervical chain or axillae bilaterally.  Gait normal with good balance and coordination.  MSK:  Non tender with full range of motion and good stability and symmetric strength and tone of shoulders, elbows, wrist, hip, knee and ankles bilaterally.  Back Exam:  Inspection: Unremark 6 able  Motion: Flexion 35 deg, Extension 15 deg, Side Bending to 35 deg bilaterally,  Rotation to 35 deg bilaterally  SLR laying: Negative  XSLR laying: Negative  Palpable tenderness: More tenderness over the paraspinal musculature of the left lumbar spine. FABER: Tightness left greater than right. Sensory change: Gross sensation intact to all lumbar and sacral dermatomes.  Reflexes: 2+ at both patellar tendons, 2+ at achilles tendons, Babinski's downgoing.  Strength at foot  Plantar-flexion: 5/5 Dorsi-flexion: 5/5 Eversion: 5/5 Inversion: 5/5  Leg strength  Quad: 5/5 Hamstring: 5/5 Hip flexor: 5/5 Hip abductors: 4+/5    Osteopathic findings T7 extended rotated and side bent left L1 flexed rotated and side bent right Sacrum right on right    Impression and Recommendations:     This case required medical decision making of moderate complexity. The above documentation has been reviewed and is accurate and complete Shelby Pulley, DO       Note: This dictation was prepared with Dragon dictation along with smaller phrase technology. Any transcriptional errors that result from this process are unintentional.

## 2018-11-06 ENCOUNTER — Encounter: Payer: Self-pay | Admitting: Family Medicine

## 2018-11-06 ENCOUNTER — Ambulatory Visit: Payer: 59 | Admitting: Family Medicine

## 2018-11-06 VITALS — BP 110/82 | HR 93 | Ht 68.0 in | Wt 186.0 lb

## 2018-11-06 DIAGNOSIS — M5416 Radiculopathy, lumbar region: Secondary | ICD-10-CM | POA: Diagnosis not present

## 2018-11-06 DIAGNOSIS — M999 Biomechanical lesion, unspecified: Secondary | ICD-10-CM

## 2018-11-06 NOTE — Patient Instructions (Signed)
Good to see you  Ice is your friend Stay active I say go to town and lets see how it goes.  See me again in 6 weeks

## 2018-11-06 NOTE — Assessment & Plan Note (Signed)
Decision today to treat with OMT was based on Physical Exam  After verbal consent patient was treated with HVLA, ME, FPR techniques in  thoracic, lumbar and sacral areas  Patient tolerated the procedure well with improvement in symptoms  Patient given exercises, stretches and lifestyle modifications  See medications in patient instructions if given  Patient will follow up in 4-8 weeks 

## 2018-11-06 NOTE — Assessment & Plan Note (Signed)
No significant radicular symptoms noted today.  Taking the gabapentin intermittently.  Effexor daily.  Discussed icing regimen and home exercise.  Discussed posture and ergonomics.  Patient is working out in order to continue to try to increase.  Follow-up again in 4 to 8 weeks

## 2018-11-21 ENCOUNTER — Encounter: Payer: Self-pay | Admitting: Family Medicine

## 2018-11-26 NOTE — [facility] Notes (Signed)
[name] [place] [place], [place] [address] Phone: [phone] Subjective:   [name], am serving as a scribe for Dr. Hulan Saas.    CC: Back and hip pain  [id number]  Shelby [name] is a 43 y.o. female coming in with complaint of back and hip pain. Pain is worse in her hips bilaterally. Also feels tight. She has been doing yoga and playing tennis. Dry needling helps to improve her pain for a few weeks.  Patient feels that the pain seems to be worsening.  Still considering the possible surgical intervention for patient having the nerve root impingement.  Patient has not followed up with the neurosurgery at this time though.  Patient feels that it has changed a little bit.  Pain still seems to be out of proportion.     Past Medical History:  Diagnosis Date  . Anxiety   . GERD (gastroesophageal reflux disease)    Past Surgical History:  Procedure Laterality Date  . Cyst remove  12/2009  . INTRAUTERINE DEVICE INSERTION  12/2009   Social History   Socioeconomic History  . Marital status: Married    Spouse name: Not on file  . Number of children: Not on file  . Years of education: Not on file  . Highest education level: Not on file  Occupational History  . Not on file  Social Needs  . Financial resource strain: Not on file  . Food insecurity:    Worry: Not on file    Inability: Not on file  . Transportation needs:    Medical: Not on file    Non-medical: Not on file  Tobacco Use  . Smoking status: Never Smoker  . Smokeless tobacco: Never Used  . 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 Alcohol use: Yes  . Drug use: No  . Sexual activity: Not on file  Lifestyle  . Physical activity:    Days per week: Not on file    Minutes per session: Not on file  . Stress: Not on file  Relationships  . Social connections:    Talks  on phone: Not on file    Gets together: Not on file    Attends religious service: Not on file    Active member of club or organization: Not on file    Attends meetings of clubs or organizations: Not on file    Relationship status: Not on file  Other Topics Concern  . Not on file  Social History Narrative  . Not on file   No Known Allergies Family History  Problem Relation Age of Onset  . Hyperlipidemia Mother   . Hypertension Mother   . Depression Mother   . Hypertension Father   . Bipolar disorder Sister        x 2    Current Outpatient Medications (Endocrine & Metabolic):  .  levonorgestrel (MIRENA) 20 MCG/24HR IUD, 1 Intra Uterine Device (1 each total) by Intrauterine route once.    Current Outpatient Medications (Analgesics):  .  traMADol (ULTRAM) 50 MG tablet, Take 1 tablet (50 mg total) by mouth every 12 (twelve) hours as needed.   Current Outpatient Medications (Other):  .  gabapentin (NEURONTIN) 100 MG capsule, Take 2 capsules (200 mg total) by mouth at bedtime. .  potassium chloride SA (K-DUR,KLOR-CON) 20 MEQ tablet, Take 0.5 tablets (10  mEq total) by mouth daily. Marland Kitchen  tiZANidine (ZANAFLEX) 4 MG tablet, Take 1 tablet (4 mg total) by mouth Nightly. .  venlafaxine XR (EFFEXOR XR) 75 MG 24 hr capsule, Take 1 capsule (75 mg total) by mouth daily with breakfast. .  Vitamin D, Ergocalciferol, (DRISDOL) 1.25 MG (50000 UT) CAPS capsule, Take 1 capsule (50,000 Units total) by mouth every 7 (seven) days.    Past medical history, social, surgical and family history all reviewed in electronic medical record.  No pertanent information unless stated regarding to the chief complaint.   Review of Systems:  No headache, visual changes, nausea, vomiting, diarrhea, constipation, dizziness, abdominal pain, skin rash, fevers, chills, night sweats, weight loss, swollen lymph nodes, body aches, joint swelling,, chest pain, shortness of breath, mood changes.  Positive muscle  aches  Objective  Blood pressure 110/84, pulse 82, height 5\' 8"  (1.727 m), weight 189 lb (85.7 kg), SpO2 99 %.   General: No apparent distress alert and oriented x3 mood and affect normal, dressed appropriately.  HEENT: Pupils equal, extraocular movements intact  Respiratory: Patient's speak in full sentences and does not appear short of breath  Cardiovascular: No lower extremity edema, non tender, no erythema  Skin: Warm dry intact with no signs of infection or rash on extremities or on axial skeleton.  Abdomen: Soft nontender  Neuro: Cranial nerves II through XII are intact, neurovascularly intact in all extremities with 2+ DTRs and 2+ pulses.  Lymph: No lymphadenopathy of posterior or anterior cervical chain or axillae bilaterally.  Gait normal with good balance and coordination.  MSK:  tender with full range of motion and good stability and symmetric strength and tone of shoulders, elbows, wrist, hip, knee and ankles bilaterally.  \Back exam has some mild loss of lordosis.  Tenderness to palpation severely over the greater trochanteric area bilaterally as well as the left piriformis.  More pain in the sacroiliac joints bilaterally.  Mild positive straight leg test noted with significant tightness of the hamstrings bilaterally.  Patient does have some weakness of hip extension on the left side.  Little different than previous exam.  Deep tendon reflexes intact.  Neurovascular intact distally  Osteopathic findings  T6 extended rotated and side bent left L2 flexed rotated and side bent right Sacrum right on right     Impression and Recommendations:     This case required medical decision making of moderate complexity. The above documentation has been reviewed and is accurate and complete Lyndal Pulley, DO       Note: This dictation was prepared with Dragon dictation along with smaller phrase technology. Any transcriptional errors that result from this process are unintentional.

## 2018-11-27 ENCOUNTER — Ambulatory Visit: Payer: 59 | Admitting: Family Medicine

## 2018-11-27 ENCOUNTER — Encounter: Payer: Self-pay | Admitting: Family Medicine

## 2018-11-27 VITALS — BP 110/84 | HR 82 | Ht 68.0 in | Wt 189.0 lb

## 2018-11-27 DIAGNOSIS — M999 Biomechanical lesion, unspecified: Secondary | ICD-10-CM | POA: Diagnosis not present

## 2018-11-27 DIAGNOSIS — M2550 Pain in unspecified joint: Secondary | ICD-10-CM

## 2018-11-27 DIAGNOSIS — M5416 Radiculopathy, lumbar region: Secondary | ICD-10-CM | POA: Diagnosis not present

## 2018-11-27 MED ORDER — VITAMIN D (ERGOCALCIFEROL) 1.25 MG (50000 UNIT) PO CAPS: 50000.0000 [IU] | ORAL_CAPSULE | ORAL | 0 refills | Status: DC

## 2018-11-27 MED FILL — VIT D2 1.25 MG (50,000 UNIT: 1.25 MG | 84 days supply | Qty: 12 | Fill #0

## 2018-11-27 NOTE — Assessment & Plan Note (Signed)
History of lumbar radiculopathy.  Discussed with patient with icing regimen and home exercises.  Discussed posture and ergonomics.  Discussed continuing the same medications at this time.  Patient is using gabapentin intermittently.  Patient's pain is not disabling but unfortunately is constant.  We did discuss possible laboratory work-up and labs ordered today to rule out autoimmune diseases that could be contributing to some of the aches and pains as well as ruling out elevated uric acid.  Patient does give history of some recent spotting even with IUD so we will check hormone levels as well.  Responds well to manipulation.  Follow-up again in 4 weeks

## 2018-11-27 NOTE — Assessment & Plan Note (Signed)
Decision today to treat with OMT was based on Physical Exam  After verbal consent patient was treated with HVLA, ME, FPR techniques in cervical, thoracic, lumbar and sacral areas  Patient tolerated the procedure well with improvement in symptoms  Patient given exercises, stretches and lifestyle modifications  See medications in patient instructions if given  Patient will follow up in 4 weeks 

## 2018-11-27 NOTE — Patient Instructions (Signed)
Calcium pyruvate 1500mg  daily  Once weekly vitamin D Labs when you can  Enjoy the weekend See me again in 3-4 weeks

## 2019-02-04 ENCOUNTER — Other Ambulatory Visit: Payer: Self-pay | Admitting: *Deleted

## 2019-02-04 ENCOUNTER — Encounter: Payer: Self-pay | Admitting: Family Medicine

## 2019-02-04 DIAGNOSIS — M5416 Radiculopathy, lumbar region: Secondary | ICD-10-CM

## 2019-02-12 MED ORDER — TIZANIDINE HCL 4 MG PO TABS: 4.0000 mg | ORAL_TABLET | Freq: Every evening | ORAL | 1 refills | Status: DC

## 2019-02-12 MED ORDER — VENLAFAXINE HCL ER 75 MG PO CP24: 75.0000 mg | ORAL_CAPSULE | Freq: Every day | ORAL | 1 refills | Status: DC

## 2019-02-13 MED FILL — VENLAFAXINE HCL ER 75 MG CA: 75 | 90 days supply | Qty: 90 | Fill #0

## 2019-02-13 MED FILL — tiZANidine HCL 4 MG TABS: 4 | 90 days supply | Qty: 90 | Fill #0

## 2019-02-17 ENCOUNTER — Other Ambulatory Visit: Payer: Self-pay

## 2019-02-17 ENCOUNTER — Ambulatory Visit
Admission: RE | Admit: 2019-02-17 | Discharge: 2019-02-17 | Disposition: A | Discharge status: Home / Self Care | Payer: 59 | Source: Ambulatory Visit | Attending: Family Medicine | Admitting: Family Medicine

## 2019-02-17 DIAGNOSIS — M5416 Radiculopathy, lumbar region: Secondary | ICD-10-CM | POA: Diagnosis not present

## 2019-02-17 MED ORDER — IOPAMIDOL (ISOVUE-M 200) INJECTION 41%
1.0000 mL | Freq: Once | INTRAMUSCULAR | Status: AC
  Administered 2019-02-17: 1 mL via EPIDURAL

## 2019-02-17 MED ORDER — METHYLPREDNISOLONE ACETATE 40 MG/ML INJ SUSP (RADIOLOG
120.0000 mg | Freq: Once | INTRAMUSCULAR | Status: AC
  Administered 2019-02-17: 15:00:00 120 mg via EPIDURAL

## 2019-02-17 NOTE — Discharge Instructions (Signed)

## 2019-02-18 DIAGNOSIS — L578 Other skin changes due to chronic exposure to nonionizing radiation: Secondary | ICD-10-CM | POA: Diagnosis not present

## 2019-02-18 DIAGNOSIS — D2261 Melanocytic nevi of right upper limb, including shoulder: Secondary | ICD-10-CM | POA: Diagnosis not present

## 2019-02-18 DIAGNOSIS — Z419 Encounter for procedure for purposes other than remedying health state, unspecified: Secondary | ICD-10-CM | POA: Diagnosis not present

## 2019-02-18 DIAGNOSIS — D225 Melanocytic nevi of trunk: Secondary | ICD-10-CM | POA: Diagnosis not present

## 2019-02-18 DIAGNOSIS — D1801 Hemangioma of skin and subcutaneous tissue: Secondary | ICD-10-CM | POA: Diagnosis not present

## 2019-02-18 DIAGNOSIS — D224 Melanocytic nevi of scalp and neck: Secondary | ICD-10-CM | POA: Diagnosis not present

## 2019-05-03 ENCOUNTER — Encounter: Payer: Self-pay | Admitting: Family Medicine

## 2019-05-04 ENCOUNTER — Other Ambulatory Visit: Payer: Self-pay

## 2019-05-04 ENCOUNTER — Encounter: Payer: Self-pay | Admitting: Family Medicine

## 2019-05-04 ENCOUNTER — Ambulatory Visit: Payer: 59 | Admitting: Family Medicine

## 2019-05-04 VITALS — BP 140/92 | HR 72 | Ht 68.0 in | Wt 183.0 lb

## 2019-05-04 DIAGNOSIS — M999 Biomechanical lesion, unspecified: Secondary | ICD-10-CM | POA: Diagnosis not present

## 2019-05-04 DIAGNOSIS — M533 Sacrococcygeal disorders, not elsewhere classified: Secondary | ICD-10-CM | POA: Diagnosis not present

## 2019-05-04 NOTE — [facility] Notes (Signed)
[name] [place], [place] [address] Phone: [phone] Subjective:   I [name] am serving as a Education administrator for Dr. Hulan Saas.   CC: Back pain follow-up  [id number]  Shelby [name] is a 43 y.o. female coming in with complaint of back pain. States that she is tight today. Has been exercising and states "the back doesn't work that well".   Patient has been very active though.  Been able to do yoga, playing tennis, denies any significant radiation but seems to be intermittent.  Patient continues to have some difficulty though and never without some type of discomfort or pain.     Past Medical History:  Diagnosis Date  . Anxiety   . GERD (gastroesophageal reflux disease)    Past Surgical History:  Procedure Laterality Date  . Cyst remove  12/2009  . INTRAUTERINE DEVICE INSERTION  12/2009   Social History   Socioeconomic History  . Marital status: Married    Spouse name: Not on file  . Number of children: Not on file  . Years of education: Not on file  . Highest education level: Not on file  Occupational History  . Not on file  Social Needs  . Financial resource strain: Not on file  . Food insecurity    Worry: Not on file    Inability: Not on file  . Transportation needs    Medical: Not on file    Non-medical: Not on file  Tobacco Use  . Smoking status: Never Smoker  . Smokeless tobacco: Never Used  . 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 Alcohol use: Yes  . Drug use: No  . Sexual activity: Not on file  Lifestyle  . Physical activity    Days per week: Not on file    Minutes per session: Not on file  . Stress: Not on file  Relationships  . Social Herbalist on phone: Not on file    Gets together: Not on file    Attends religious service: Not on file    Active member of club or organization: Not on  file    Attends meetings of clubs or organizations: Not on file    Relationship status: Not on file  Other Topics Concern  . Not on file  Social History Narrative  . Not on file   No Known Allergies Family History  Problem Relation Age of Onset  . Hyperlipidemia Mother   . Hypertension Mother   . Depression Mother   . Hypertension Father   . Bipolar disorder Sister        x 2    Current Outpatient Medications (Endocrine & Metabolic):  .  levonorgestrel (MIRENA) 20 MCG/24HR IUD, 1 Intra Uterine Device (1 each total) by Intrauterine route once.    Current Outpatient Medications (Analgesics):  .  traMADol (ULTRAM) 50 MG tablet, Take 1 tablet (50 mg total) by mouth every 12 (twelve) hours as needed.   Current Outpatient Medications (Other):  .  gabapentin (NEURONTIN) 100 MG capsule, Take 2 capsules (200 mg total) by mouth at bedtime. .  potassium chloride SA (K-DUR,KLOR-CON) 20 MEQ tablet, Take 0.5 tablets (10 mEq total) by mouth daily. Marland Kitchen  tiZANidine (ZANAFLEX) 4 MG tablet, Take 1 tablet (4 mg total) by mouth Nightly. .  venlafaxine XR (EFFEXOR XR) 75 MG 24  hr capsule, Take 1 capsule (75 mg total) by mouth daily with breakfast. .  Vitamin D, Ergocalciferol, (DRISDOL) 1.25 MG (50000 UT) CAPS capsule, Take 1 capsule (50,000 Units total) by mouth every 7 (seven) days.    Past medical history, social, surgical and family history all reviewed in electronic medical record.  No pertanent information unless stated regarding to the chief complaint.   Review of Systems:  No headache, visual changes, nausea, vomiting, diarrhea, constipation, dizziness, abdominal pain, skin rash, fevers, chills, night sweats, weight loss, swollen lymph nodes, body aches, joint swelling,, chest pain, shortness of breath, mood changes.  Positive muscle aches  Objective  Blood pressure (!) 140/92, pulse 72, height 5\' 8"  (1.727 m), weight 183 lb (83 kg), SpO2 98 %.    General: No apparent distress alert and  oriented x3 mood and affect normal, dressed appropriately.  HEENT: Pupils equal, extraocular movements intact  Respiratory: Patient's speak in full sentences and does not appear short of breath  Cardiovascular: No lower extremity edema, non tender, no erythema  Skin: Warm dry intact with no signs of infection or rash on extremities or on axial skeleton.  Abdomen: Soft nontender  Neuro: Cranial nerves II through XII are intact, neurovascularly intact in all extremities with 2+ DTRs and 2+ pulses.  Lymph: No lymphadenopathy of posterior or anterior cervical chain or axillae bilaterally.  Gait normal with good balance and coordination.  MSK:  Non tender with full range of motion and good stability and symmetric strength and tone of shoulders, elbows, wrist, hip, knee and ankles bilaterally.  Back Exam:  Inspection: Mild loss of lordosis Motion: Flexion 40 deg, Extension 25 deg, Side Bending to 35 deg bilaterally,  Rotation to 35 deg bilaterally  SLR laying: Negative  XSLR laying: Negative  Palpable tenderness: Tender to palpation in paraspinal musculature lumbar spine right greater than left. FABER: Positive Faber. Sensory change: Gross sensation intact to all lumbar and sacral dermatomes.  Reflexes: 2+ at both patellar tendons, 2+ at achilles tendons, Babinski's downgoing.  Strength at foot  Plantar-flexion: 5/5 Dorsi-flexion: 5/5 Eversion: 5/5 Inversion: 5/5  Leg strength  Quad: 5/5 Hamstring: 5/5 Hip flexor: 5/5 Hip abductors: 5/5    Osteopathic findings  C6 flexed rotated and side bent left T3 extended rotated and side bent right inhaled third rib T9 extended rotated and side bent left L2 flexed rotated and side bent right Sacrum right on right    Impression and Recommendations:     This case required medical decision making of moderate complexity. The above documentation has been reviewed and is accurate and complete Lyndal Pulley, DO       Note: This dictation was  prepared with Dragon dictation along with smaller phrase technology. Any transcriptional errors that result from this process are unintentional.

## 2019-05-04 NOTE — Assessment & Plan Note (Signed)
Decision today to treat with OMT was based on Physical Exam  After verbal consent patient was treated with HVLA, ME, FPR techniques in  thoracic, lumbar and sacral areas  Patient tolerated the procedure well with improvement in symptoms  Patient given exercises, stretches and lifestyle modifications  See medications in patient instructions if given  Patient will follow up in 4-8 weeks 

## 2019-05-04 NOTE — Assessment & Plan Note (Signed)
Likely patient is not having any radicular symptoms at this point.  We may need to consider repeat MRI if patient wants to be more aggressive.  Has had 2 epidurals in the last 12 months.  Patient has tramadol for breakthrough pain and takes a muscle relaxer fairly regularly.  Patient will follow-up with me again in 4 to 8 weeks.

## 2019-05-15 ENCOUNTER — Other Ambulatory Visit: Payer: Self-pay

## 2019-05-15 ENCOUNTER — Encounter: Payer: Self-pay | Admitting: Family Medicine

## 2019-05-15 ENCOUNTER — Ambulatory Visit (INDEPENDENT_AMBULATORY_CARE_PROVIDER_SITE_OTHER): Payer: 59 | Admitting: Family Medicine

## 2019-05-15 VITALS — BP 106/80 | HR 82 | Ht 68.0 in | Wt 183.0 lb

## 2019-05-15 DIAGNOSIS — M999 Biomechanical lesion, unspecified: Secondary | ICD-10-CM | POA: Diagnosis not present

## 2019-05-15 DIAGNOSIS — M5416 Radiculopathy, lumbar region: Secondary | ICD-10-CM

## 2019-05-15 DIAGNOSIS — G8929 Other chronic pain: Secondary | ICD-10-CM

## 2019-05-15 DIAGNOSIS — M549 Dorsalgia, unspecified: Secondary | ICD-10-CM | POA: Diagnosis not present

## 2019-05-15 NOTE — Assessment & Plan Note (Signed)
Decision today to treat with OMT was based on Physical Exam  After verbal consent patient was treated with HVLA, ME, FPR techniques in  thoracic, lumbar and sacral areas  Patient tolerated the procedure well with improvement in symptoms  Patient given exercises, stretches and lifestyle modifications  See medications in patient instructions if given  Patient will follow up in 4 weeks 

## 2019-05-15 NOTE — [facility] Notes (Signed)
[name] [place],  [address] Phone: [phone] Subjective:   I Shelby [name] am serving as a Education administrator for Dr. Hulan Saas.   CC: Low back pain  [id number]  Shelby [name] is a 43 y.o. female coming in with complaint of back pain. States the last 2 weeks have been better.  Patient is never really without any type of discomfort though.  Patient states that she is having pain most of the time.  Sometimes severe enough that stops her from certain activities, still having radicular symptoms as well.  Patient denies though any weakness.  Patient overhead is somewhat disgruntled because she is got undergone many different injections, formal physical therapies, and has been having manipulation for quite some time and is not made complete resolution of the pain.  Patient is considering the possibility of surgical intervention     Past Medical History:  Diagnosis Date  . Anxiety   . GERD (gastroesophageal reflux disease)    Past Surgical History:  Procedure Laterality Date  . Cyst remove  12/2009  . INTRAUTERINE DEVICE INSERTION  12/2009   Social History   Socioeconomic History  . Marital status: Married    Spouse name: Not on file  . Number of children: Not on file  . Years of education: Not on file  . Highest education level: Not on file  Occupational History  . Not on file  Social Needs  . Financial resource strain: Not on file  . Food insecurity    Worry: Not on file    Inability: Not on file  . Transportation needs    Medical: Not on file    Non-medical: Not on file  Tobacco Use  . Smoking status: Never Smoker  . Smokeless tobacco: Never Used  . 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 Alcohol use: Yes  . Drug use: No  . Sexual activity: Not on file  Lifestyle  . Physical activity    Days per week: Not on file   Minutes per session: Not on file  . Stress: Not on file  Relationships  . Social Herbalist on phone: Not on file    Gets together: Not on file    Attends religious service: Not on file    Active member of club or organization: Not on file    Attends meetings of clubs or organizations: Not on file    Relationship status: Not on file  Other Topics Concern  . Not on file  Social History Narrative  . Not on file   No Known Allergies Family History  Problem Relation Age of Onset  . Hyperlipidemia Mother   . Hypertension Mother   . Depression Mother   . Hypertension Father   . Bipolar disorder Sister        x 2    Current Outpatient Medications (Endocrine & Metabolic):  .  levonorgestrel (MIRENA) 20 MCG/24HR IUD, 1 Intra Uterine Device (1 each total) by Intrauterine route once.    Current Outpatient Medications (Analgesics):  .  traMADol (ULTRAM) 50 MG tablet, Take 1 tablet (50 mg total) by mouth every 12 (twelve) hours as needed.   Current Outpatient Medications (Other):  .  gabapentin (NEURONTIN) 100 MG capsule, Take 2 capsules (200 mg total) by mouth at bedtime. .  potassium chloride SA (K-DUR,KLOR-CON) 20  MEQ tablet, Take 0.5 tablets (10 mEq total) by mouth daily. Marland Kitchen  tiZANidine (ZANAFLEX) 4 MG tablet, Take 1 tablet (4 mg total) by mouth Nightly. .  venlafaxine XR (EFFEXOR XR) 75 MG 24 hr capsule, Take 1 capsule (75 mg total) by mouth daily with breakfast. .  Vitamin D, Ergocalciferol, (DRISDOL) 1.25 MG (50000 UT) CAPS capsule, Take 1 capsule (50,000 Units total) by mouth every 7 (seven) days.    Past medical history, social, surgical and family history all reviewed in electronic medical record.  No pertanent information unless stated regarding to the chief complaint.   Review of Systems:  No headache, visual changes, nausea, vomiting, diarrhea, constipation, dizziness, abdominal pain, skin rash, fevers, chills, night sweats, weight loss, swollen lymph nodes,  body aches, joint swelling, muscle aches, chest pain, shortness of breath, mood changes.   Objective  Blood pressure 106/80, pulse 82, height 5\' 8"  (1.727 m), weight 183 lb (83 kg), SpO2 98 %.    General: No apparent distress alert and oriented x3 mood and affect normal, dressed appropriately.  HEENT: Pupils equal, extraocular movements intact  Respiratory: Patient's speak in full sentences and does not appear short of breath  Cardiovascular: No lower extremity edema, non tender, no erythema  Skin: Warm dry intact with no signs of infection or rash on extremities or on axial skeleton.  Abdomen: Soft nontender  Neuro: Cranial nerves II through XII are intact, neurovascularly intact in all extremities with 2+ DTRs and 2+ pulses.  Lymph: No lymphadenopathy of posterior or anterior cervical chain or axillae bilaterally.  Gait normal with good balance and coordination.  MSK:  Non tender with full range of motion and good stability and symmetric strength and tone of shoulders, elbows, wrist, hip, knee and ankles bilaterally.  Neck exam has some loss of lordosis, mild positive straight leg test right side.  Radicular symptoms in the S1 distribution in L5 distribution laterally.  Tightness with Corky Sox test.  Osteopathic findings  T9 extended rotated and side bent left L2 flexed rotated and side bent right Sacrum right on right     Impression and Recommendations:     This case required medical decision making of moderate complexity. The above documentation has been reviewed and is accurate and complete Lyndal Pulley, DO       Note: This dictation was prepared with Dragon dictation along with smaller phrase technology. Any transcriptional errors that result from this process are unintentional.

## 2019-05-15 NOTE — Assessment & Plan Note (Signed)
Patient continues to have intermittent radicular symptoms and is always with some type of pain.  Able to do a lot of different daily activities including yoga 2 times a week but unfortunately patient cannot increase activity.  Because of this patient would like to consider the possibility of a repeat MRI because she would consider surgical intervention.  I think this is definitely reasonable with patient having pain intermittently for greater than 5 years.  Patient will have advanced imaging we will further evaluate.  Patient is following up with her neurosurgeon in the near future as well.

## 2019-06-02 MED FILL — tiZANidine HCL 4 MG TABS: 4 | 90 days supply | Qty: 90 | Fill #1

## 2019-06-16 ENCOUNTER — Other Ambulatory Visit: Payer: Self-pay

## 2019-06-16 DIAGNOSIS — Z20822 Contact with and (suspected) exposure to covid-19: Secondary | ICD-10-CM

## 2019-06-16 DIAGNOSIS — R6889 Other general symptoms and signs: Secondary | ICD-10-CM | POA: Diagnosis not present

## 2019-06-18 ENCOUNTER — Encounter: Payer: Self-pay | Admitting: Family Medicine

## 2019-06-18 LAB — NOVEL CORONAVIRUS, NAA: SARS-CoV-2, NAA: NOT DETECTED

## 2019-06-19 MED FILL — VENLAFAXINE HCL ER 75 MG CA: 75 | 90 days supply | Qty: 90 | Fill #1

## 2019-06-30 ENCOUNTER — Ambulatory Visit (INDEPENDENT_AMBULATORY_CARE_PROVIDER_SITE_OTHER): Payer: 59 | Admitting: Family Medicine

## 2019-06-30 ENCOUNTER — Other Ambulatory Visit: Payer: Self-pay

## 2019-06-30 ENCOUNTER — Encounter: Payer: Self-pay | Admitting: Family Medicine

## 2019-06-30 VITALS — BP 118/72 | HR 72 | Ht 68.0 in | Wt 189.0 lb

## 2019-06-30 DIAGNOSIS — M999 Biomechanical lesion, unspecified: Secondary | ICD-10-CM | POA: Diagnosis not present

## 2019-06-30 DIAGNOSIS — M545 Low back pain, unspecified: Secondary | ICD-10-CM

## 2019-06-30 DIAGNOSIS — M5416 Radiculopathy, lumbar region: Secondary | ICD-10-CM | POA: Diagnosis not present

## 2019-06-30 NOTE — Assessment & Plan Note (Signed)
Decision today to treat with OMT was based on Physical Exam  After verbal consent patient was treated with HVLA, ME, FPR techniques in , thoracic, lumbar and sacral areas  Patient tolerated the procedure well with improvement in symptoms  Patient given exercises, stretches and lifestyle modifications  See medications in patient instructions if given  Patient will follow up in 4-8 weeks 

## 2019-06-30 NOTE — Patient Instructions (Addendum)
[facility] °--[phone] °

## 2019-06-30 NOTE — Assessment & Plan Note (Signed)
Continue to have radicular symptoms.  Previous MRI has shown nerve impingement.  We discussed repeating the MRI but patient declined with her wanting to see neurosurgery first.  Patient has been referred again and hopefully will discussed the possibility of surgical intervention with patient failing all conservative therapy including epidurals that are only short-term for follow-up again in 3 to 4 weeks to continue with some of the symptoms with osteopathic manipulation

## 2019-06-30 NOTE — [facility] Notes (Signed)
[name] [place], [place] [address] Phone: [phone] Subjective:   Shelby [name], am serving as a scribe for Dr. Hulan Saas.   CC: Back pain follow-up  05/15/2019 Patient continues to have intermittent radicular symptoms and is always with some type of pain.  Able to do a lot of different daily activities including yoga 2 times a week but unfortunately patient cannot increase activity.  Because of this patient would like to consider the possibility of a repeat MRI because she would consider surgical intervention.  I think this is definitely reasonable with patient having pain intermittently for greater than 5 years.  Patient will have advanced imaging we will further evaluate.  Patient is following up with her neurosurgeon in the near future as well.  Update 06/30/2019 [id number]  Shelby [name] is a 43 y.o. female coming in with complaint of back pain. [name] change since last visit. Continued pain. Has not been able to get into contact with neurosurgery.  Patient is having worsening pain at this time and is just done with it.  Does not want to have any other modalities and feels like things surgical intervention will be necessary at this time.  Was able to be in a tennis match still. Past Medical History:  Diagnosis Date  . Anxiety   . GERD (gastroesophageal reflux disease)    Past Surgical History:  Procedure Laterality Date  . Cyst remove  12/2009  . INTRAUTERINE DEVICE INSERTION  12/2009   Social History   Socioeconomic History  . Marital status: Married    Spouse name: Not on file  . Number of children: Not on file  . Years of education: Not on file  . Highest education level: Not on file  Occupational History  . Not on file  Social Needs  . Financial resource strain: Not on file  . Food insecurity    Worry: Not on file    Inability: Not on file  . Transportation needs    Medical: Not on file    Non-medical: Not on file   Tobacco Use  . Smoking status: Never Smoker  . Smokeless tobacco: Never Used  . 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 Alcohol use: Yes  . Drug use: [name]  . Sexual activity: Not on file  Lifestyle  . Physical activity    Days per week: Not on file    Minutes per session: Not on file  . Stress: Not on file  Relationships  . Social Herbalist on phone: Not on file    Gets together: Not on file    Attends religious service: Not on file    Active member of club or organization: Not on file    Attends meetings of clubs or organizations: Not on file    Relationship status: Not on file  Other Topics Concern  . Not on file  Social History Narrative  . Not on file   [name] Known Allergies Family History  Problem Relation Age of Onset  . Hyperlipidemia Mother   . Hypertension Mother   . Depression Mother   . Hypertension Father   . Bipolar disorder Sister        x 2    Current Outpatient Medications (Endocrine & Metabolic):  .  levonorgestrel (MIRENA) 20 MCG/24HR IUD, 1 Intra Uterine Device (  1 each total) by Intrauterine route once.    Current Outpatient Medications (Analgesics):  .  traMADol (ULTRAM) 50 MG tablet, Take 1 tablet (50 mg total) by mouth every 12 (twelve) hours as needed.   Current Outpatient Medications (Other):  .  gabapentin (NEURONTIN) 100 MG capsule, Take 2 capsules (200 mg total) by mouth at bedtime. .  potassium chloride SA (K-DUR,KLOR-CON) 20 MEQ tablet, Take 0.5 tablets (10 mEq total) by mouth daily. Marland Kitchen  tiZANidine (ZANAFLEX) 4 MG tablet, Take 1 tablet (4 mg total) by mouth Nightly. .  venlafaxine XR (EFFEXOR XR) 75 MG 24 hr capsule, Take 1 capsule (75 mg total) by mouth daily with breakfast. .  Vitamin D, Ergocalciferol, (DRISDOL) 1.25 MG (50000 UT) CAPS capsule, Take 1 capsule (50,000 Units total) by mouth every 7 (seven) days.    Past  medical history, social, surgical and family history all reviewed in electronic medical record.  [name] information unless stated regarding to the chief complaint.   Review of Systems:  [name] headache, visual changes, nausea, vomiting, diarrhea, constipation, dizziness, abdominal pain, skin rash, fevers, chills, night sweats, weight loss, swollen lymph nodes, , chest pain, shortness of breath, mood changes.  Positive muscle aches and body aches  Objective  Blood pressure 118/72, pulse 72, height 5\' 8"  (1.727 m), weight 189 lb (85.7 kg).    General: [name] apparent distress alert and oriented x3 mood and affect normal, dressed appropriately.  HEENT: Pupils equal, extraocular movements intact  Respiratory: Patient's speak in full sentences and does not appear short of breath  Cardiovascular: [name] lower extremity edema, non tender, [name] erythema  Skin: Warm dry intact with [name] signs of infection or rash on extremities or on axial skeleton.  Abdomen: Soft nontender  Neuro: Cranial nerves II through XII are intact, neurovascularly intact in all extremities with 2+ DTRs and 2+ pulses.  Lymph: [name] lymphadenopathy of posterior or anterior cervical chain or axillae bilaterally.  Gait mild antalgic MSK:  tender with full range of motion and good stability and symmetric strength and tone of shoulders, elbows, wrist, hip, knee and ankles bilaterally.  Back exam tender to palpation in the paraspinal musculature lumbar spine right greater than left.  Positive straight leg test on the right side at 25 degrees of forward flexion.  Mild limitation and sidebending bilaterally.  Positive Corky Sox on the right  Osteopathic findings   T5 extended rotated and side bent left L1 flexed rotated and side bent right Sacrum right on right     Impression and Recommendations:     This case required medical decision making of moderate complexity. The above documentation has been reviewed and is accurate and complete  Lyndal Pulley, DO       Note: This dictation was prepared with Dragon dictation along with smaller phrase technology. Any transcriptional errors that result from this process are unintentional.

## 2019-07-06 ENCOUNTER — Encounter: Payer: Self-pay | Admitting: Family Medicine

## 2019-07-07 ENCOUNTER — Other Ambulatory Visit: Payer: Self-pay

## 2019-07-07 DIAGNOSIS — M5416 Radiculopathy, lumbar region: Secondary | ICD-10-CM

## 2019-07-07 DIAGNOSIS — Z20822 Contact with and (suspected) exposure to covid-19: Secondary | ICD-10-CM

## 2019-07-07 NOTE — [facility] Notes (Signed)
[id number] 

## 2019-07-08 LAB — NOVEL CORONAVIRUS, NAA: SARS-CoV-2, NAA: NOT DETECTED

## 2019-07-09 ENCOUNTER — Other Ambulatory Visit: Payer: Self-pay

## 2019-07-09 ENCOUNTER — Ambulatory Visit
Admission: RE | Admit: 2019-07-09 | Discharge: 2019-07-09 | Disposition: A | Discharge status: Home / Self Care | Payer: 59 | Source: Ambulatory Visit | Attending: Family Medicine | Admitting: Family Medicine

## 2019-07-09 DIAGNOSIS — M5416 Radiculopathy, lumbar region: Secondary | ICD-10-CM

## 2019-07-09 DIAGNOSIS — M545 Low back pain: Secondary | ICD-10-CM | POA: Diagnosis not present

## 2019-07-09 MED ORDER — IOPAMIDOL (ISOVUE-M 200) INJECTION 41%
1.0000 mL | Freq: Once | INTRAMUSCULAR | Status: AC
  Administered 2019-07-09: 11:00:00 1 mL via EPIDURAL

## 2019-07-09 MED ORDER — METHYLPREDNISOLONE ACETATE 40 MG/ML INJ SUSP (RADIOLOG
120.0000 mg | Freq: Once | INTRAMUSCULAR | Status: AC
  Administered 2019-07-09: 11:00:00 120 mg via EPIDURAL

## 2019-07-09 NOTE — Discharge Instructions (Signed)

## 2019-07-14 ENCOUNTER — Encounter: Payer: Self-pay | Admitting: Family Medicine

## 2019-07-16 DIAGNOSIS — M5136 Other intervertebral disc degeneration, lumbar region: Secondary | ICD-10-CM | POA: Diagnosis not present

## 2019-07-16 DIAGNOSIS — M5416 Radiculopathy, lumbar region: Secondary | ICD-10-CM | POA: Diagnosis not present

## 2019-07-16 DIAGNOSIS — M5137 Other intervertebral disc degeneration, lumbosacral region: Secondary | ICD-10-CM | POA: Diagnosis not present

## 2019-07-16 DIAGNOSIS — M545 Low back pain: Secondary | ICD-10-CM | POA: Diagnosis not present

## 2019-07-17 ENCOUNTER — Ambulatory Visit: Payer: 59 | Admitting: Family Medicine

## 2019-07-21 ENCOUNTER — Other Ambulatory Visit (HOSPITAL_COMMUNITY): Payer: Self-pay | Admitting: Neurosurgery

## 2019-07-21 ENCOUNTER — Other Ambulatory Visit: Payer: Self-pay | Admitting: Neurosurgery

## 2019-07-21 DIAGNOSIS — M5416 Radiculopathy, lumbar region: Secondary | ICD-10-CM

## 2019-07-28 ENCOUNTER — Other Ambulatory Visit: Payer: Self-pay

## 2019-07-28 ENCOUNTER — Ambulatory Visit (HOSPITAL_COMMUNITY)
Admission: RE | Admit: 2019-07-28 | Discharge: 2019-07-28 | Disposition: A | Discharge status: Home / Self Care | Payer: 59 | Source: Ambulatory Visit | Attending: Neurosurgery | Admitting: Neurosurgery

## 2019-07-28 DIAGNOSIS — M545 Low back pain: Secondary | ICD-10-CM | POA: Diagnosis not present

## 2019-07-28 DIAGNOSIS — M5416 Radiculopathy, lumbar region: Secondary | ICD-10-CM | POA: Diagnosis not present

## 2019-08-05 DIAGNOSIS — Z6829 Body mass index (BMI) 29.0-29.9, adult: Secondary | ICD-10-CM | POA: Diagnosis not present

## 2019-08-05 DIAGNOSIS — M5137 Other intervertebral disc degeneration, lumbosacral region: Secondary | ICD-10-CM | POA: Diagnosis not present

## 2019-08-05 DIAGNOSIS — M5416 Radiculopathy, lumbar region: Secondary | ICD-10-CM | POA: Diagnosis not present

## 2019-08-05 DIAGNOSIS — R030 Elevated blood-pressure reading, without diagnosis of hypertension: Secondary | ICD-10-CM | POA: Diagnosis not present

## 2019-08-05 DIAGNOSIS — M545 Low back pain: Secondary | ICD-10-CM | POA: Diagnosis not present

## 2019-08-05 DIAGNOSIS — M5136 Other intervertebral disc degeneration, lumbar region: Secondary | ICD-10-CM | POA: Diagnosis not present

## 2019-08-26 DIAGNOSIS — R52 Pain, unspecified: Secondary | ICD-10-CM | POA: Diagnosis not present

## 2019-08-26 DIAGNOSIS — Z20828 Contact with and (suspected) exposure to other viral communicable diseases: Secondary | ICD-10-CM | POA: Diagnosis not present

## 2019-08-26 DIAGNOSIS — R05 Cough: Secondary | ICD-10-CM | POA: Diagnosis not present

## 2019-09-07 DIAGNOSIS — M545 Low back pain: Secondary | ICD-10-CM | POA: Diagnosis not present

## 2019-09-10 ENCOUNTER — Encounter: Payer: Self-pay | Admitting: Family Medicine

## 2019-09-11 ENCOUNTER — Other Ambulatory Visit: Payer: Self-pay

## 2019-09-11 MED ORDER — TIZANIDINE HCL 4 MG PO TABS: 4.0000 mg | ORAL_TABLET | Freq: Every evening | ORAL | 1 refills | Status: DC

## 2019-09-11 MED FILL — tiZANidine HCL 4 MG TABS: 4 | 90 days supply | Qty: 90 | Fill #0

## 2019-09-14 DIAGNOSIS — M545 Low back pain: Secondary | ICD-10-CM | POA: Diagnosis not present

## 2019-09-15 ENCOUNTER — Ambulatory Visit (INDEPENDENT_AMBULATORY_CARE_PROVIDER_SITE_OTHER): Payer: 59 | Admitting: Family Medicine

## 2019-09-15 ENCOUNTER — Encounter: Payer: Self-pay | Admitting: Family Medicine

## 2019-09-15 ENCOUNTER — Other Ambulatory Visit: Payer: Self-pay

## 2019-09-15 VITALS — BP 110/78 | HR 81 | Ht 68.0 in | Wt 189.0 lb

## 2019-09-15 DIAGNOSIS — M999 Biomechanical lesion, unspecified: Secondary | ICD-10-CM

## 2019-09-15 DIAGNOSIS — M5416 Radiculopathy, lumbar region: Secondary | ICD-10-CM | POA: Diagnosis not present

## 2019-09-15 NOTE — [facility] Notes (Signed)
[name] [name], [name] [address] Phone: [phone] Subjective:   Shelby [name], am serving as a scribe for Dr. Hulan Saas.  This visit occurred during the SARS-CoV-2 public health emergency.  Safety protocols were in place, including screening questions prior to the visit, additional usage of staff PPE, and extensive cleaning of exam room while observing appropriate contact time as indicated for disinfecting solutions.    CC: Low back pain  [id number]   06/30/2019 OMT  Update 09/15/2019 Shelby [name] is a 43 y.o. female coming in with complaint of back pain. Patient is here for OMT to manage her back pain. Patient states that she has been doing PT which is helping her back. Surgeon told her that she was not a candidate for surgery at this time.  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  Patient at this point is wanting to continue with conservative therapy and they stated not a surgical problem.     Past Medical History:  Diagnosis Date  . Anxiety   . GERD (gastroesophageal reflux disease)    Past Surgical History:  Procedure Laterality Date  . Cyst remove  12/2009  . INTRAUTERINE DEVICE INSERTION  12/2009   Social History   Socioeconomic History  . Marital status: Married    Spouse name: Not on file  . Number of children: Not on file  . Years of education: Not on file  . Highest education level: Not on file  Occupational History  . Not on file  Social Needs  . Financial resource strain: Not on file  . Food insecurity    Worry: Not on file    Inability: Not on file  . Transportation needs    Medical: Not on file    Non-medical: Not on file  Tobacco Use  . Smoking status: Never Smoker  . Smokeless tobacco: Never Used  . 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 Alcohol use: Yes  . Drug use: [name]  . Sexual activity: Not on file  Lifestyle  . Physical activity    Days per week: Not on file    Minutes per session: Not on file  . Stress: Not on file  Relationships  . Social Herbalist on phone: Not on file    Gets together: Not on file    Attends religious service: Not on file    Active member of club or organization: Not on file    Attends meetings of clubs or organizations: Not on file    Relationship status: Not on file  Other Topics Concern  . Not on file  Social History Narrative  . Not on file   [name] Known Allergies Family History  Problem Relation Age of Onset  . Hyperlipidemia Mother   . Hypertension Mother   . Depression Mother   . Hypertension Father   . Bipolar disorder Sister        x 2    Current Outpatient Medications (Endocrine & Metabolic):  .  levonorgestrel (MIRENA) 20 MCG/24HR IUD, 1 Intra Uterine Device (1 each total) by Intrauterine route once.    Current Outpatient Medications (Analgesics):  .  traMADol (ULTRAM) 50 MG tablet, Take 1 tablet (50 mg total) by mouth every 12 (twelve) hours  as needed.   Current Outpatient Medications (Other):  .  gabapentin (NEURONTIN) 100 MG capsule, Take 2 capsules (200 mg total) by mouth at bedtime. .  potassium chloride SA (K-DUR,KLOR-CON) 20 MEQ tablet, Take 0.5 tablets (10 mEq total) by mouth daily. Marland Kitchen  tiZANidine (ZANAFLEX) 4 MG tablet, Take 1 tablet (4 mg total) by mouth Nightly. .  venlafaxine XR (EFFEXOR XR) 75 MG 24 hr capsule, Take 1 capsule (75 mg total) by mouth daily with breakfast. .  Vitamin D, Ergocalciferol, (DRISDOL) 1.25 MG (50000 UT) CAPS capsule, Take 1 capsule (50,000 Units total) by mouth every 7 (seven) days.    Past medical history, social, surgical and family history all reviewed in electronic medical record.  [name] information unless stated regarding to the chief complaint.   Review of Systems:  [name]  headache, visual changes, nausea, vomiting, diarrhea, constipation, dizziness, abdominal pain, skin rash, fevers, chills, night sweats, weight loss, swollen lymph nodes, body aches, joint swelling,  chest pain, shortness of breath, mood changes.  Positive muscle aches  Objective  Blood pressure 110/78, pulse 81, height 5\' 8"  (1.727 m), weight 189 lb (85.7 kg), SpO2 98 %.    General: [name] apparent distress alert and oriented x3 mood and affect normal, dressed appropriately.  HEENT: Pupils equal, extraocular movements intact  Respiratory: Patient's speak in full sentences and does not appear short of breath  Cardiovascular: [name] lower extremity edema, non tender, [name] erythema  Skin: Warm dry intact with [name] signs of infection or rash on extremities or on axial skeleton.  Abdomen: Soft nontender  Neuro: Cranial nerves II through XII are intact, neurovascularly intact in all extremities with 2+ DTRs and 2+ pulses.  Lymph: [name] lymphadenopathy of posterior or anterior cervical chain or axillae bilaterally.  Gait normal with good balance and coordination.  MSK:  Non tender with full range of motion and good stability and symmetric strength and tone of shoulders, elbows, wrist, hip, knee and ankles bilaterally.  Back exam shows the patient does have mild loss of lordosis.  Patient still has significant tightness with straight leg test but [name] true radicular symptoms.  Tightness with Corky Sox test bilaterally.  Tender to palpation over the sacroiliac joints bilaterally.  Osteopathic findings  T7 extended rotated and side bent left L2 flexed rotated and side bent right Sacrum right on right    Impression and Recommendations:     This case required medical decision making of moderate complexity. The above documentation has been reviewed and is accurate and complete Lyndal Pulley, DO       Note: This dictation was prepared with Dragon dictation along with smaller phrase technology. Any transcriptional  errors that result from this process are unintentional.

## 2019-09-15 NOTE — Assessment & Plan Note (Signed)
Patient is seeing 2 different surgeons at this point and then have now elected that patient will do conservative therapy.  Has started with osteopathic manipulation again and hopefully this will be beneficial.  Discussed with patient about posture and ergonomics, patient will continue formal physical therapy that she is getting some relief with as well.  Patient will follow up with me again in 4 to 8 weeks

## 2019-09-15 NOTE — Patient Instructions (Signed)
  [address], [address]st floor [place], [place] [address] Phone [phone]

## 2019-09-15 NOTE — Assessment & Plan Note (Signed)
Decision today to treat with OMT was based on Physical Exam  After verbal consent patient was treated with HVLA, ME, FPR techniques in  thoracic, lumbar and sacral areas  Patient tolerated the procedure well with improvement in symptoms  Patient given exercises, stretches and lifestyle modifications  See medications in patient instructions if given  Patient will follow up in 4-8 weeks 

## 2019-09-21 DIAGNOSIS — M545 Low back pain: Secondary | ICD-10-CM | POA: Diagnosis not present

## 2019-09-25 DIAGNOSIS — M545 Low back pain: Secondary | ICD-10-CM | POA: Diagnosis not present

## 2019-09-30 DIAGNOSIS — Z1159 Encounter for screening for other viral diseases: Secondary | ICD-10-CM | POA: Diagnosis not present

## 2019-10-16 ENCOUNTER — Ambulatory Visit: Payer: 59 | Attending: Internal Medicine

## 2019-10-16 ENCOUNTER — Other Ambulatory Visit: Payer: 59

## 2019-10-16 DIAGNOSIS — Z20822 Contact with and (suspected) exposure to covid-19: Secondary | ICD-10-CM

## 2019-10-18 LAB — NOVEL CORONAVIRUS, NAA: SARS-CoV-2, NAA: NOT DETECTED

## 2019-10-27 ENCOUNTER — Encounter: Payer: Self-pay | Admitting: Family Medicine

## 2019-10-27 ENCOUNTER — Ambulatory Visit: Payer: 59 | Admitting: Family Medicine

## 2019-10-27 ENCOUNTER — Other Ambulatory Visit: Payer: Self-pay

## 2019-10-27 VITALS — BP 122/82 | HR 68 | Ht 68.0 in | Wt 188.0 lb

## 2019-10-27 DIAGNOSIS — M5416 Radiculopathy, lumbar region: Secondary | ICD-10-CM | POA: Diagnosis not present

## 2019-10-27 DIAGNOSIS — M999 Biomechanical lesion, unspecified: Secondary | ICD-10-CM

## 2019-10-27 MED ORDER — VENLAFAXINE HCL ER 37.5 MG PO CP24: 37.5000 mg | ORAL_CAPSULE | Freq: Every day | ORAL | 0 refills | Status: DC

## 2019-10-27 MED FILL — VENLAFAXINE HCL ER 37.5 MG: 37.5 | 30 days supply | Qty: 30 | Fill #0

## 2019-10-27 NOTE — Assessment & Plan Note (Signed)
Decision today to treat with OMT was based on Physical Exam  After verbal consent patient was treated with HVLA, ME, FPR techniques in  thoracic, lumbar and sacral areas  Patient tolerated the procedure well with improvement in symptoms  Patient given exercises, stretches and lifestyle modifications  See medications in patient instructions if given  Patient will follow up in 4-8 weeks 

## 2019-10-27 NOTE — [facility] Notes (Signed)
[place] Phone: [phone] Subjective:   Shelby [name], am serving as a scribe for Dr. Hulan [name]. This visit occurred during the SARS-CoV-2 public health emergency.  Safety protocols were in place, including screening questions prior to the visit, additional usage of staff PPE, and extensive cleaning of exam room while observing appropriate contact time as indicated for disinfecting solutions.   I'm seeing this patient by the request  of:  Shelby Infante, MD  CC: Low back pain follow-up  [id number]  Shelby [name] is a 44 y.o. female coming in with complaint of back pain. Last seen on 09/15/2019 for OMT. Patient states that she has been feeling good.  Patient has been doing relatively well overall.  Discussed icing regimen which she has been doing.  Feels like medication was very helpful.       Past Medical History:  Diagnosis Date  . Anxiety   . GERD (gastroesophageal reflux disease)    Past Surgical History:  Procedure Laterality Date  . Cyst remove  12/2009  . INTRAUTERINE DEVICE INSERTION  12/2009   Social History   Socioeconomic History  . Marital status: Married    Spouse name: Not on file  . Number of children: Not on file  . Years of education: Not on file  . Highest education level: Not on file  Occupational History  . Not on file  Tobacco Use  . Smoking status: Never Smoker  . Smokeless tobacco: Never Used  . 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 Alcohol use: Yes  . Drug use: [name]  . Sexual activity: Not on file  Other Topics Concern  . Not on file  Social History Narrative  . Not on file   Social Determinants of Health   Financial Resource Strain:   . Difficulty of Paying Living Expenses: Not on file  Food Insecurity:   . Worried About Charity fundraiser in the Last Year:  Not on file  . Ran Out of Food in the Last Year: Not on file  Transportation Needs:   . Lack of Transportation (Medical): Not on file  . Lack of Transportation (Non-Medical): Not on file  Physical Activity:   . Days of Exercise per Week: Not on file  . Minutes of Exercise per Session: Not on file  Stress:   . Feeling of Stress : Not on file  Social Connections:   . Frequency of Communication with Friends and Family: Not on file  . Frequency of Social Gatherings with Friends and Family: Not on file  . Attends Religious Services: Not on file  . Active Member of Clubs or Organizations: Not on file  . Attends Archivist Meetings: Not on file  . Marital Status: Not on file   [name] Known Allergies Family History  Problem Relation Age of Onset  . Hyperlipidemia Mother   . Hypertension Mother   . Depression Mother   . Hypertension Father   . Bipolar disorder Sister        x 2    Current Outpatient Medications (Endocrine & Metabolic):  .  levonorgestrel (MIRENA) 20 MCG/24HR IUD, 1 Intra Uterine Device (1 each total) by Intrauterine route once.    Current Outpatient Medications (Analgesics):  .  traMADol (ULTRAM) 50 MG tablet, Take 1 tablet (50 mg total) by  mouth every 12 (twelve) hours as needed.   Current Outpatient Medications (Other):  .  gabapentin (NEURONTIN) 100 MG capsule, Take 2 capsules (200 mg total) by mouth at bedtime. .  potassium chloride SA (K-DUR,KLOR-CON) 20 MEQ tablet, Take 0.5 tablets (10 mEq total) by mouth daily. Marland Kitchen  tiZANidine (ZANAFLEX) 4 MG tablet, Take 1 tablet (4 mg total) by mouth Nightly. .  venlafaxine XR (EFFEXOR XR) 37.5 MG 24 hr capsule, Take 1 capsule (37.5 mg total) by mouth daily with breakfast. .  venlafaxine XR (EFFEXOR XR) 75 MG 24 hr capsule, Take 1 capsule (75 mg total) by mouth daily with breakfast. .  Vitamin D, Ergocalciferol, (DRISDOL) 1.25 MG (50000 UT) CAPS capsule, Take 1 capsule (50,000 Units total) by mouth every 7 (seven)  days.    Past medical history, social, surgical and family history all reviewed in electronic medical record.  [name] information unless stated regarding to the chief complaint.   Review of Systems:  [name] headache, visual changes, nausea, vomiting, diarrhea, constipation, dizziness, abdominal pain, skin rash, fevers, chills, night sweats, weight loss, swollen lymph nodes, body aches, joint swelling, chest pain, shortness of breath, mood changes. POSITIVE muscle aches  Objective  Blood pressure 122/82, pulse 68, height 5\' 8"  (1.727 m), weight 188 lb (85.3 kg), SpO2 98 %.   General: [name] apparent distress alert and oriented x3 mood and affect normal, dressed appropriately.  HEENT: Pupils equal, extraocular movements intact  Respiratory: Patient's speak in full sentences and does not appear short of breath  Cardiovascular: [name] lower extremity edema, non tender, [name] erythema  Skin: Warm dry intact with [name] signs of infection or rash on extremities or on axial skeleton.  Abdomen: Soft nontender  Neuro: Cranial nerves II through XII are intact, neurovascularly intact in all extremities with 2+ DTRs and 2+ pulses.  Lymph: [name] lymphadenopathy of posterior or anterior cervical chain or axillae bilaterally.  Gait normal with good balance and coordination.  MSK:  Non tender with full range of motion and good stability and symmetric strength and tone of shoulders, elbows, wrist, hip, knee and ankles bilaterally.  Low back exam does have some loss of lordosis.  Some tender to palpation in the paraspinal musculature lumbar spine right greater than left.  Some mild positive FABER test noted.  Straight leg test noted today.  Osteopathic findings T5 extended rotated and side bent left L2 flexed rotated and side bent right Sacrum right on right    Impression and Recommendations:     This case required medical decision making of moderate complexity. The above documentation has been reviewed and is  accurate and complete Shelby Pulley, DO       Note: This dictation was prepared with Dragon dictation along with smaller phrase technology. Any transcriptional errors that result from this process are unintentional.

## 2019-10-27 NOTE — Assessment & Plan Note (Signed)
Patient is doing relatively well at this point.  Patient continues to use the gabapentin intermittently and the Effexor daily.  Discussed which activities to do which wants to avoid.  Discussed home exercises.  Follow-up again in 6 to 8 weeks.

## 2019-10-27 NOTE — Patient Instructions (Signed)
Keep doing yoga Refilled Effexor See me again in 6-8 weeks

## 2019-11-10 ENCOUNTER — Ambulatory Visit: Payer: 59 | Attending: Internal Medicine

## 2019-11-10 ENCOUNTER — Encounter: Payer: Self-pay | Admitting: Family Medicine

## 2019-11-10 DIAGNOSIS — Z20822 Contact with and (suspected) exposure to covid-19: Secondary | ICD-10-CM | POA: Diagnosis not present

## 2019-11-10 MED FILL — VENLAFAXINE HCL ER 37.5 MG: 37.5 | 30 days supply | Qty: 30 | Fill #0

## 2019-11-11 ENCOUNTER — Other Ambulatory Visit: Payer: Self-pay

## 2019-11-11 LAB — NOVEL CORONAVIRUS, NAA: SARS-CoV-2, NAA: NOT DETECTED

## 2019-11-11 MED ORDER — VENLAFAXINE HCL ER 75 MG PO CP24: 75.0000 mg | ORAL_CAPSULE | Freq: Every day | ORAL | 1 refills | Status: DC

## 2019-11-11 MED FILL — VENLAFAXINE HCL ER 75 MG CA: 75 | 90 days supply | Qty: 90 | Fill #0

## 2019-11-30 ENCOUNTER — Encounter: Payer: Self-pay | Admitting: Family Medicine

## 2019-12-02 ENCOUNTER — Ambulatory Visit: Payer: 59 | Admitting: Family Medicine

## 2019-12-27 ENCOUNTER — Encounter: Payer: Self-pay | Admitting: Family Medicine

## 2019-12-28 NOTE — [facility] Notes (Signed)
[place] [address] [place] Phone: [phone] Subjective:   I Shelby [name] am serving as a Education administrator for Dr. Hulan Saas.  This visit occurred during the SARS-CoV-2 public health emergency.  Safety protocols were in place, including screening questions prior to the visit, additional usage of staff PPE, and extensive cleaning of exam room while observing appropriate contact time as indicated for disinfecting solutions.   I'm seeing this patient by the request  of:  Crist Infante, MD  CC: Low back pain follow-up  [id number]  Shelby [name] is a 44 y.o. female coming in with complaint of back pain. Last seen on 10/27/2019 for OMT. Patient states she is doing well. Hips feel out.  Patient does have a nerve impingement and continues to work on a conservative therapy.  Patient is still trying to avoid surgery and patient's neurosurgeon agrees with this.  We did review patient's previous imaging.  Doing relatively well with the medications at the moment as well.  Patient is able to play tennis 4 times a week as well as yoga 4 times a week      Past Medical History:  Diagnosis Date  . Anxiety   . GERD (gastroesophageal reflux disease)    Past Surgical History:  Procedure Laterality Date  . Cyst remove  12/2009  . INTRAUTERINE DEVICE INSERTION  12/2009   Social History   Socioeconomic History  . Marital status: Married    Spouse name: Not on file  . Number of children: Not on file  . Years of education: Not on file  . Highest education level: Not on file  Occupational History  . Not on file  Tobacco Use  . Smoking status: Never Smoker  . Smokeless tobacco: Never Used  . 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 Alcohol use: Yes  . Drug use: No  . Sexual activity: Not on file  Other Topics Concern  . Not on file  Social  History Narrative  . Not on file   Social Determinants of Health   Financial Resource Strain:   . Difficulty of Paying Living Expenses:   Food Insecurity:   . Worried About Charity fundraiser in the Last Year:   . Arboriculturist in the Last Year:   Transportation Needs:   . Film/video editor (Medical):   Marland Kitchen Lack of Transportation (Non-Medical):   Physical Activity:   . Days of Exercise per Week:   . Minutes of Exercise per Session:   Stress:   . Feeling of Stress :   Social Connections:   . Frequency of Communication with Friends and Family:   . Frequency of Social Gatherings with Friends and Family:   . Attends Religious Services:   . Active Member of Clubs or Organizations:   . Attends Archivist Meetings:   Marland Kitchen Marital Status:    No Known Allergies Family History  Problem Relation Age of Onset  . Hyperlipidemia Mother   . Hypertension Mother   . Depression Mother   . Hypertension Father   . Bipolar disorder Sister        x 2    Current Outpatient Medications (Endocrine & Metabolic):  .  levonorgestrel (MIRENA) 20 MCG/24HR IUD, 1 Intra Uterine Device (1 each total) by Intrauterine route once.    Current Outpatient Medications (  Analgesics):  .  traMADol (ULTRAM) 50 MG tablet, Take 1 tablet (50 mg total) by mouth every 12 (twelve) hours as needed.   Current Outpatient Medications (Other):  .  gabapentin (NEURONTIN) 100 MG capsule, Take 2 capsules (200 mg total) by mouth at bedtime. .  potassium chloride SA (K-DUR,KLOR-CON) 20 MEQ tablet, Take 0.5 tablets (10 mEq total) by mouth daily. Marland Kitchen  tiZANidine (ZANAFLEX) 4 MG tablet, Take 1 tablet (4 mg total) by mouth Nightly. .  venlafaxine XR (EFFEXOR XR) 37.5 MG 24 hr capsule, Take 1 capsule (37.5 mg total) by mouth daily with breakfast. .  venlafaxine XR (EFFEXOR XR) 75 MG 24 hr capsule, Take 1 capsule (75 mg total) by mouth daily with breakfast. .  Vitamin D, Ergocalciferol, (DRISDOL) 1.25 MG (50000 UT)  CAPS capsule, Take 1 capsule (50,000 Units total) by mouth every 7 (seven) days.   Reviewed prior external information including notes and imaging from  primary care provider As well as notes that were available from care everywhere and other healthcare systems.  Past medical history, social, surgical and family history all reviewed in electronic medical record.  No pertanent information unless stated regarding to the chief complaint.   Review of Systems:  No headache, visual changes, nausea, vomiting, diarrhea, constipation, dizziness, abdominal pain, skin rash, fevers, chills, night sweats, weight loss, swollen lymph nodes, body aches, joint swelling, chest pain, shortness of breath, mood changes. POSITIVE muscle aches  Objective  Blood pressure 100/80, pulse (!) 59, height 5\' 8"  (1.727 m), weight 184 lb (83.5 kg), SpO2 98 %.   General: No apparent distress alert and oriented x3 mood and affect normal, dressed appropriately.  HEENT: Pupils equal, extraocular movements intact  Respiratory: Patient's speak in full sentences and does not appear short of breath  Cardiovascular: No lower extremity edema, non tender, no erythema  Neuro: Cranial nerves II through XII are intact, neurovascularly intact in all extremities with 2+ DTRs and 2+ pulses.  Gait normal with good balance and coordination.  MSK:  Non tender with full range of motion and good stability and symmetric strength and tone of shoulders, elbows, wrist, hip, knee and ankles bilaterally.  Back Exam:  Inspection: Mild loss of lordosis Motion: Flexion 45 deg, Extension 25 deg, Side Bending to 45 deg bilaterally,  Rotation to 45 deg bilaterally  SLR laying: Mild positive right XSLR laying: Negative  Palpable tenderness: Tender to palpation of paraspinal musculature lumbar spine right greater than left. FABER: Tightness bilaterally. Sensory change: Gross sensation intact to all lumbar and sacral dermatomes.  Reflexes: 2+ at both  patellar tendons, 2+ at achilles tendons, Babinski's downgoing.  Strength at foot  Plantar-flexion: 5/5 Dorsi-flexion: 5/5 Eversion: 5/5 Inversion: 5/5  Leg strength  Quad: 5/5 Hamstring: 5/5 Hip flexor: 5/5 Hip abductors: 5/5  Gait unremarkable.   Osteopathic findings  C6 flexed rotated and side bent left T9 extended rotated and side bent left L2 flexed rotated and side bent right Sacrum right on right    Impression and Recommendations:     This case required medical decision making of moderate complexity. The above documentation has been reviewed and is accurate and complete Lyndal Pulley, DO       Note: This dictation was prepared with Dragon dictation along with smaller phrase technology. Any transcriptional errors that result from this process are unintentional.

## 2019-12-29 ENCOUNTER — Encounter: Payer: Self-pay | Admitting: Family Medicine

## 2019-12-29 ENCOUNTER — Other Ambulatory Visit: Payer: Self-pay

## 2019-12-29 ENCOUNTER — Ambulatory Visit: Payer: 59 | Admitting: Family Medicine

## 2019-12-29 VITALS — BP 100/80 | HR 59 | Ht 68.0 in | Wt 184.0 lb

## 2019-12-29 DIAGNOSIS — M533 Sacrococcygeal disorders, not elsewhere classified: Secondary | ICD-10-CM

## 2019-12-29 DIAGNOSIS — M999 Biomechanical lesion, unspecified: Secondary | ICD-10-CM | POA: Diagnosis not present

## 2019-12-29 MED FILL — tiZANidine HCL 4 MG TABS: 4 | 90 days supply | Qty: 90 | Fill #1

## 2019-12-29 NOTE — Assessment & Plan Note (Signed)
Chronic problem : Mild exacerbation  interventions previously, including medication management: Multiple different medications Effexor seems to be the most helpful as well as the gabapentin and Zanaflex.  Discussed the use and refills were appropriate   Interventions this visit: Osteopathic manipulations and discussed medical management and medication management We discussed with patient the importance ergonomics, home exercises, icing regimen, and over-the-counter natural products.   Future considerations but will be based on evaluation and next visit: Continued manipulation is a possibility    Return to clinic: 5 to 6 weeks

## 2019-12-29 NOTE — Patient Instructions (Signed)
Keep it up core is key See me again in 6 weeks

## 2019-12-29 NOTE — Assessment & Plan Note (Signed)

## 2019-12-30 DIAGNOSIS — M545 Low back pain: Secondary | ICD-10-CM | POA: Diagnosis not present

## 2020-01-13 DIAGNOSIS — M545 Low back pain: Secondary | ICD-10-CM | POA: Diagnosis not present

## 2020-01-27 DIAGNOSIS — M545 Low back pain: Secondary | ICD-10-CM | POA: Diagnosis not present

## 2020-02-15 DIAGNOSIS — M545 Low back pain: Secondary | ICD-10-CM | POA: Diagnosis not present

## 2020-02-16 ENCOUNTER — Ambulatory Visit (INDEPENDENT_AMBULATORY_CARE_PROVIDER_SITE_OTHER): Payer: 59 | Admitting: Family Medicine

## 2020-02-16 ENCOUNTER — Other Ambulatory Visit: Payer: Self-pay

## 2020-02-16 ENCOUNTER — Encounter: Payer: Self-pay | Admitting: Family Medicine

## 2020-02-16 DIAGNOSIS — M5416 Radiculopathy, lumbar region: Secondary | ICD-10-CM | POA: Diagnosis not present

## 2020-02-16 DIAGNOSIS — M999 Biomechanical lesion, unspecified: Secondary | ICD-10-CM | POA: Diagnosis not present

## 2020-02-16 NOTE — Patient Instructions (Addendum)
Good to see you See me again in 6-7 weeks 

## 2020-02-16 NOTE — [facility] Notes (Signed)
[facility] [address] [place] Phone: [phone] Subjective:   I [name] am serving as a Education administrator for Dr. Hulan Saas.  This visit occurred during the SARS-CoV-2 public health emergency.  Safety protocols were in place, including screening questions prior to the visit, additional usage of staff PPE, and extensive cleaning of exam room while observing appropriate contact time as indicated for disinfecting solutions.   I'm seeing this patient by the request  of:  Crist Infante, MD  CC:   [id number]  Shelby [name] is a 44 y.o. female coming in with complaint of back pain. Last seen for OMT. Doing well today.  Patient has been planning tennis regularly as well as doing yoga.  Feels like this has been helpful.  The more activity she does the better her back seems to be.  Intermittent radicular symptoms down the leg but nothing severe.     Past Medical History:  Diagnosis Date  . Anxiety   . GERD (gastroesophageal reflux disease)    Past Surgical History:  Procedure Laterality Date  . Cyst remove  12/2009  . INTRAUTERINE DEVICE INSERTION  12/2009   Social History   Socioeconomic History  . Marital status: Married    Spouse name: Not on file  . Number of children: Not on file  . Years of education: Not on file  . Highest education level: Not on file  Occupational History  . Not on file  Tobacco Use  . Smoking status: Never Smoker  . Smokeless tobacco: Never Used  . 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 Alcohol use: Yes  . Drug use: No  . Sexual activity: Not on file  Other Topics Concern  . Not on file  Social History Narrative  . Not on file   Social Determinants of Health   Financial Resource Strain:   . Difficulty of Paying Living Expenses:   Food Insecurity:   . Worried About Charity fundraiser in the  Last Year:   . Arboriculturist in the Last Year:   Transportation Needs:   . Film/video editor (Medical):   Marland Kitchen Lack of Transportation (Non-Medical):   Physical Activity:   . Days of Exercise per Week:   . Minutes of Exercise per Session:   Stress:   . Feeling of Stress :   Social Connections:   . Frequency of Communication with Friends and Family:   . Frequency of Social Gatherings with Friends and Family:   . Attends Religious Services:   . Active Member of Clubs or Organizations:   . Attends Archivist Meetings:   Marland Kitchen Marital Status:    No Known Allergies Family History  Problem Relation Age of Onset  . Hyperlipidemia Mother   . Hypertension Mother   . Depression Mother   . Hypertension Father   . Bipolar disorder Sister        x 2    Current Outpatient Medications (Endocrine & Metabolic):  .  levonorgestrel (MIRENA) 20 MCG/24HR IUD, 1 Intra Uterine Device (1 each total) by Intrauterine route once.    Current Outpatient Medications (Analgesics):  .  traMADol (ULTRAM) 50 MG tablet, Take 1 tablet (50 mg total) by mouth every 12 (twelve) hours as needed.   Current Outpatient Medications (Other):  .  gabapentin (NEURONTIN) 100 MG capsule,  Take 2 capsules (200 mg total) by mouth at bedtime. .  potassium chloride SA (K-DUR,KLOR-CON) 20 MEQ tablet, Take 0.5 tablets (10 mEq total) by mouth daily. Marland Kitchen  tiZANidine (ZANAFLEX) 4 MG tablet, Take 1 tablet (4 mg total) by mouth Nightly. .  venlafaxine XR (EFFEXOR XR) 37.5 MG 24 hr capsule, Take 1 capsule (37.5 mg total) by mouth daily with breakfast. .  venlafaxine XR (EFFEXOR XR) 75 MG 24 hr capsule, Take 1 capsule (75 mg total) by mouth daily with breakfast. .  Vitamin D, Ergocalciferol, (DRISDOL) 1.25 MG (50000 UT) CAPS capsule, Take 1 capsule (50,000 Units total) by mouth every 7 (seven) days.   Reviewed prior external information including notes and imaging from  primary care provider As well as notes that were  available from care everywhere and other healthcare systems.  Past medical history, social, surgical and family history all reviewed in electronic medical record.  No pertanent information unless stated regarding to the chief complaint.   Review of Systems:  No headache, visual changes, nausea, vomiting, diarrhea, constipation, dizziness, abdominal pain, skin rash, fevers, chills, night sweats, weight loss, swollen lymph nodes, body aches, joint swelling, chest pain, shortness of breath, mood changes. POSITIVE muscle aches  Objective  Blood pressure 100/80, pulse 94, height 5\' 8"  (1.727 m), weight 180 lb (81.6 kg), SpO2 98 %.   General: No apparent distress alert and oriented x3 mood and affect normal, dressed appropriately.  HEENT: Pupils equal, extraocular movements intact  Respiratory: Patient's speak in full sentences and does not appear short of breath  Cardiovascular: No lower extremity edema, non tender, no erythema  Neuro: Cranial nerves II through XII are intact, neurovascularly intact in all extremities with 2+ DTRs and 2+ pulses.  Gait normal with good balance and coordination.  MSK:  Non tender with full range of motion and good stability and symmetric strength and tone of shoulders, elbows, wrist, hip, knee and ankles bilaterally.   Back exam shows the patient does have some mild loss of lordosis.  Some tenderness to palpation in the paraspinal musculature right greater than left.  Mild tightness on the right straight leg compared to the left.  Osteopathic findings  T10 extended rotated and side bent left L2 flexed rotated and side bent right Sacrum right on right    Impression and Recommendations:     This case required medical decision making of moderate complexity. The above documentation has been reviewed and is accurate and complete Lyndal Pulley, DO       Note: This dictation was prepared with Dragon dictation along with smaller phrase technology. Any  transcriptional errors that result from this process are unintentional.

## 2020-02-17 ENCOUNTER — Encounter: Payer: Self-pay | Admitting: Family Medicine

## 2020-02-17 NOTE — Assessment & Plan Note (Signed)

## 2020-02-17 NOTE — Assessment & Plan Note (Signed)
Chronic problem but stable, discussed medication management including the Zanaflex, home exercises and icing regimen.  Responds well to osteopathic manipulation.  Discussed continuing to stay active.  Follow-up again 6 to 8 weeks

## 2020-03-02 DIAGNOSIS — M545 Low back pain: Secondary | ICD-10-CM | POA: Diagnosis not present

## 2020-03-29 ENCOUNTER — Encounter: Payer: Self-pay | Admitting: Family Medicine

## 2020-03-30 ENCOUNTER — Other Ambulatory Visit: Payer: Self-pay

## 2020-03-30 DIAGNOSIS — M5416 Radiculopathy, lumbar region: Secondary | ICD-10-CM

## 2020-03-30 MED ORDER — TIZANIDINE HCL 4 MG PO TABS: 4.0000 mg | ORAL_TABLET | Freq: Every evening | ORAL | 1 refills | Status: DC

## 2020-03-30 MED FILL — tiZANidine HCL 4 MG TABS: 4 | 90 days supply | Qty: 90 | Fill #0

## 2020-04-04 ENCOUNTER — Other Ambulatory Visit: Payer: Self-pay

## 2020-04-04 ENCOUNTER — Encounter: Payer: Self-pay | Admitting: Family Medicine

## 2020-04-04 ENCOUNTER — Ambulatory Visit: Payer: 59 | Admitting: Family Medicine

## 2020-04-04 VITALS — BP 110/72 | HR 76 | Ht 68.0 in | Wt 189.0 lb

## 2020-04-04 DIAGNOSIS — M5416 Radiculopathy, lumbar region: Secondary | ICD-10-CM | POA: Diagnosis not present

## 2020-04-04 DIAGNOSIS — M999 Biomechanical lesion, unspecified: Secondary | ICD-10-CM | POA: Diagnosis not present

## 2020-04-04 NOTE — Assessment & Plan Note (Signed)
Chronic problem with mild exacerbation.  Patient has been set up with another epidural that I think will be beneficial.  We have discussed the possibility of facet injections.  Patient is almost discontinue the Effexor but encouraged her to go back to 37.5 mg.  And Zanaflex for breakthrough.  Discussed icing regimen and home exercises.  Follow-up again in 4 to 6 weeks

## 2020-04-04 NOTE — [facility] Notes (Signed)
[place] Phone: [phone] Subjective:   Shelby Shelby [name], am serving as a scribe for Dr. Hulan Saas. This visit occurred during the SARS-CoV-2 public health emergency.  Safety protocols were in place, including screening questions prior to the visit, additional usage of staff PPE, and extensive cleaning of exam room while observing appropriate contact time as indicated for disinfecting solutions.   I'm seeing this patient by the request  of:  Crist Infante, MD  CC: Low back pain follow-up  [id number]  Shelby Shelby [name] is a 44 y.o. female coming in with complaint of back and neck pain. OMT 02/16/2020. Patient states that she has been active but having tightness in back. Is unable to do yoga with out pain and plays tennis but cannot move as well due to pain. Epidural scheduled for Wednesday.   Medications patient has been prescribed: Effexor intermittently, Zanaflex intermittently           Reviewed prior external information including notes and imaging from previsou exam, outside providers and external EMR if available.   As well as notes that were available from care everywhere and other healthcare systems.  Past medical history, social, surgical and family history all reviewed in electronic medical record.  Shelby [name] information unless stated regarding to the chief complaint.   Past Medical History:  Diagnosis Date   Anxiety    GERD (gastroesophageal reflux disease)     Shelby [name] Known Allergies   Review of Systems:  Shelby [name] headache, visual changes, nausea, vomiting, diarrhea, constipation, dizziness, abdominal pain, skin rash, fevers, chills, night sweats, weight loss, swollen lymph nodes, body aches, joint swelling, chest pain, shortness of breath, mood changes. POSITIVE muscle aches  Objective  There were Shelby [name] vitals taken for this visit.   General: Shelby [name] apparent distress alert and oriented x3 mood and  affect normal, dressed appropriately.  HEENT: Pupils equal, extraocular movements intact  Respiratory: Patient's speak in full sentences and does not appear short of breath  Cardiovascular: Shelby [name] lower extremity edema, non tender, Shelby [name] erythema  Neuro: Cranial nerves II through XII are intact, neurovascularly intact in all extremities with 2+ DTRs and 2+ pulses.  Gait normal with good balance and coordination.  MSK:  Non tender with full range of motion and good stability and symmetric strength and tone of shoulders, elbows, wrist, hip, knee and ankles bilaterally.  Back -not examined does show some mild loss of lordosis.  Tenderness to palpation in the paraspinal musculature of the lumbar spine.  Does have some mild worsening pain with extension or full flexion of the back.  Improvement in range of motion with FABER test.  5 out of 5 strength of the lower extremities.  Osteopathic findings  C2 flexed rotated and side bent right C6 flexed rotated and side bent left T3 extended rotated and side bent right inhaled rib T9 extended rotated and side bent left L2 flexed rotated and side bent right Sacrum right on right       Assessment and Plan:  Acute lumbar radiculopathy Chronic problem with mild exacerbation.  Patient has been set up with another epidural that I think will be beneficial.  We have discussed the possibility of facet injections.  Patient is almost discontinue the Effexor but encouraged her to go back to 37.5 mg.  And Zanaflex for breakthrough.  Discussed icing regimen and home exercises.  Follow-up again in 4 to 6 weeks    Nonallopathic problems  Decision  today to treat with OMT was based on Physical Exam  After verbal consent patient was treated with HVLA, ME, FPR techniques in cervical, rib, thoracic, lumbar, and sacral  areas  Patient tolerated the procedure well with improvement in symptoms  Patient given exercises, stretches and lifestyle modifications  See  medications in patient instructions if given  Patient will follow up in 4-8 weeks      The above documentation has been reviewed and is accurate and complete [name]       Note: This dictation was prepared with Dragon dictation along with smaller phrase technology. Any transcriptional errors that result from this process are unintentional.

## 2020-04-06 ENCOUNTER — Ambulatory Visit
Admission: RE | Admit: 2020-04-06 | Discharge: 2020-04-06 | Disposition: A | Discharge status: Home / Self Care | Payer: 59 | Source: Ambulatory Visit | Attending: Family Medicine | Admitting: Family Medicine

## 2020-04-06 ENCOUNTER — Other Ambulatory Visit: Payer: Self-pay

## 2020-04-06 DIAGNOSIS — M545 Low back pain: Secondary | ICD-10-CM | POA: Diagnosis not present

## 2020-04-06 DIAGNOSIS — M5416 Radiculopathy, lumbar region: Secondary | ICD-10-CM

## 2020-04-06 MED ORDER — IOPAMIDOL (ISOVUE-M 200) INJECTION 41%
1.0000 mL | Freq: Once | INTRAMUSCULAR | Status: AC
  Administered 2020-04-06: 1 mL via EPIDURAL

## 2020-04-06 MED ORDER — METHYLPREDNISOLONE ACETATE 40 MG/ML INJ SUSP (RADIOLOG
120.0000 mg | Freq: Once | INTRAMUSCULAR | Status: AC
  Administered 2020-04-06: 120 mg via EPIDURAL

## 2020-04-06 NOTE — Discharge Instructions (Signed)

## 2020-06-10 ENCOUNTER — Encounter: Payer: Self-pay | Admitting: Family Medicine

## 2020-06-10 ENCOUNTER — Other Ambulatory Visit: Payer: Self-pay

## 2020-06-10 ENCOUNTER — Ambulatory Visit (INDEPENDENT_AMBULATORY_CARE_PROVIDER_SITE_OTHER): Payer: 59 | Admitting: Family Medicine

## 2020-06-10 DIAGNOSIS — M62830 Muscle spasm of back: Secondary | ICD-10-CM | POA: Diagnosis not present

## 2020-06-10 MED ORDER — TRAMADOL HCL 50 MG PO TABS: 50.0000 mg | ORAL_TABLET | Freq: Two times a day (BID) | ORAL | 0 refills | Status: DC | PRN

## 2020-06-10 MED ORDER — KETOROLAC TROMETHAMINE 60 MG/2ML IM SOLN
60.0000 mg | Freq: Once | INTRAMUSCULAR | Status: AC
  Administered 2020-06-10: 60 mg via INTRAMUSCULAR

## 2020-06-10 MED ORDER — DIAZEPAM 5 MG PO TABS: 5.0000 mg | ORAL_TABLET | Freq: Two times a day (BID) | ORAL | 0 refills | Status: DC | PRN

## 2020-06-10 MED ORDER — METHYLPREDNISOLONE ACETATE 80 MG/ML IJ SUSP
80.0000 mg | Freq: Once | INTRAMUSCULAR | Status: AC
  Administered 2020-06-10: 80 mg via INTRAMUSCULAR

## 2020-06-10 MED FILL — diazePAM 5 MG TABS: 5 | 5 days supply | Qty: 10 | Fill #0

## 2020-06-10 MED FILL — traMADol HCL 50 MG TABS: 50 | 30 days supply | Qty: 60 | Fill #0

## 2020-06-10 NOTE — Patient Instructions (Addendum)
Starting tomorrow night 5 pills 5 pills 4 pills 4 pills 3 pills stop Stop the tanzidine and try valium if bad up to 2 times a day  2 injections today (toradol and depomedrol Tramadol for breakthrough pain  See me again 1-2 weeks

## 2020-06-10 NOTE — [facility] Notes (Signed)
[place] Phone: [phone] Subjective:    I'm seeing this patient by the request  of:  Crist Infante, MD  CC: Low back pain exacerbation  [id number]  Shelby [name] is a 44 y.o. female coming in with complaint of back pain. Last seen 04/04/2020 for OMT. Patient states her back got really bad yesterday. Doesn't remember the MOI. Right leg is most painful. Mostly hip pain. Right sided lower back/hip. States she feels like she is "frozen" on the right side. Sitting down is painful.   Patient has had flares like this previously patient states that she does not know exactly what caused it, and did have some aggravation when she was trying to do cardio tenderness.     Past Medical History:  Diagnosis Date  . Anxiety   . GERD (gastroesophageal reflux disease)    Past Surgical History:  Procedure Laterality Date  . Cyst remove  12/2009  . INTRAUTERINE DEVICE INSERTION  12/2009   Social History   Socioeconomic History  . Marital status: Married    Spouse name: Not on file  . Number of children: Not on file  . Years of education: Not on file  . Highest education level: Not on file  Occupational History  . Not on file  Tobacco Use  . Smoking status: Never Smoker  . Smokeless tobacco: Never Used  . 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 Alcohol use: Yes  . Drug use: No  . Sexual activity: Not on file  Other Topics Concern  . Not on file  Social History Narrative  . Not on file   Social Determinants of Health   Financial Resource Strain:   . Difficulty of Paying Living Expenses: Not on file  Food Insecurity:   . Worried About Charity fundraiser in the Last Year: Not on file  . Ran Out of Food in the Last Year: Not on file  Transportation Needs:   . Lack of Transportation (Medical): Not on file  .  Lack of Transportation (Non-Medical): Not on file  Physical Activity:   . Days of Exercise per Week: Not on file  . Minutes of Exercise per Session: Not on file  Stress:   . Feeling of Stress : Not on file  Social Connections:   . Frequency of Communication with Friends and Family: Not on file  . Frequency of Social Gatherings with Friends and Family: Not on file  . Attends Religious Services: Not on file  . Active Member of Clubs or Organizations: Not on file  . Attends Archivist Meetings: Not on file  . Marital Status: Not on file   No Known Allergies Family History  Problem Relation Age of Onset  . Hyperlipidemia Mother   . Hypertension Mother   . Depression Mother   . Hypertension Father   . Bipolar disorder Sister        x 2    Current Outpatient Medications (Endocrine & Metabolic):  .  levonorgestrel (MIRENA) 20 MCG/24HR IUD, 1 Intra Uterine Device (1 each total) by Intrauterine route once.    Current Outpatient Medications (Analgesics):  .  traMADol (ULTRAM) 50 MG tablet, Take 1 tablet (50 mg total) by mouth every 12 (twelve) hours as needed.   Current Outpatient Medications (Other):  .  gabapentin (NEURONTIN) 100  MG capsule, Take 2 capsules (200 mg total) by mouth at bedtime. .  potassium chloride SA (K-DUR,KLOR-CON) 20 MEQ tablet, Take 0.5 tablets (10 mEq total) by mouth daily. Marland Kitchen  tiZANidine (ZANAFLEX) 4 MG tablet, Take 1 tablet (4 mg total) by mouth Nightly. .  venlafaxine XR (EFFEXOR XR) 37.5 MG 24 hr capsule, Take 1 capsule (37.5 mg total) by mouth daily with breakfast. .  venlafaxine XR (EFFEXOR XR) 75 MG 24 hr capsule, Take 1 capsule (75 mg total) by mouth daily with breakfast. .  Vitamin D, Ergocalciferol, (DRISDOL) 1.25 MG (50000 UT) CAPS capsule, Take 1 capsule (50,000 Units total) by mouth every 7 (seven) days. .  diazepam (VALIUM) 5 MG tablet, Take 1 tablet (5 mg total) by mouth every 12 (twelve) hours as needed.   Reviewed prior external  information including notes and imaging from  primary care provider As well as notes that were available from care everywhere and other healthcare systems.  Past medical history, social, surgical and family history all reviewed in electronic medical record.  No pertanent information unless stated regarding to the chief complaint.   Review of Systems:  No headache, visual changes, nausea, vomiting, diarrhea, constipation, dizziness, abdominal pain, skin rash, fevers, chills, night sweats, weight loss, swollen lymph nodes, body aches, joint swelling, chest pain, shortness of breath, mood changes. POSITIVE muscle aches  Objective  Blood pressure (!) 150/100, pulse (!) 101, height 5\' 8"  (1.727 m), weight 190 lb (86.2 kg), SpO2 98 %.   General: Patient appears to be very uncomfortable at this time.  Tearful laying down on the table. His back is significantly tight patient's laying down and does not want to move a significant amount.  Is able to bring up the legs though without any significant discomfort very tender to palpation more though in the musculature.  Patient has some mild discomfort at the periumbilical area Neurovascularly intact distally

## 2020-06-10 NOTE — Assessment & Plan Note (Signed)
Muscle spasm.  Discussed with patient agreement.  Toradol and Depo-Medrol given today.  Patient given Valium for more of a different muscle relaxant, also given tramadol for breakthrough pain.  Patient has had known muscle spasm previously and does have the nerve impingement but no significant radiation of the pain.  Discussed with patient that if worsening pain I would like to consider the laboratory work-up.  May need more of a CT abdomen pelvis with and without contrast as well patient having some mild abdominal pain with it.  Patient is in agreement with this and knows that if any worsening symptoms to seek medical attention immediately.  Follow-up again 1 to 2 weeks

## 2020-06-17 NOTE — Progress Notes (Signed)
[place] [address] [place] Phone: [phone] Subjective:   I Shelby [name] am serving as a Education administrator for Dr. Hulan Saas.  This visit occurred during the SARS-CoV-2 public health emergency.  Safety protocols were in place, including screening questions prior to the visit, additional usage of staff PPE, and extensive cleaning of exam room while observing appropriate contact time as indicated for disinfecting solutions.   I'm seeing this patient by the request  of:  Crist Infante, MD  CC: Low back pain follow-up  EGB:[id number]   06/10/2020 Muscle spasm.  Discussed with patient agreement.  Toradol and Depo-Medrol given today.  Patient given Valium for more of a different muscle relaxant, also given tramadol for breakthrough pain.  Patient has had known muscle spasm previously and does have the nerve impingement but no significant radiation of the pain.  Discussed with patient that if worsening pain I would like to consider the laboratory work-up.  May need more of a CT abdomen pelvis with and without contrast as well patient having some mild abdominal pain with it.  Patient is in agreement with this and knows that if any worsening symptoms to seek medical attention immediately.  Follow-up again 1 to 2 weeks  Update 06/21/2020 Shelby [name] is a 44 y.o. female coming in with complaint of back and neck pain Patient states she is doing a lot better today.   Medications patient has been prescribed: Tramadol, Zanaflex, Effexor  Taking: Yes         Reviewed prior external information including notes and imaging from previsou exam, outside providers and external EMR if available.   As well as notes that were available from care everywhere and other healthcare systems.  Past medical history, social, surgical and family history all reviewed in electronic medical record.  No pertanent information unless stated regarding to the chief complaint.    Past Medical History:  Diagnosis Date  . Anxiety   . GERD (gastroesophageal reflux disease)     No Known Allergies   Review of Systems:  No headache, visual changes, nausea, vomiting, diarrhea, constipation, dizziness, abdominal pain, skin rash, fevers, chills, night sweats, weight loss, swollen lymph nodes, body aches, joint swelling, chest pain, shortness of breath, mood changes. POSITIVE muscle aches  Objective  Blood pressure 100/90, pulse 61, height 5\' 8"  (1.727 m), weight 185 lb (83.9 kg), SpO2 98 %.   General: No apparent distress alert and oriented x3 mood and affect normal, dressed appropriately.  HEENT: Pupils equal, extraocular movements intact  Respiratory: Patient's speak in full sentences and does not appear short of breath  Cardiovascular: No lower extremity edema, non tender, no erythema  Neuro: Cranial nerves II through XII are intact, neurovascularly intact in all extremities with 2+ DTRs and 2+ pulses.  Gait normal with good balance and coordination.  MSK:  Non tender with full range of motion and good stability and symmetric strength and tone of shoulders, elbows, wrist, hip, knee and ankles bilaterally.  Back -low back still with significant tightness noted.  Patient does still favoring the right hip area.  Patient has a negative straight leg test though today.  Improvement in range of motion from what patient was having previously.  Osteopathic findings   T7 extended rotated and side bent left L2 flexed rotated and side bent right Sacrum right on right       Assessment and Plan:  Acute lumbar radiculopathy Improvement noted, has known to have the  nerve impingement.  Patient still wants to avoid surgical intervention.  Seems to be the lack of movement and the lack of exercise seem to be worse.  Discussed taking Tylenol with the tramadol could have a synergistic effect.  Follow-up with me again 4 to 6 weeks    Nonallopathic problems  Decision today to  treat with OMT was based on Physical Exam  After verbal consent patient was treated with HVLA, ME, FPR techniques in  thoracic, lumbar, and sacral  areas  Patient tolerated the procedure well with improvement in symptoms  Patient given exercises, stretches and lifestyle modifications  See medications in patient instructions if given  Patient will follow up in 4-6 weeks      The above documentation has been reviewed and is accurate and complete Lyndal Pulley, DO       Note: This dictation was prepared with Dragon dictation along with smaller phrase technology. Any transcriptional errors that result from this process are unintentional.

## 2020-06-21 ENCOUNTER — Ambulatory Visit: Payer: 59 | Admitting: Family Medicine

## 2020-06-21 ENCOUNTER — Encounter: Payer: Self-pay | Admitting: Family Medicine

## 2020-06-21 ENCOUNTER — Other Ambulatory Visit: Payer: Self-pay

## 2020-06-21 VITALS — BP 100/90 | HR 61 | Ht 68.0 in | Wt 185.0 lb

## 2020-06-21 DIAGNOSIS — M999 Biomechanical lesion, unspecified: Secondary | ICD-10-CM

## 2020-06-21 DIAGNOSIS — M5416 Radiculopathy, lumbar region: Secondary | ICD-10-CM | POA: Diagnosis not present

## 2020-06-21 NOTE — Assessment & Plan Note (Signed)
Improvement noted, has known to have the nerve impingement.  Patient still wants to avoid surgical intervention.  Seems to be the lack of movement and the lack of exercise seem to be worse.  Discussed taking Tylenol with the tramadol could have a synergistic effect.  Follow-up with me again 4 to 6 weeks

## 2020-06-21 NOTE — Patient Instructions (Signed)
Good to see you See me again in  

## 2020-06-30 DIAGNOSIS — R1031 Right lower quadrant pain: Secondary | ICD-10-CM | POA: Diagnosis not present

## 2020-06-30 DIAGNOSIS — R29898 Other symptoms and signs involving the musculoskeletal system: Secondary | ICD-10-CM | POA: Diagnosis not present

## 2020-06-30 DIAGNOSIS — Z30432 Encounter for removal of intrauterine contraceptive device: Secondary | ICD-10-CM | POA: Diagnosis not present

## 2020-07-11 ENCOUNTER — Encounter: Payer: Self-pay | Admitting: Family Medicine

## 2020-07-11 ENCOUNTER — Other Ambulatory Visit: Payer: Self-pay

## 2020-07-11 MED ORDER — VENLAFAXINE HCL ER 75 MG PO CP24: 75.0000 mg | ORAL_CAPSULE | Freq: Every day | ORAL | 1 refills | Status: DC

## 2020-07-11 MED ORDER — VENLAFAXINE HCL ER 37.5 MG PO CP24: 37.5000 mg | ORAL_CAPSULE | Freq: Every day | ORAL | 0 refills | Status: DC

## 2020-07-11 MED FILL — VENLAFAXINE HCL ER 37.5 MG: 37.5 | 30 days supply | Qty: 30 | Fill #0

## 2020-07-11 MED FILL — VENLAFAXINE HCL ER 75 MG CA: 75 | 90 days supply | Qty: 90 | Fill #0

## 2020-07-20 ENCOUNTER — Other Ambulatory Visit: Payer: Self-pay

## 2020-07-20 ENCOUNTER — Ambulatory Visit: Payer: 59 | Admitting: Family Medicine

## 2020-07-20 ENCOUNTER — Encounter: Payer: Self-pay | Admitting: Family Medicine

## 2020-07-20 VITALS — BP 112/74 | HR 76 | Ht 68.0 in | Wt 186.0 lb

## 2020-07-20 DIAGNOSIS — M999 Biomechanical lesion, unspecified: Secondary | ICD-10-CM

## 2020-07-20 DIAGNOSIS — M62830 Muscle spasm of back: Secondary | ICD-10-CM

## 2020-07-20 NOTE — Progress Notes (Signed)
[place] Phone: [phone] Subjective:   [name], am serving as a scribe for Dr. Hulan Saas. This visit occurred during the SARS-CoV-2 public health emergency.  Safety protocols were in place, including screening questions prior to the visit, additional usage of staff PPE, and extensive cleaning of exam room while observing appropriate contact time as indicated for disinfecting solutions.   I'm seeing this patient by the request  of:  Crist Infante, MD  CC: low back pain   NID:[id number]  [name] is a 44 y.o. female coming in with complaint of back and neck pain. OMT 06/21/2020. Patient states   Medications patient has been prescribed: Effexor, Zanaflex, Vit D, Tramadol  Taking:         Reviewed prior external information including notes and imaging from previsou exam, outside providers and external EMR if available.   As well as notes that were available from care everywhere and other healthcare systems.  Past medical history, social, surgical and family history all reviewed in electronic medical record.  No pertanent information unless stated regarding to the chief complaint.   Past Medical History:  Diagnosis Date  . Anxiety   . GERD (gastroesophageal reflux disease)     No Known Allergies   Review of Systems:  No headache, visual changes, nausea, vomiting, diarrhea, constipation, dizziness, abdominal pain, skin rash, fevers, chills, night sweats, weight loss, swollen lymph nodes, body aches, joint swelling, chest pain, shortness of breath, mood changes. POSITIVE muscle aches  Objective  Blood pressure 112/74, pulse 76, height 5\' 8"  (1.727 m), weight 186 lb (84.4 kg), SpO2 95 %.   General: No apparent distress alert and oriented x3 mood and affect normal, dressed appropriately.  HEENT: Pupils equal, extraocular movements intact  Respiratory: Patient's speak in full sentences and  does not appear short of breath  Cardiovascular: No lower extremity edema, non tender, no erythema  Neuro: Cranial nerves II through XII are intact, neurovascularly intact in all extremities with 2+ DTRs and 2+ pulses.  Gait normal with good balance and coordination.  MSK:  Non tender with full range of motion and good stability and symmetric strength and tone of shoulders, elbows, wrist, hip, knee and ankles bilaterally.  Back - tightness noted patient does have tightness of the right sacroiliac joint.  Patient has also some mild discomfort in the left piriformis.  Tightness of the lumbar spine still noted.  Patient still having difficulty with the core strength  Osteopathic findings  T5 extended rotated and side bent left L4 flexed rotated and side bent left Sacrum right on right       Assessment and Plan:  Lumbar paraspinal muscle spasm More tightness than anything else at this time.  Patient continues to have pain but wants to avoid another epidural at this time.  We discussed icing regimen and home exercise, increase activity slowly over the course the next several weeks.  Patient seems to be doing fairly well with conservative therapy.  Discussed the Zanaflex at night.  Follow-up with me again 5 to 6 weeks    Nonallopathic problems  Decision today to treat with OMT was based on Physical Exam  After verbal consent patient was treated with HVLA, ME, FPR techniques in thoracic, lumbar, and sacral  areas  Patient tolerated the procedure well with improvement in symptoms  Patient given exercises, stretches and lifestyle modifications  See medications in patient instructions if given  Patient  will follow up in 4-8 weeks      The above documentation has been reviewed and is accurate and complete Lyndal Pulley, DO       Note: This dictation was prepared with Dragon dictation along with smaller phrase technology. Any transcriptional errors that result from this process are  unintentional.

## 2020-07-20 NOTE — Assessment & Plan Note (Signed)
More tightness than anything else at this time.  Patient continues to have pain but wants to avoid another epidural at this time.  We discussed icing regimen and home exercise, increase activity slowly over the course the next several weeks.  Patient seems to be doing fairly well with conservative therapy.  Discussed the Zanaflex at night.  Follow-up with me again 5 to 6 weeks

## 2020-08-03 MED FILL — tiZANidine HCL 4 MG TABS: 4 | 90 days supply | Qty: 90 | Fill #1

## 2020-08-10 DIAGNOSIS — Z01419 Encounter for gynecological examination (general) (routine) without abnormal findings: Secondary | ICD-10-CM | POA: Diagnosis not present

## 2020-08-10 DIAGNOSIS — G8929 Other chronic pain: Secondary | ICD-10-CM | POA: Insufficient documentation

## 2020-08-10 DIAGNOSIS — Z6828 Body mass index (BMI) 28.0-28.9, adult: Secondary | ICD-10-CM | POA: Diagnosis not present

## 2020-08-10 DIAGNOSIS — Z1231 Encounter for screening mammogram for malignant neoplasm of breast: Secondary | ICD-10-CM | POA: Diagnosis not present

## 2020-08-12 ENCOUNTER — Encounter: Payer: Self-pay | Admitting: Nurse Practitioner

## 2020-08-12 ENCOUNTER — Other Ambulatory Visit: Payer: Self-pay | Admitting: Family

## 2020-08-12 DIAGNOSIS — U071 COVID-19: Secondary | ICD-10-CM

## 2020-08-12 NOTE — Progress Notes (Signed)
I connected by phone with Shelby [name] on 08/12/2020 at 7:10 PM to discuss the potential use of a new treatment for mild to moderate COVID-19 viral infection in non-hospitalized patients.  This patient is a 44 y.o. female that meets the FDA criteria for Emergency Use Authorization of COVID monoclonal antibody casirivimab/imdevimab, bamlanivimab/eteseviamb, or sotrovimab.  Has a (+) direct SARS-CoV-2 viral test result  Has mild or moderate COVID-19   Is NOT hospitalized due to COVID-19  Is within 10 days of symptom onset  Has at least one of the high risk factor(s) for progression to severe COVID-19 and/or hospitalization as defined in EUA.  Specific high risk criteria : BMI > 25   Symptoms of head cold, aches, chest tightness began 08/11/20.   I have spoken and communicated the following to the patient or parent/caregiver regarding COVID monoclonal antibody treatment:  1. FDA has authorized the emergency use for the treatment of mild to moderate COVID-19 in adults and pediatric patients with positive results of direct SARS-CoV-2 viral testing who are 27 years of age and older weighing at least 40 kg, and who are at high risk for progressing to severe COVID-19 and/or hospitalization.  2. The significant known and potential risks and benefits of COVID monoclonal antibody, and the extent to which such potential risks and benefits are unknown.  3. Information on available alternative treatments and the risks and benefits of those alternatives, including clinical trials.  4. Patients treated with COVID monoclonal antibody should continue to self-isolate and use infection control measures (e.g., wear mask, isolate, social distance, avoid sharing personal items, clean and disinfect "high touch" surfaces, and frequent handwashing) according to CDC guidelines.   5. The patient or parent/caregiver has the option to accept or refuse COVID monoclonal antibody treatment.  After reviewing this  information with the patient, the patient has agreed to receive one of the available covid 19 monoclonal antibodies and will be provided an appropriate fact sheet prior to infusion. Asencion Gowda, NP 08/12/2020 7:10 PM

## 2020-08-13 ENCOUNTER — Ambulatory Visit (HOSPITAL_COMMUNITY)
Admission: RE | Admit: 2020-08-13 | Discharge: 2020-08-13 | Disposition: A | Discharge status: Home / Self Care | Payer: 59 | Source: Ambulatory Visit | Attending: Pulmonary Disease | Admitting: Pulmonary Disease

## 2020-08-13 ENCOUNTER — Other Ambulatory Visit (HOSPITAL_COMMUNITY): Payer: Self-pay

## 2020-08-13 DIAGNOSIS — U071 COVID-19: Secondary | ICD-10-CM | POA: Diagnosis not present

## 2020-08-13 MED ORDER — ALBUTEROL SULFATE HFA 108 (90 BASE) MCG/ACT IN AERS: 2.0000 | INHALATION_SPRAY | Freq: Once | RESPIRATORY_TRACT | Status: DC | PRN

## 2020-08-13 MED ORDER — SODIUM CHLORIDE 0.9 % IV SOLN: INTRAVENOUS | Status: DC | PRN

## 2020-08-13 MED ORDER — METHYLPREDNISOLONE SODIUM SUCC 125 MG IJ SOLR: 125.0000 mg | Freq: Once | INTRAMUSCULAR | Status: DC | PRN

## 2020-08-13 MED ORDER — FAMOTIDINE IN NACL 20-0.9 MG/50ML-% IV SOLN: 20.0000 mg | Freq: Once | INTRAVENOUS | Status: DC | PRN

## 2020-08-13 MED ORDER — EPINEPHRINE 0.3 MG/0.3ML IJ SOAJ: 0.3000 mg | Freq: Once | INTRAMUSCULAR | Status: DC | PRN

## 2020-08-13 MED ORDER — SOTROVIMAB 500 MG/8ML IV SOLN
500.0000 mg | Freq: Once | INTRAVENOUS | Status: AC
  Administered 2020-08-13: 500 mg via INTRAVENOUS

## 2020-08-13 MED ORDER — DIPHENHYDRAMINE HCL 50 MG/ML IJ SOLN: 50.0000 mg | Freq: Once | INTRAMUSCULAR | Status: DC | PRN

## 2020-08-13 NOTE — Discharge Instructions (Signed)

## 2020-08-13 NOTE — Progress Notes (Signed)
  Diagnosis: COVID-19  Physician: Asencion Noble, MD  Procedure: Sotrovimab Infusion   Complications: No immediate complications noted.  Discharge: Discharged home   Shelby [name] 08/13/2020

## 2020-08-13 NOTE — Progress Notes (Signed)
  Diagnosis: COVID-19  Physician:dr wright  Procedure: sotrovimab  Complications: No immediate complications noted.  Discharge: Discharged home   [name] 08/13/2020

## 2020-08-21 ENCOUNTER — Telehealth: Payer: 59 | Admitting: Physician Assistant

## 2020-08-21 ENCOUNTER — Encounter: Payer: Self-pay | Admitting: Physician Assistant

## 2020-08-21 ENCOUNTER — Other Ambulatory Visit: Payer: Self-pay | Admitting: Physician Assistant

## 2020-08-21 DIAGNOSIS — H1031 Unspecified acute conjunctivitis, right eye: Secondary | ICD-10-CM | POA: Diagnosis not present

## 2020-08-21 MED ORDER — POLYMYXIN B-TRIMETHOPRIM 10000-0.1 UNIT/ML-% OP SOLN: 1.0000 [drp] | OPHTHALMIC | 0 refills | Status: DC

## 2020-08-21 MED ORDER — POLYMYXIN B-TRIMETHOPRIM 10000-0.1 UNIT/ML-% OP SOLN: 1.0000 [drp] | Freq: Four times a day (QID) | OPHTHALMIC | 0 refills | Status: DC

## 2020-08-21 NOTE — Progress Notes (Signed)
E-Visit for [employer]   We are sorry that you are not feeling well.  Here is how we plan to help!  Based on what you have shared with me it looks like you have conjunctivitis.  Conjunctivitis is a common inflammatory or infectious condition of the eye that is often referred to as "pink eye".  In most cases it is contagious (viral or bacterial). However, not all conjunctivitis requires antibiotics (ex. Allergic).  We have made appropriate suggestions for you based upon your presentation.  I have prescribed Polytrim Ophthalmic drops 1-2 drops 4 times a day times 5 days  Pink eye can be highly contagious.  It is typically spread through direct contact with secretions, or contaminated objects or surfaces that one may have touched.  Strict handwashing is suggested with soap and water is urged.  If not available, use alcohol based had sanitizer.  Avoid unnecessary touching of the eye.  If you wear contact lenses, you will need to refrain from wearing them until you see no white discharge from the eye for at least 24 hours after being on medication.  You should see symptom improvement in 1-2 days after starting the medication regimen.  Call us if symptoms are not improved in 1-2 days.  Home Care:  Wash your hands often!  Do not wear your contacts until you complete your treatment plan.  Avoid sharing towels, bed linen, personal items with a person who has pink eye.  See attention for anyone in your home with similar symptoms.  Get Help Right Away If:  Your symptoms do not improve.  You develop blurred or loss of vision.  Your symptoms worsen (increased discharge, pain or redness)  Your e-visit answers were reviewed by a board certified advanced clinical practitioner to complete your personal care plan.  Depending on the condition, your plan could have included both over the counter or prescription medications.  If there is a problem please reply  once you have received a response from your  provider.  Your safety is important to [place].  If you have drug allergies check your prescription carefully.    You can use MyChart to ask questions about todays visit, request a non-urgent call back, or ask for a work or school excuse for 24 hours related to this e-Visit. If it has been greater than 24 hours you will need to follow up with your provider, or enter a new e-Visit to address those concerns.   You will get an e-mail in the next two days asking about your experience.  I hope that your e-visit has been valuable and will speed your recovery. Thank you for using e-visits.     I spent 5-10 minutes on review and completion of this note- Lacy Duverney [facility]

## 2020-08-21 NOTE — Addendum Note (Signed)
Addended by: Waldon Merl on: 08/21/2020 11:33 AM   Modules accepted: Orders

## 2020-08-22 MED FILL — POLYMYXIN B/TMP EYE DROPS: 10000-0.1 | 34 days supply | Qty: 10 | Fill #0

## 2020-08-23 ENCOUNTER — Ambulatory Visit (HOSPITAL_COMMUNITY)
Admission: RE | Admit: 2020-08-23 | Discharge: 2020-08-23 | Disposition: A | Discharge status: Home / Self Care | Payer: 59 | Source: Ambulatory Visit | Attending: Internal Medicine | Admitting: Internal Medicine

## 2020-08-23 ENCOUNTER — Other Ambulatory Visit: Payer: Self-pay

## 2020-08-23 ENCOUNTER — Other Ambulatory Visit (HOSPITAL_COMMUNITY): Payer: Self-pay | Admitting: Internal Medicine

## 2020-08-23 DIAGNOSIS — M79605 Pain in left leg: Secondary | ICD-10-CM | POA: Insufficient documentation

## 2020-08-23 DIAGNOSIS — R600 Localized edema: Secondary | ICD-10-CM | POA: Insufficient documentation

## 2020-08-23 NOTE — Progress Notes (Signed)
[place] [address] [place] Phone: [phone] Subjective:   I Shelby [name] am serving as a Education administrator for Dr. Hulan Saas.  This visit occurred during the SARS-CoV-2 public health emergency.  Safety protocols were in place, including screening questions prior to the visit, additional usage of staff PPE, and extensive cleaning of exam room while observing appropriate contact time as indicated for disinfecting solutions.   I'm seeing this patient by the request  of:  Crist Infante, MD  CC: Low back pain follow  [id number]  Shelby [name] is a 44 y.o. female coming in with complaint of back and neck pain. OMT 07/20/2020. Patient states she is feeling about the same today. Starting to have high blood pressure. Covid 10 days ago.  Patient still feels significantly fatigued.  Was not working out on a regular basis.  Medications patient has been prescribed: Zanaflex, gabapentin, tramadol, Effexor  Taking: Intermittently         Reviewed prior external information including notes and imaging from previsou exam, outside providers and external EMR if available.   As well as notes that were available from care everywhere and other healthcare systems.  Past medical history, social, surgical and family history all reviewed in electronic medical record.  No pertanent information unless stated regarding to the chief complaint.   Past Medical History:  Diagnosis Date  . Anxiety   . GERD (gastroesophageal reflux disease)     No Known Allergies   Review of Systems:  No headache, visual changes, nausea, vomiting, diarrhea, constipation, dizziness, abdominal pain, skin rash, fevers, chills, night sweats, weight loss, swollen lymph nodes, body aches, joint swelling, chest pain, shortness of breath, mood changes. POSITIVE muscle aches  Objective  Blood pressure 140/90, pulse 95, height 5\' 9"  (1.753 m), weight 185 lb (83.9 kg), SpO2 98  %.   General: No apparent distress alert and oriented x3 mood and affect normal, dressed appropriately.  HEENT: Pupils equal, extraocular movements intact  Respiratory: Patient's speak in full sentences and does not appear short of breath  Cardiovascular: No lower extremity edema, non tender, no erythema  Neuro: Cranial nerves II through XII are intact, neurovascularly intact in all extremities with 2+ DTRs and 2+ pulses.  Gait normal with good balance and coordination.  MSK:  Non tender with full range of motion and good stability and symmetric strength and tone of shoulders, elbows, wrist, hip, knee and ankles bilaterally.  Back -tighter than usual but no true radicular symptoms.  Patient does have tenderness diffusely in the paraspinal musculature of the lumbar spine.  Negative straight leg test.  Mild increase in pain though with extension greater than 10.   Osteopathic findings  C6 flexed rotated and side bent left T3 extended rotated and side bent right inhaled rib T9 extended rotated and side bent left L2 flexed rotated and side bent right Sacrum right on right       Assessment and Plan:  Acute lumbar radiculopathy Patient has some mild increase in tightness.  Patient was not working out as much when she had the Covid.  Started to now play tennis again.  Discussed posture and ergonomics.  Patient will likely have some endurance problem for some time.  Patient has different medications including gabapentin, Zanaflex, tramadol.    Nonallopathic problems  Decision today to treat with OMT was based on Physical Exam  After verbal consent patient was treated with HVLA, ME, FPR techniques in  thoracic, lumbar,  and sacral  areas  Patient tolerated the procedure well with improvement in symptoms  Patient given exercises, stretches and lifestyle modifications  See medications in patient instructions if given  Patient will follow up in 4-8 weeks      The above  documentation has been reviewed and is accurate and complete Lyndal Pulley, DO       Note: This dictation was prepared with Dragon dictation along with smaller phrase technology. Any transcriptional errors that result from this process are unintentional.

## 2020-08-24 ENCOUNTER — Encounter: Payer: Self-pay | Admitting: Family Medicine

## 2020-08-24 ENCOUNTER — Ambulatory Visit (INDEPENDENT_AMBULATORY_CARE_PROVIDER_SITE_OTHER): Payer: 59 | Admitting: Family Medicine

## 2020-08-24 DIAGNOSIS — M999 Biomechanical lesion, unspecified: Secondary | ICD-10-CM

## 2020-08-24 DIAGNOSIS — M5416 Radiculopathy, lumbar region: Secondary | ICD-10-CM | POA: Diagnosis not present

## 2020-08-24 NOTE — Assessment & Plan Note (Signed)
Patient has some mild increase in tightness.  Patient was not working out as much when she had the Covid.  Started to now play tennis again.  Discussed posture and ergonomics.  Patient will likely have some endurance problem for some time.  Patient has different medications including gabapentin, Zanaflex, tramadol.

## 2020-08-24 NOTE — Patient Instructions (Signed)
COQ10 200 mg  Baby asprin Enjoy tennis See me again in 4-5 weeks

## 2020-09-22 ENCOUNTER — Other Ambulatory Visit: Payer: Self-pay | Admitting: Family Medicine

## 2020-09-22 ENCOUNTER — Ambulatory Visit: Payer: 59 | Admitting: Family Medicine

## 2020-09-22 ENCOUNTER — Other Ambulatory Visit: Payer: Self-pay

## 2020-09-22 ENCOUNTER — Encounter: Payer: Self-pay | Admitting: Family Medicine

## 2020-09-22 VITALS — BP 132/92 | HR 90 | Ht 69.0 in | Wt 185.0 lb

## 2020-09-22 DIAGNOSIS — M62830 Muscle spasm of back: Secondary | ICD-10-CM | POA: Diagnosis not present

## 2020-09-22 DIAGNOSIS — M999 Biomechanical lesion, unspecified: Secondary | ICD-10-CM | POA: Diagnosis not present

## 2020-09-22 MED ORDER — VENLAFAXINE HCL ER 37.5 MG PO CP24: 37.5000 mg | ORAL_CAPSULE | Freq: Every day | ORAL | 0 refills | Status: DC

## 2020-09-22 MED FILL — VENLAFAXINE HCL ER 37.5 MG: 37.5 | 90 days supply | Qty: 90 | Fill #0

## 2020-09-22 NOTE — Patient Instructions (Addendum)
Kardia app Blood pressure monitor and keep journal  If continue to be high recommend seing PCP or Cardiology  Will take some time to get your endurance back Effexor called in See me in 4-5 weeks

## 2020-09-22 NOTE — Progress Notes (Signed)
Shelby [name] [place] Phone: [phone] Subjective:   Shelby [name], am serving as a scribe for Dr. Hulan Saas. This visit occurred during the SARS-CoV-2 public health emergency.  Safety protocols were in place, including screening questions prior to the visit, additional usage of staff PPE, and extensive cleaning of exam room while observing appropriate contact time as indicated for disinfecting solutions.  I'm seeing this patient by the request  of:  Crist Infante, MD  CC: Low back pain follow-up  [id number]  Shelby [name] is a 44 y.o. female coming in with complaint of back and neck pain. OMT 08/24/2020. Patient states having some mild increase in low back pain.  Patient states now starting to have discomfort again with yoga.  For a while she was doing better and did not have as much pain.  Patient states some more gluteal pain on the right side.  Been doing the stretches regularly.  Feels like the Effexor may be necessary.  Patient is still significantly fatigued since she had Covid.  Has not build up her endurance yet  Medications patient has been prescribed: Effexor, gabapentin, Zanaflex         Reviewed prior external information including notes and imaging from previsou exam, outside providers and external EMR if available.   As well as notes that were available from care everywhere and other healthcare systems.  Past medical history, social, surgical and family history all reviewed in electronic medical record.  [name] information unless stated regarding to the chief complaint.   Past Medical History:  Diagnosis Date  . Anxiety   . GERD (gastroesophageal reflux disease)     [name] Known Allergies   Review of Systems:  [name] headache, visual changes, nausea, vomiting, diarrhea, constipation, dizziness, abdominal pain, skin rash, fevers, chills, night sweats, weight loss, swollen lymph nodes, , joint  swelling, chest pain, shortness of breath, mood changes. POSITIVE muscle aches, body aches  Objective  Blood pressure (!) 132/92, pulse 90, height 5\' 9"  (1.753 m), weight 185 lb (83.9 kg), SpO2 99 %.   General: [name] apparent distress alert and oriented x3 mood and affect normal, dressed appropriately.  HEENT: Pupils equal, extraocular movements intact  Respiratory: Patient's speak in full sentences and does not appear short of breath  Cardiovascular: [name] lower extremity edema, non tender, [name] erythema  Neuro: Cranial nerves II through XII are intact, neurovascularly intact in all extremities with 2+ DTRs and 2+ pulses.  Gait normal with good balance and coordination.  MSK:  Non tender with full range of motion and good stability and symmetric strength and tone of shoulders, elbows, wrist, hip, knee and ankles bilaterally.  Back -increasing tightness noted in the paraspinal musculature of the lumbar spine right greater than left.  Negative straight leg test.  5 out of 5 strength of the lower extremities.  Patient does have some tenderness in the gluteal area on the right side again.     Osteopathic findings  C6 flexed rotated and side bent left T3 extended rotated and side bent right inhaled rib T9 extended rotated and side bent left L2 flexed rotated and side bent right Sacrum right on right       Assessment and Plan:  Lumbar paraspinal muscle spasm Patient is doing relatively well.  May be some very mild radicular symptoms going into the right piriformis and gluteal area.  Patient has responded fairly well to manipulation.  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  Refilled patient's Effexor at the 37.5.  Patient has Zanaflex for breakthrough pain and gabapentin.  Follow-up again 4 to 6 weeks    Nonallopathic problems  Decision today to treat with OMT was based on  Physical Exam  After verbal consent patient was treated with HVLA, ME, FPR techniques in  thoracic, lumbar, and sacral  areas  Patient tolerated the procedure well with improvement in symptoms  Patient given exercises, stretches and lifestyle modifications  See medications in patient instructions if given  Patient will follow up in 4-8 weeks      The above documentation has been reviewed and is accurate and complete Lyndal Pulley, DO       Note: This dictation was prepared with Dragon dictation along with smaller phrase technology. Any transcriptional errors that result from this process are unintentional.

## 2020-09-22 NOTE — Assessment & Plan Note (Signed)
Patient is doing relatively well.  May be some very mild radicular symptoms going into the right piriformis and gluteal area.  Patient has responded fairly well to manipulation.  I do believe that patient still has some post viral myositis some increasing fatigue and that is likely contributing to some of the discomfort and pain as well.  Refilled patient's Effexor at the 37.5.  Patient has Zanaflex for breakthrough pain and gabapentin.  Follow-up again 4 to 6 weeks

## 2020-10-14 ENCOUNTER — Encounter: Payer: Self-pay | Admitting: Family Medicine

## 2020-10-14 ENCOUNTER — Other Ambulatory Visit: Payer: Self-pay

## 2020-10-14 DIAGNOSIS — M5416 Radiculopathy, lumbar region: Secondary | ICD-10-CM

## 2020-10-19 MED FILL — VENLAFAXINE HCL ER 37.5 MG: 37.5 | 90 days supply | Qty: 90 | Fill #0

## 2020-10-20 ENCOUNTER — Encounter: Payer: Self-pay | Admitting: Family Medicine

## 2020-10-20 ENCOUNTER — Ambulatory Visit: Payer: 59 | Admitting: Family Medicine

## 2020-10-20 ENCOUNTER — Other Ambulatory Visit: Payer: Self-pay

## 2020-10-20 VITALS — BP 124/88 | HR 68 | Ht 69.0 in | Wt 183.0 lb

## 2020-10-20 DIAGNOSIS — M999 Biomechanical lesion, unspecified: Secondary | ICD-10-CM

## 2020-10-20 DIAGNOSIS — M5416 Radiculopathy, lumbar region: Secondary | ICD-10-CM

## 2020-10-20 NOTE — Assessment & Plan Note (Signed)
Patient does not have any radicular symptoms at this time.  Discussed with patient about icing regimen, will have the epidural again but will consider the possibility of facet injections if this continues.  Do not think that this would make significant difference but could consider.  Patient will continue the medications.  See me again 4 weeks after the epidural

## 2020-10-20 NOTE — Progress Notes (Signed)
[place] Phone: [phone] Subjective:   Shelby [name], am serving as a scribe for Dr. Hulan Saas. This visit occurred during the SARS-CoV-2 public health emergency.  Safety protocols were in place, including screening questions prior to the visit, additional usage of staff PPE, and extensive cleaning of exam room while observing appropriate contact time as indicated for disinfecting solutions.   I'm seeing this patient by the request  of:  Shelby Infante, MD  CC: Low back pain follow-up  [id number]  Shelby [name] is a 45 y.o. female coming in with complaint of back and neck pain. OMT 08/14/2020. Patient states that she has been having an increase in back spasms in latissimus dorsi. Patient did try yoga this morning which helped to alleviate her pain.  Patient states that it is scheduled for the epidural next week.  Patient continues to have the dull, throbbing aching discomfort overall.  Never without some type of discomfort.  Still doing yoga most days of the week  Medications patient has been prescribed: Effexor, gabapentin intermittently  Taking: Yes      Patient's MRI in October 2020 showed that patient had chronic degenerative disc disease from L4-S1 with facet hypertrophy.   Reviewed prior external information including notes and imaging from previsou exam, outside providers and external EMR if available.   As well as notes that were available from care everywhere and other healthcare systems.  Past medical history, social, surgical and family history all reviewed in electronic medical record.  [name] information unless stated regarding to the chief complaint.   Past Medical History:  Diagnosis Date  . Anxiety   . GERD (gastroesophageal reflux disease)     [name] Known Allergies   Review of Systems:  [name] headache, visual changes, nausea, vomiting, diarrhea, constipation, dizziness,  abdominal pain, skin rash, fevers, chills, night sweats, weight loss, swollen lymph nodes, body aches, joint swelling, chest pain, shortness of breath, mood changes. POSITIVE muscle aches  Objective  Blood pressure 124/88, pulse 68, height 5\' 9"  (1.753 m), weight 183 lb (83 kg), SpO2 98 %.   General: [name] apparent distress alert and oriented x3 mood and affect normal, dressed appropriately.  HEENT: Pupils equal, extraocular movements intact  Respiratory: Patient's speak in full sentences and does not appear short of breath  Cardiovascular: [name] lower extremity edema, non tender, [name] erythema  Neuro: Cranial nerves II through XII are intact, neurovascularly intact in all extremities with 2+ DTRs and 2+ pulses.  Gait normal with good balance and coordination.  MSK:  Non tender with full range of motion and good stability and symmetric strength and tone of shoulders, elbows, wrist, hip, knee and ankles bilaterally.  Back -continued tightness noted in the paraspinal musculature from the thoracolumbar junction down to the lumbosacral area.  Seems to be right greater than left.  Worsening pain with extension.  Patient does have tightness with straight leg test but [name] true radicular symptoms today.  5 out of 5 strength in lower extremities.  Osteopathic findings  T11 extended rotated and side bent left L2 flexed rotated and side bent right Sacrum right on right       Assessment and Plan:  Acute lumbar radiculopathy Patient does not have any radicular symptoms at this time.  Discussed with patient about icing regimen, will have the epidural again but will consider the possibility of facet injections if this continues.  Do not think that this would  make significant difference but could consider.  Patient will continue the medications.  See me again 4 weeks after the epidural    Nonallopathic problems  Decision today to treat with OMT was based on Physical Exam  After verbal consent patient was  treated with  ME, FPR techniques in  thoracic, lumbar, and sacral  areas  Patient tolerated the procedure well with improvement in symptoms  Patient given exercises, stretches and lifestyle modifications  See medications in patient instructions if given  Patient will follow up in 4-8 weeks      The above documentation has been reviewed and is accurate and complete Shelby Pulley, DO       Note: This dictation was prepared with Dragon dictation along with smaller phrase technology. Any transcriptional errors that result from this process are unintentional.

## 2020-10-20 NOTE — Patient Instructions (Signed)
See me again in 6 weeks Good to see you  Get the epidural

## 2020-10-21 ENCOUNTER — Other Ambulatory Visit: Payer: 59

## 2020-10-27 ENCOUNTER — Other Ambulatory Visit: Payer: Self-pay

## 2020-10-27 ENCOUNTER — Ambulatory Visit
Admission: RE | Admit: 2020-10-27 | Discharge: 2020-10-27 | Disposition: A | Discharge status: Home / Self Care | Payer: 59 | Source: Ambulatory Visit | Attending: Family Medicine | Admitting: Family Medicine

## 2020-10-27 DIAGNOSIS — M5416 Radiculopathy, lumbar region: Secondary | ICD-10-CM

## 2020-10-27 DIAGNOSIS — M47817 Spondylosis without myelopathy or radiculopathy, lumbosacral region: Secondary | ICD-10-CM | POA: Diagnosis not present

## 2020-10-27 MED ORDER — METHYLPREDNISOLONE ACETATE 40 MG/ML INJ SUSP (RADIOLOG
120.0000 mg | Freq: Once | INTRAMUSCULAR | Status: AC
  Administered 2020-10-27: 120 mg via EPIDURAL

## 2020-10-27 MED ORDER — IOPAMIDOL (ISOVUE-M 200) INJECTION 41%
1.0000 mL | Freq: Once | INTRAMUSCULAR | Status: AC
  Administered 2020-10-27: 1 mL via EPIDURAL

## 2020-10-27 NOTE — Discharge Instructions (Signed)

## 2020-11-22 ENCOUNTER — Encounter: Payer: Self-pay | Admitting: Family Medicine

## 2020-11-23 ENCOUNTER — Other Ambulatory Visit: Payer: Self-pay

## 2020-11-23 DIAGNOSIS — E538 Deficiency of other specified B group vitamins: Secondary | ICD-10-CM | POA: Diagnosis not present

## 2020-11-23 MED ORDER — TIZANIDINE HCL 4 MG PO TABS
4.0000 mg | ORAL_TABLET | Freq: Every evening | ORAL | 1 refills | Status: DC
Start: 2020-11-23 — End: 2020-11-23

## 2020-11-23 MED FILL — tiZANidine HCL 4 MG TABS: 4 | 90 days supply | Qty: 90 | Fill #0

## 2020-11-25 DIAGNOSIS — Z0000 Encounter for general adult medical examination without abnormal findings: Secondary | ICD-10-CM | POA: Diagnosis not present

## 2020-11-25 DIAGNOSIS — R7989 Other specified abnormal findings of blood chemistry: Secondary | ICD-10-CM | POA: Diagnosis not present

## 2020-11-25 DIAGNOSIS — E538 Deficiency of other specified B group vitamins: Secondary | ICD-10-CM | POA: Diagnosis not present

## 2020-11-28 DIAGNOSIS — D7589 Other specified diseases of blood and blood-forming organs: Secondary | ICD-10-CM | POA: Diagnosis not present

## 2020-11-28 DIAGNOSIS — R030 Elevated blood-pressure reading, without diagnosis of hypertension: Secondary | ICD-10-CM | POA: Diagnosis not present

## 2020-11-28 DIAGNOSIS — Z1331 Encounter for screening for depression: Secondary | ICD-10-CM | POA: Diagnosis not present

## 2020-11-28 DIAGNOSIS — Z23 Encounter for immunization: Secondary | ICD-10-CM | POA: Diagnosis not present

## 2020-11-28 DIAGNOSIS — R002 Palpitations: Secondary | ICD-10-CM | POA: Diagnosis not present

## 2020-11-28 DIAGNOSIS — R82998 Other abnormal findings in urine: Secondary | ICD-10-CM | POA: Diagnosis not present

## 2020-11-28 DIAGNOSIS — Z0000 Encounter for general adult medical examination without abnormal findings: Secondary | ICD-10-CM | POA: Diagnosis not present

## 2020-11-28 DIAGNOSIS — E538 Deficiency of other specified B group vitamins: Secondary | ICD-10-CM | POA: Diagnosis not present

## 2020-11-28 NOTE — Progress Notes (Signed)
[facility] [address] [place] Phone: [phone] Subjective:   I [name] am serving as a Education administrator for Dr. Hulan Saas.  This visit occurred during the SARS-CoV-2 public health emergency.  Safety protocols were in place, including screening questions prior to the visit, additional usage of staff PPE, and extensive cleaning of exam room while observing appropriate contact time as indicated for disinfecting solutions.   I'm seeing this patient by the request  of:  Crist Infante, MD  CC: Low back pain follow-up  [id number]  Shelby [name] is a 45 y.o. female coming in with complaint of back and neck pain. OMT 10/20/2020. Patient states she has been doing well.  Patient states that the last epidural has been much more helpful recently.  Patient has been able to workout on a more regular basis again.  Patient denies though any significant radiation at the moment.  Patient did have work-up by primary care provider and was found to have B12 deficiency that was fairly severe.  Has not noticed a change yet at this time.  Medications patient has been prescribed: Zanaflex, Effexor  Taking: Yes         Reviewed prior external information including notes and imaging from previsou exam, outside providers and external EMR if available.   As well as notes that were available from care everywhere and other healthcare systems.  Past medical history, social, surgical and family history all reviewed in electronic medical record.  No pertanent information unless stated regarding to the chief complaint.   Past Medical History:  Diagnosis Date  . Anxiety   . GERD (gastroesophageal reflux disease)     No Known Allergies   Review of Systems:  No headache, visual changes, nausea, vomiting, diarrhea, constipation, dizziness, abdominal pain, skin rash, fevers, chills, night sweats, weight loss, swollen lymph nodes, body aches, joint swelling,  chest pain, shortness of breath, mood changes. POSITIVE muscle aches  Objective  Blood pressure (!) 122/92, pulse 92, height 5\' 9"  (1.753 m), weight 185 lb (83.9 kg), SpO2 98 %.   General: No apparent distress alert and oriented x3 mood and affect normal, dressed appropriately.  HEENT: Pupils equal, extraocular movements intact  Respiratory: Patient's speak in full sentences and does not appear short of breath  Cardiovascular: No lower extremity edema, non tender, no erythema  Neuro: Cranial nerves II through XII are intact, neurovascularly intact in all extremities with 2+ DTRs and 2+ pulses.  Gait normal with good balance and coordination.  MSK:  Non tender with full range of motion and good stability and symmetric strength and tone of shoulders, elbows, wrist, hip, knee and ankles bilaterally.  Back -low back exam does have some loss of lordosis.  Still tender to palpation over the sacroiliac joint bilaterally.  Tenderness over the piriformis bilaterally as well.  Negative straight leg test noted but still has some tightness with straight leg test.  Osteopathic findings  C7 flexed rotated and side bent left T3 extended rotated and side bent right inhaled rib T9 extended rotated and side bent left L2 flexed rotated and side bent right Sacrum right on right       Assessment and Plan:  Acute lumbar radiculopathy Patient does have some radicular symptoms previously.  He seems to be doing a little better since patient's last epidural.  Discussed with patient icing regimen and home exercises.  Continue to be active otherwise.  Has been doing the decompression on her own  with her inversion table.  Follow-up with pain 5 to 6 weeks.  Continue medications including Effexor.  B12 deficiency Does have B12 deficiency from primary care provider. Consider B6 supplementation.  Patient is being worked up for pernicious anemia.  Patient is to get a redraw in 4 months she states.    Nonallopathic  problems  Decision today to treat with OMT was based on Physical Exam  After verbal consent patient was treated with HVLA, ME, FPR techniques in cervical, rib, thoracic, lumbar, and sacral  areas  Patient tolerated the procedure well with improvement in symptoms  Patient given exercises, stretches and lifestyle modifications  See medications in patient instructions if given  Patient will follow up in 4-8 weeks      The above documentation has been reviewed and is accurate and complete Lyndal Pulley, DO       Note: This dictation was prepared with Dragon dictation along with smaller phrase technology. Any transcriptional errors that result from this process are unintentional.

## 2020-12-01 ENCOUNTER — Encounter: Payer: Self-pay | Admitting: Family Medicine

## 2020-12-01 ENCOUNTER — Ambulatory Visit: Payer: 59 | Admitting: Family Medicine

## 2020-12-01 ENCOUNTER — Other Ambulatory Visit: Payer: Self-pay

## 2020-12-01 VITALS — BP 122/92 | HR 92 | Ht 69.0 in | Wt 185.0 lb

## 2020-12-01 DIAGNOSIS — E538 Deficiency of other specified B group vitamins: Secondary | ICD-10-CM

## 2020-12-01 DIAGNOSIS — M5416 Radiculopathy, lumbar region: Secondary | ICD-10-CM

## 2020-12-01 DIAGNOSIS — M999 Biomechanical lesion, unspecified: Secondary | ICD-10-CM

## 2020-12-01 DIAGNOSIS — R030 Elevated blood-pressure reading, without diagnosis of hypertension: Secondary | ICD-10-CM | POA: Diagnosis not present

## 2020-12-01 NOTE — Patient Instructions (Signed)
Good to see you B6 100 mg with B12 Glad you are feeling better Good luck with your friend See me again in 4-6 weeks

## 2020-12-01 NOTE — Assessment & Plan Note (Signed)
Does have B12 deficiency from primary care provider. Consider B6 supplementation.  Patient is being worked up for pernicious anemia.  Patient is to get a redraw in 4 months she states.

## 2020-12-01 NOTE — Assessment & Plan Note (Signed)
Patient does have some radicular symptoms previously.  He seems to be doing a little better since patient's last epidural.  Discussed with patient icing regimen and home exercises.  Continue to be active otherwise.  Has been doing the decompression on her own with her inversion table.  Follow-up with pain 5 to 6 weeks.  Continue medications including Effexor.

## 2020-12-05 DIAGNOSIS — R030 Elevated blood-pressure reading, without diagnosis of hypertension: Secondary | ICD-10-CM | POA: Diagnosis not present

## 2020-12-06 ENCOUNTER — Other Ambulatory Visit (HOSPITAL_COMMUNITY): Payer: Self-pay | Admitting: Internal Medicine

## 2020-12-06 MED FILL — TELMISARTAN 40 MG TABS: 40 | 30 days supply | Qty: 30 | Fill #0

## 2020-12-21 ENCOUNTER — Other Ambulatory Visit (HOSPITAL_COMMUNITY): Payer: Self-pay | Admitting: Internal Medicine

## 2020-12-28 NOTE — Progress Notes (Unsigned)
[facility] [address] [place] Phone: [phone] Subjective:   I Shelby [name] am serving as a Education administrator for Dr. Hulan Saas.  This visit occurred during the SARS-CoV-2 public health emergency.  Safety protocols were in place, including screening questions prior to the visit, additional usage of staff PPE, and extensive cleaning of exam room while observing appropriate contact time as indicated for disinfecting solutions.   I'm seeing this patient by the request  of:  Crist Infante, MD  CC: Low back pain follow-up  [id number]  Shelby [name] is a 45 y.o. female coming in with complaint of back and neck pain. OMT 12/01/2020. Patient states she is doing well today.  Patient was able to play tennis recently.  Doing relatively well.  Medications patient has been prescribed: Effexor  Taking: Yes         Reviewed prior external information including notes and imaging from previsou exam, outside providers and external EMR if available.   As well as notes that were available from care everywhere and other healthcare systems.  Past medical history, social, surgical and family history all reviewed in electronic medical record.  No pertanent information unless stated regarding to the chief complaint.   Past Medical History:  Diagnosis Date  . Anxiety   . GERD (gastroesophageal reflux disease)     No Known Allergies   Review of Systems:  No headache, visual changes, nausea, vomiting, diarrhea, constipation, dizziness, abdominal pain, skin rash, fevers, chills, night sweats, weight loss, swollen lymph nodes, body aches, joint swelling, chest pain, shortness of breath, mood changes. POSITIVE muscle aches  Objective  Blood pressure 120/90, pulse 85, height 5\' 9"  (1.753 m), weight 186 lb (84.4 kg), SpO2 99 %.   General: No apparent distress alert and oriented x3 mood and affect normal, dressed appropriately.  HEENT: Pupils equal,  extraocular movements intact  Respiratory: Patient's speak in full sentences and does not appear short of breath  Cardiovascular: No lower extremity edema, non tender, no erythema  Gait normal with good balance and coordination.  MSK:  Non tender with full range of motion and good stability and symmetric strength and tone of shoulders, elbows, wrist, hip, knee and ankles bilaterally.  Back -low back exam does have tightness noted.  Tightness noted in the paraspinal musculature in the lumbar spine.  Seems to be a little bit more right greater than left.  Negative straight leg test.  Mild tightness with FABER test.  Osteopathic findings  T9 extended rotated and side bent right  L2 flexed rotated and side bent right Sacrum right on right       Assessment and Plan:  B12 deficiency Patient given injection today.  Usually gets it from her primary care provider's office.  Patient was late to have a flare.  We discussed potential further work-up.  Patient will have it rechecked in a couple months.  Follow-up with me again in 6 weeks.  SI (sacroiliac) joint dysfunction Patient's pain seems to be more secondary to the sacroiliac joint and lumbar radiculopathy.  He does have the tightness of the hip flexors.  Patient does not have any significant spasm going.  He does have the muscle relaxer if needed.  Continues to gabapentin.  Continues the Effexor.  Patient did not want to try a different anti-inflammatory and given Celebrex.  Warned of potential side effects and watch for any type of stomach pain.  Follow-up again in 4 to 8 weeks  Nonallopathic problems  Decision today to treat with OMT was based on Physical Exam  After verbal consent patient was treated with HVLA, ME, FPR techniques in  thoracic, lumbar, and sacral  areas  Patient tolerated the procedure well with improvement in symptoms  Patient given exercises, stretches and lifestyle modifications  See medications in patient  instructions if given  Patient will follow up in 4-8 weeks      The above documentation has been reviewed and is accurate and complete Lyndal Pulley, DO       Note: This dictation was prepared with Dragon dictation along with smaller phrase technology. Any transcriptional errors that result from this process are unintentional.

## 2020-12-29 ENCOUNTER — Other Ambulatory Visit: Payer: Self-pay

## 2020-12-29 ENCOUNTER — Other Ambulatory Visit: Payer: Self-pay | Admitting: Family Medicine

## 2020-12-29 ENCOUNTER — Encounter: Payer: Self-pay | Admitting: Family Medicine

## 2020-12-29 ENCOUNTER — Ambulatory Visit: Payer: 59 | Admitting: Family Medicine

## 2020-12-29 VITALS — BP 120/90 | HR 85 | Ht 69.0 in | Wt 186.0 lb

## 2020-12-29 DIAGNOSIS — E538 Deficiency of other specified B group vitamins: Secondary | ICD-10-CM

## 2020-12-29 DIAGNOSIS — M533 Sacrococcygeal disorders, not elsewhere classified: Secondary | ICD-10-CM | POA: Diagnosis not present

## 2020-12-29 DIAGNOSIS — M999 Biomechanical lesion, unspecified: Secondary | ICD-10-CM | POA: Diagnosis not present

## 2020-12-29 MED ORDER — CELECOXIB 100 MG PO CAPS: 100.0000 mg | ORAL_CAPSULE | Freq: Two times a day (BID) | ORAL | 3 refills | Status: DC

## 2020-12-29 MED ORDER — CYANOCOBALAMIN 1000 MCG/ML IJ SOLN
1000.0000 ug | Freq: Once | INTRAMUSCULAR | Status: AC
  Administered 2020-12-29: 1000 ug via INTRAMUSCULAR

## 2020-12-29 MED FILL — CELECOXIB 100 MG CAP: 100 | 30 days supply | Qty: 60 | Fill #0

## 2020-12-29 NOTE — Assessment & Plan Note (Signed)
Patient given injection today.  Usually gets it from her primary care provider's office.  Patient was late to have a flare.  We discussed potential further work-up.  Patient will have it rechecked in a couple months.  Follow-up with me again in 6 weeks.

## 2020-12-29 NOTE — Patient Instructions (Addendum)
Good to see you B12 injection today Celebrex 100 mg 2 times a day No other antiinflammatory with it Continue everything else See me again in 5-6 weeks

## 2020-12-29 NOTE — Assessment & Plan Note (Signed)
Patient's pain seems to be more secondary to the sacroiliac joint and lumbar radiculopathy.  He does have the tightness of the hip flexors.  Patient does not have any significant spasm going.  He does have the muscle relaxer if needed.  Continues to gabapentin.  Continues the Effexor.  Patient did not want to try a different anti-inflammatory and given Celebrex.  Warned of potential side effects and watch for any type of stomach pain.  Follow-up again in 4 to 8 weeks

## 2021-01-10 ENCOUNTER — Other Ambulatory Visit (HOSPITAL_COMMUNITY): Payer: Self-pay

## 2021-01-10 MED FILL — Telmisartan Tab 40 MG: ORAL | 30 days supply | Qty: 30 | Fill #0 | Status: AC

## 2021-01-24 ENCOUNTER — Ambulatory Visit (INDEPENDENT_AMBULATORY_CARE_PROVIDER_SITE_OTHER): Payer: 59

## 2021-01-24 ENCOUNTER — Encounter: Payer: Self-pay | Admitting: Cardiology

## 2021-01-24 ENCOUNTER — Other Ambulatory Visit: Payer: Self-pay

## 2021-01-24 ENCOUNTER — Ambulatory Visit: Payer: 59 | Admitting: Cardiology

## 2021-01-24 VITALS — BP 132/88 | HR 69 | Ht 69.0 in | Wt 180.0 lb

## 2021-01-24 DIAGNOSIS — R002 Palpitations: Secondary | ICD-10-CM

## 2021-01-24 DIAGNOSIS — R030 Elevated blood-pressure reading, without diagnosis of hypertension: Secondary | ICD-10-CM

## 2021-01-24 DIAGNOSIS — R000 Tachycardia, unspecified: Secondary | ICD-10-CM | POA: Insufficient documentation

## 2021-01-24 NOTE — Patient Instructions (Addendum)
Medication Instructions:  Your physician recommends that you continue on your current medications as directed. Please refer to the Current Medication list given to you today. *If you need a refill on your cardiac medications before your next appointment, please call your pharmacy*  Lab Work: None ordered. If you have labs (blood work) drawn today and your tests are completely normal, you will receive your results only by: Marland Kitchen MyChart Message (if you have MyChart) OR . A paper copy in the mail If you have any lab test that is abnormal or we need to change your treatment, we will call you to review the results.  Testing/Procedures: Your physician has requested that you have an echocardiogram. Echocardiography is a painless test that uses sound waves to create images of your heart. It provides your doctor with information about the size and shape of your heart and how well your heart's chambers and valves are working. This procedure takes approximately one hour. There are no restrictions for this procedure. . Please schedule for ECHO  Your physician has recommended that you wear a holter monitor. Holter monitors are medical devices that record the heart's electrical activity. Doctors most often use these monitors to diagnose arrhythmias. Arrhythmias are problems with the speed or rhythm of the heartbeat. The monitor is a small, portable device. You can wear one while you do your normal daily activities. This is usually used to diagnose what is causing palpitations/syncope (passing out).  You will wear a 14 day ZIO monitor  Follow-Up: At [facility], you and your health needs are our priority.  As part of our continuing mission to provide you with exceptional heart care, we have created designated Provider Care Teams.  These Care Teams include your primary Cardiologist (physician) and Advanced Practice Providers (APPs -  Physician Assistants and Nurse Practitioners) who all work together to provide  you with the care you need, when you need it.  Your next appointment:   Your physician wants you to follow-up in: 6 weeks with Dr. Quentin Ore virtual visit (telephone call).  Your physician has recommended that you wear a Zio monitor.   This monitor is a medical device that records the heart's electrical activity. Doctors most often use these monitors to diagnose arrhythmias. Arrhythmias are problems with the speed or rhythm of the heartbeat. The monitor is a small device applied to your chest. You can wear one while you do your normal daily activities. While wearing this monitor if you have any symptoms to push the button and record what you felt. Once you have worn this monitor for the period of time provider prescribed (Usually 14 days), you will return the monitor device in the postage paid box. Once it is returned they will download the data collected and provide [place] with a report which the provider will then review and we will call you with those results. Important tips:  1. Avoid showering during the first 24 hours of wearing the monitor. 2. Avoid excessive sweating to help maximize wear time. 3. Do not submerge the device, no hot tubs, and no swimming pools. 4. Keep any lotions or oils away from the patch. 5. After 24 hours you may shower with the patch on. Take brief showers with your back facing the shower head.  6. Do not remove patch once it has been placed because that will interrupt data and decrease adhesive wear time. 7. Push the button when you have any symptoms and write down what you were feeling. 8. Once you  have completed wearing your monitor, remove and place into box which has postage paid and place in your outgoing mailbox.  9. If for some reason you have misplaced your box then call our office and we can provide another box and/or mail it off for you.

## 2021-01-24 NOTE — [place] Notes (Signed)
Electrophysiology Office Note:    Date:  01/24/2021   ID:  Shelby [name], DOB [birth year], MRN [id number]  PCP:  Crist Infante, MD  [place] Cardiologist:  No primary care provider on file.  [facility] Electrophysiologist:  Vickie Epley, MD   Referring MD: Crist Infante, MD   Chief Complaint: Palpitations and tachycardia  History of Present Illness:    Shelby [name] is a 45 y.o. female who presents for an evaluation of palpitations and tachycardia at the request of Dr. Joylene Draft. Their medical history includes elevated blood pressure and COVID-19 infection in November 2021.  Patient tells me that she was fairly sick with COVID for several days but did not require hospitalization.  Since her COVID-19 infection, the patient reports an intermittent abnormal heartbeat in the center of her chest.  She describes a pushing against her chest when she experiences the palpitations.  No syncope or presyncope.  Patient also has noted an increased heart rate when she exerts herself.  This has been most prominent when she is doing hot yoga.  Her heart rates will get into the 180s which is significantly higher than her heart rates prior to the COVID infection.  She takes Effexor but has not increase this dose recently.  In fact the dose has decreased.  There have been no other significant changes to her medication regimen that would align with the onset of the palpitations.  Shelby [name] is with her husband today in clinic who is a GI physician with [employer].   Past Medical History:  Diagnosis Date  . Anxiety   . GERD (gastroesophageal reflux disease)     Past Surgical History:  Procedure Laterality Date  . Cyst remove  12/2009  . INTRAUTERINE DEVICE INSERTION  12/2009    Current Medications: Current Meds  Medication Sig  . celecoxib (CELEBREX) 100 MG capsule TAKE 1 CAPSULE (100 MG TOTAL) BY MOUTH 2 (TWO) TIMES DAILY.  Marland Kitchen gabapentin (NEURONTIN) 100 MG capsule Take 2 capsules (200 mg total)  by mouth at bedtime. (Patient taking differently: Take 200 mg by mouth 3 times/day as needed-between meals & bedtime.)  . levonorgestrel (MIRENA) 20 MCG/24HR IUD 1 Intra Uterine Device (1 each total) by Intrauterine route once.  . potassium chloride SA (K-DUR,KLOR-CON) 20 MEQ tablet Take 0.5 tablets (10 mEq total) by mouth daily.  Marland Kitchen telmisartan (MICARDIS) 40 MG tablet TAKE 1 TABLET BY MOUTH AT BEDTIME AS DIRECTED  . tiZANidine (ZANAFLEX) 4 MG tablet TAKE 1 TABLET (4 MG TOTAL) BY MOUTH NIGHTLY.  . traMADol (ULTRAM) 50 MG tablet Take 1 tablet (50 mg total) by mouth every 12 (twelve) hours as needed.  . venlafaxine XR (EFFEXOR-XR) 37.5 MG 24 hr capsule TAKE 1 CAPSULE (37.5 MG TOTAL) BY MOUTH DAILY WITH BREAKFAST.     Allergies:   Patient has no known allergies.   Social History   Socioeconomic History  . Marital status: Married    Spouse name: Not on file  . Number of children: Not on file  . Years of education: Not on file  . Highest education level: Not on file  Occupational History  . Not on file  Tobacco Use  . Smoking status: Never Smoker  . Smokeless tobacco: Never Used  . 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 Vaping Use: Never used  Substance and Sexual Activity  . Alcohol use: Yes  . Drug  use: No  . Sexual activity: Not on file  Other Topics Concern  . Not on file  Social History Narrative  . Not on file   Social Determinants of Health   Financial Resource Strain: Not on file  Food Insecurity: Not on file  Transportation Needs: Not on file  Physical Activity: Not on file  Stress: Not on file  Social Connections: Not on file     Family History: The patient's family history includes Bipolar disorder in her sister; Depression in her mother; Hyperlipidemia in her mother; Hypertension in her father and mother.  ROS:   Please see the history of present illness.    All other systems  reviewed and are negative.  EKGs/Labs/Other Studies Reviewed:    The following studies were reviewed today:  Records from Dr. Silvestre Mesi office February 2022 lab work TSH 1.14 LDL 66 HDL 71 Total cholesterol 159 Hemoglobin 14.5 Glucose 70 Creatinine 0.7  EKG:  The ekg ordered today demonstrates sinus rhythm.  No evidence of preexcitation.  Intervals are normal.  Recent Labs: No results found for requested labs within last 8760 hours.  Recent Lipid Panel    Component Value Date/Time   CHOL 135 07/28/2012 0948   TRIG 46.0 07/28/2012 0948   HDL 56.60 07/28/2012 0948   CHOLHDL 2 07/28/2012 0948   VLDL 9.2 07/28/2012 0948   LDLCALC 69 07/28/2012 0948    Physical Exam:    VS:  BP 132/88 (BP Location: Left Arm, Patient Position: Sitting, Cuff Size: Normal)   Pulse 69   Ht 5\' 9"  (1.753 m)   Wt 180 lb (81.6 kg)   SpO2 97%   BMI 26.58 kg/m     Wt Readings from Last 3 Encounters:  01/24/21 180 lb (81.6 kg)  12/29/20 186 lb (84.4 kg)  12/01/20 185 lb (83.9 kg)     GEN:  Well nourished, well developed in no acute distress HEENT: Normal NECK: No JVD. LYMPHATICS: No lymphadenopathy CARDIAC: RRR, no murmurs, rubs, gallops RESPIRATORY:  Clear to auscultation without rales, wheezing or rhonchi  ABDOMEN: Soft, non-tender, non-distended MUSCULOSKELETAL:  No edema; No deformity  SKIN: Warm and dry NEUROLOGIC:  Alert and oriented x 3 PSYCHIATRIC:  Normal affect   ASSESSMENT:    1. Palpitations   2. Elevated blood pressure reading    PLAN:    In order of problems listed above:  1.  Palpitations  the patient has relatively recent onset of palpitations.  There seems to be to distinct processes.  First, she has intermittent palpitations that seem to be consistent with symptomatic PVCs with a post PVC increased stroke-volume.  It is also possible these are PACs or not related to cardiac rhythm abnormality.  Second, she has an elevated heart rate with exertion which is new  since her COVID-19 infection in November.  I suspect this represents an element of autonomic dysfunction which we have been seeing more of after COVID-19 infections.  To help evaluate both of these, we will plan for a 14-day ZIO monitor.  This will allow [place] to evaluate for the burden of PACs and PVCs and to help correlate symptoms with these abnormal beats.  We will also allow [place] to assess her average heart rates during the awake/asleep hours to determine whether or not there could be an element of autonomic instability causing her symptoms.  For now, I would recommend that she continue with her activities without limitation.  I think she should continue to exercise as she has been  doing.  I do not think medications are indicated at this time.  We will plan to follow-up after her heart monitor results are back.  I would also like to evaluate the cardiac structure and function with an echocardiogram to confirm no evidence of myocarditis given her recent COVID infection.  2.  Elevated blood pressure readings Has recently been started on telmisartan 40 mg by mouth once daily.  Her blood pressure today in clinic is acceptable.  I will plan to see her back in about 6 weeks via a virtual visit or sooner as needed.  Medication Adjustments/Labs and Tests Ordered: Current medicines are reviewed at length with the patient today.  Concerns regarding medicines are outlined above.  Orders Placed This Encounter  Procedures  . LONG TERM MONITOR (3-14 DAYS)  . EKG 12-Lead  . ECHOCARDIOGRAM COMPLETE   No orders of the defined types were placed in this encounter.    Signed, Hilton Cork. Quentin Ore, MD, [facility], [facility] 01/24/2021 7:41 PM    Electrophysiology [facility]

## 2021-01-29 DIAGNOSIS — R002 Palpitations: Secondary | ICD-10-CM

## 2021-02-01 NOTE — [place] Notes (Signed)
[place] [address] [place] Phone: [phone] Subjective:   I Shelby [name] am serving as a Education administrator for Dr. Hulan Saas.  This visit occurred during the SARS-CoV-2 public health emergency.  Safety protocols were in place, including screening questions prior to the visit, additional usage of staff PPE, and extensive cleaning of exam room while observing appropriate contact time as indicated for disinfecting solutions.   I'm seeing this patient by the request  of:  Shelby Infante, MD  CC: Low back and neck pain  [id number]  Shelby [name] is a 45 y.o. female coming in with complaint of back and neck pain. OMT 12/29/2020. Patient states she is doing well.  Patient has noticed more tightness than anything else.  Patient has been attempting to lose weight and is doing a great job.  Patient has been doing a lot of yoga which has seemed to be more beneficial.  Medications patient has been prescribed: Vit D          Reviewed prior external information including notes and imaging from previsou exam, outside providers and external EMR if available.   As well as notes that were available from care everywhere and other healthcare systems.  Past medical history, social, surgical and family history all reviewed in electronic medical record.  No pertanent information unless stated regarding to the chief complaint.   Past Medical History:  Diagnosis Date  . Anxiety   . GERD (gastroesophageal reflux disease)     No Known Allergies   Review of Systems:  No headache, visual changes, nausea, vomiting, diarrhea, constipation, dizziness, abdominal pain, skin rash, fevers, chills, night sweats, weight loss, swollen lymph nodes, body aches, joint swelling, chest pain, shortness of breath, mood changes. POSITIVE muscle aches but seems to be a little bit less than usual  Objective  Blood pressure 140/90, pulse 69, height 5\' 9"  (1.753 m),  weight 181 lb (82.1 kg), SpO2 99 %.   General: No apparent distress alert and oriented x3 mood and affect normal, dressed appropriately.  HEENT: Pupils equal, extraocular movements intact  Respiratory: Patient's speak in full sentences and does not appear short of breath  Cardiovascular: No lower extremity edema, non tender, no erythema  Gait normal with good balance and coordination.  MSK:  Non tender with full range of motion and good stability and symmetric strength and tone of shoulders, elbows, wrist, hip, knee and ankles bilaterally.  Back -low back exam does show the patient does have some mild loss of lordosis.  Patient does have some very mild tenderness noted in the thoracolumbar juncture as well as over the lumbosacral area.  Patient has a negative straight leg test which is good.  Patient noted does seem to be sore with FABER test bilaterally.  Osteopathic findings  T9 extended rotated and side bent left L2 flexed rotated and side bent right Sacrum right on right       Assessment and Plan:  SI (sacroiliac) joint dysfunction Pain today seems to be more secondary to the sacroiliac.  No significant lumbar radiculopathy.  Patient though has had fairly good success with epidurals and is still trying to avoid any type of surgical intervention.  Patient has made progress and now has actually had weight loss which I encouraged her to continue.  Patient will continue to be active and follow-up with me again 6 weeks    Nonallopathic problems  Decision today to treat with OMT was based on Physical  Exam  After verbal consent patient was treated with HVLA, ME, FPR techniques in  thoracic, lumbar, and sacral  areas  Patient tolerated the procedure well with improvement in symptoms  Patient given exercises, stretches and lifestyle modifications  See medications in patient instructions if given  Patient will follow up in 4-8 weeks      The above documentation has been reviewed  and is accurate and complete Lyndal Pulley, DO       Note: This dictation was prepared with Dragon dictation along with smaller phrase technology. Any transcriptional errors that result from this process are unintentional.

## 2021-02-02 ENCOUNTER — Other Ambulatory Visit (HOSPITAL_COMMUNITY): Payer: Self-pay

## 2021-02-02 ENCOUNTER — Other Ambulatory Visit: Payer: Self-pay

## 2021-02-02 ENCOUNTER — Encounter: Payer: Self-pay | Admitting: Family Medicine

## 2021-02-02 ENCOUNTER — Ambulatory Visit: Payer: 59 | Admitting: Family Medicine

## 2021-02-02 VITALS — BP 140/90 | HR 69 | Ht 69.0 in | Wt 181.0 lb

## 2021-02-02 DIAGNOSIS — M9904 Segmental and somatic dysfunction of sacral region: Secondary | ICD-10-CM | POA: Diagnosis not present

## 2021-02-02 DIAGNOSIS — M9902 Segmental and somatic dysfunction of thoracic region: Secondary | ICD-10-CM

## 2021-02-02 DIAGNOSIS — M9903 Segmental and somatic dysfunction of lumbar region: Secondary | ICD-10-CM | POA: Diagnosis not present

## 2021-02-02 DIAGNOSIS — M533 Sacrococcygeal disorders, not elsewhere classified: Secondary | ICD-10-CM

## 2021-02-02 MED FILL — Celecoxib Cap 100 MG: ORAL | 30 days supply | Qty: 60 | Fill #0 | Status: AC

## 2021-02-02 NOTE — Patient Instructions (Signed)
Good to see you Keep working you look great See me again in 5-6 weeks

## 2021-02-02 NOTE — Assessment & Plan Note (Signed)
Pain today seems to be more secondary to the sacroiliac.  No significant lumbar radiculopathy.  Patient though has had fairly good success with epidurals and is still trying to avoid any type of surgical intervention.  Patient has made progress and now has actually had weight loss which I encouraged her to continue.  Patient will continue to be active and follow-up with me again 6 weeks

## 2021-02-16 DIAGNOSIS — R002 Palpitations: Secondary | ICD-10-CM | POA: Diagnosis not present

## 2021-02-19 MED FILL — Telmisartan Tab 40 MG: ORAL | 30 days supply | Qty: 30 | Fill #1 | Status: AC

## 2021-02-20 ENCOUNTER — Other Ambulatory Visit (HOSPITAL_COMMUNITY): Payer: Self-pay

## 2021-02-27 ENCOUNTER — Encounter: Payer: Self-pay | Admitting: Family Medicine

## 2021-02-28 ENCOUNTER — Other Ambulatory Visit (HOSPITAL_COMMUNITY): Payer: Self-pay

## 2021-02-28 ENCOUNTER — Other Ambulatory Visit: Payer: Self-pay

## 2021-02-28 MED ORDER — VENLAFAXINE HCL ER 37.5 MG PO CP24
37.5000 mg | ORAL_CAPSULE | Freq: Every day | ORAL | 0 refills | Status: DC
  Filled 2021-02-28: qty 90, 90d supply, fill #0

## 2021-02-28 MED ORDER — TIZANIDINE HCL 4 MG PO TABS
4.0000 mg | ORAL_TABLET | Freq: Every evening | ORAL | 1 refills | Status: DC
  Filled 2021-02-28: qty 90, 90d supply, fill #0
  Filled 2021-06-04: qty 90, 90d supply, fill #1

## 2021-03-02 ENCOUNTER — Other Ambulatory Visit: Payer: Self-pay

## 2021-03-02 ENCOUNTER — Ambulatory Visit (HOSPITAL_COMMUNITY): Payer: 59 | Attending: Cardiology

## 2021-03-02 DIAGNOSIS — R002 Palpitations: Secondary | ICD-10-CM | POA: Insufficient documentation

## 2021-03-02 LAB — ECHOCARDIOGRAM COMPLETE
Area-P 1/2: 3.48 cm2
S' Lateral: 3.1 cm

## 2021-03-13 ENCOUNTER — Other Ambulatory Visit (HOSPITAL_COMMUNITY): Payer: Self-pay

## 2021-03-13 MED FILL — Celecoxib Cap 100 MG: ORAL | 30 days supply | Qty: 60 | Fill #1 | Status: AC

## 2021-03-14 ENCOUNTER — Other Ambulatory Visit (HOSPITAL_COMMUNITY): Payer: Self-pay

## 2021-03-14 DIAGNOSIS — E538 Deficiency of other specified B group vitamins: Secondary | ICD-10-CM | POA: Diagnosis not present

## 2021-03-14 DIAGNOSIS — R059 Cough, unspecified: Secondary | ICD-10-CM | POA: Diagnosis not present

## 2021-03-14 DIAGNOSIS — J069 Acute upper respiratory infection, unspecified: Secondary | ICD-10-CM | POA: Diagnosis not present

## 2021-03-14 DIAGNOSIS — J029 Acute pharyngitis, unspecified: Secondary | ICD-10-CM | POA: Diagnosis not present

## 2021-03-14 DIAGNOSIS — Z1152 Encounter for screening for COVID-19: Secondary | ICD-10-CM | POA: Diagnosis not present

## 2021-03-14 DIAGNOSIS — I10 Essential (primary) hypertension: Secondary | ICD-10-CM | POA: Diagnosis not present

## 2021-03-14 MED ORDER — CEFDINIR 300 MG PO CAPS
300.0000 mg | ORAL_CAPSULE | Freq: Two times a day (BID) | ORAL | 0 refills | Status: DC
Start: 2021-03-14 — End: 2021-11-29
  Filled 2021-03-14: qty 14, 7d supply, fill #0

## 2021-03-15 NOTE — [place] Notes (Signed)
[place] [address] [place] Phone: [phone] Subjective:   I [name] am serving as a Education administrator for Dr. Hulan Saas.  This visit occurred during the SARS-CoV-2 public health emergency.  Safety protocols were in place, including screening questions prior to the visit, additional usage of staff PPE, and extensive cleaning of exam room while observing appropriate contact time as indicated for disinfecting solutions.  I'm seeing this patient by the request  of:  Crist Infante, MD  CC: Low back pain follow-up  [id number]  Shelby [name] is a 45 y.o. female coming in with complaint of back and neck pain. OMT 02/02/2021. Patient states   Medications patient has been prescribed: Effexor, Celebrex, Zanaflex  Taking: Yes         Reviewed prior external information including notes and imaging from previsou exam, outside providers and external EMR if available.   As well as notes that were available from care everywhere and other healthcare systems.  Past medical history, social, surgical and family history all reviewed in electronic medical record.  No pertanent information unless stated regarding to the chief complaint.   Past Medical History:  Diagnosis Date   Anxiety    GERD (gastroesophageal reflux disease)     No Known Allergies   Review of Systems:  No headache, visual changes, nausea, vomiting, diarrhea, constipation, dizziness, abdominal pain, skin rash, fevers, chills, night sweats, weight loss, swollen lymph nodes, joint swelling, chest pain, shortness of breath, mood changes. POSITIVE muscle aches, body aches  Objective  Blood pressure 140/90, height 5\' 9"  (1.753 m), weight 181 lb (82.1 kg).   General: No apparent distress alert and oriented x3 mood and affect normal, dressed appropriately.  HEENT: Pupils equal, extraocular movements intact  Respiratory: Patient's speak in full sentences and does not appear  short of breath  Cardiovascular: No lower extremity edema, non tender, no erythema  Gait normal with good balance and coordination.  MSK:  Non tender with full range of motion and good stability and symmetric strength and tone of shoulders, elbows, wrist, hip, knee and ankles bilaterally.  Back -tightness noted over the sacroiliac joint on the right side as well as the paraspinal musculature.  No significant spasm noted though.  Still has tightness in the piriformis.  Positive FABER test.  5 out of 5 strength of the lower extremities.  Improvement in core strength.  Osteopathic findings  T8 extended rotated and side bent left L2 flexed rotated and side bent right Sacrum right on right     Assessment and Plan:  SI (sacroiliac) joint dysfunction Patient's pain seems to be more of the sacroiliac joint at this time.  Discussed posture and ergonomics.  Patient has no significant radicular symptoms at the moment.  Patient does take the gabapentin, Zanaflex, Effexor.  Continue with him at the moment.  Patient is staying very active which is good.  Follow-up again in 6 to 8 weeks   Nonallopathic problems  Decision today to treat with OMT was based on Physical Exam  After verbal consent patient was treated with HVLA, ME, FPR techniques in thoracic, lumbar, and sacral  areas  Patient tolerated the procedure well with improvement in symptoms  Patient given exercises, stretches and lifestyle modifications  See medications in patient instructions if given  Patient will follow up in 4-8 weeks      The above documentation has been reviewed and is accurate and complete Lyndal Pulley, DO  Note: This dictation was prepared with Dragon dictation along with smaller phrase technology. Any transcriptional errors that result from this process are unintentional.

## 2021-03-16 ENCOUNTER — Other Ambulatory Visit: Payer: Self-pay

## 2021-03-16 ENCOUNTER — Ambulatory Visit: Payer: 59 | Admitting: Family Medicine

## 2021-03-16 ENCOUNTER — Encounter: Payer: Self-pay | Admitting: Family Medicine

## 2021-03-16 DIAGNOSIS — M999 Biomechanical lesion, unspecified: Secondary | ICD-10-CM | POA: Diagnosis not present

## 2021-03-16 DIAGNOSIS — M533 Sacrococcygeal disorders, not elsewhere classified: Secondary | ICD-10-CM | POA: Diagnosis not present

## 2021-03-16 NOTE — Patient Instructions (Signed)
Good to see you Have fun on the boat See me again in 4-6 weeks

## 2021-03-16 NOTE — Assessment & Plan Note (Signed)
Patient's pain seems to be more of the sacroiliac joint at this time.  Discussed posture and ergonomics.  Patient has no significant radicular symptoms at the moment.  Patient does take the gabapentin, Zanaflex, Effexor.  Continue with him at the moment.  Patient is staying very active which is good.  Follow-up again in 6 to 8 weeks

## 2021-03-17 DIAGNOSIS — Z20828 Contact with and (suspected) exposure to other viral communicable diseases: Secondary | ICD-10-CM | POA: Diagnosis not present

## 2021-03-20 MED FILL — Telmisartan Tab 40 MG: ORAL | 30 days supply | Qty: 30 | Fill #2 | Status: AC

## 2021-03-21 ENCOUNTER — Other Ambulatory Visit (HOSPITAL_COMMUNITY): Payer: Self-pay

## 2021-03-22 IMAGING — XA Imaging study
2 series · 2 of 2 positions shown · non-contrast
Comparison: none

CLINICAL DATA: Lumbosacral spondylosis without myelopathy.
Recurrent low back and right hip pain. Good response to multiple
prior epidural injections.

[Series 1: ortho adipose · 1 of 1 slices shown (1 of 2)]
[im 1/1]
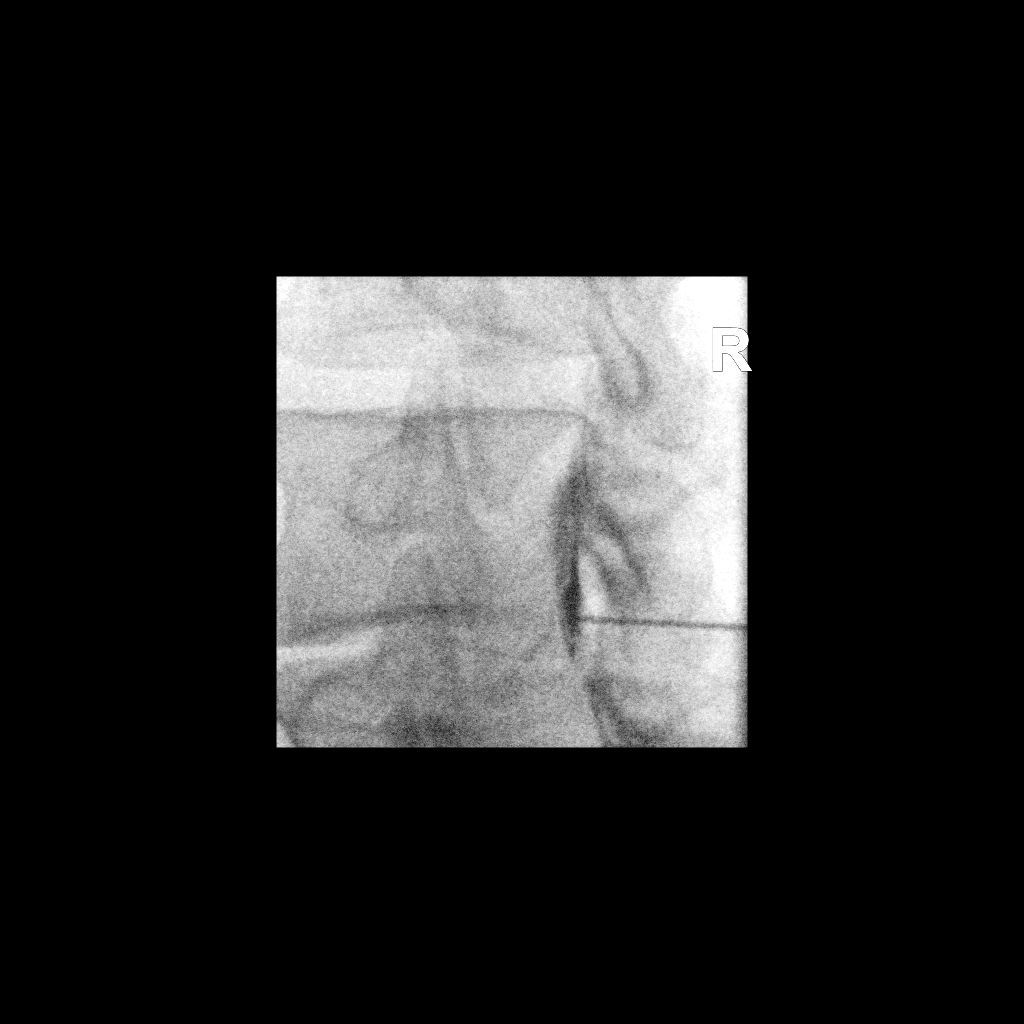

[Series 2: ortho adipose · 1 of 1 slices shown (2 of 2)]
[im 1/1]
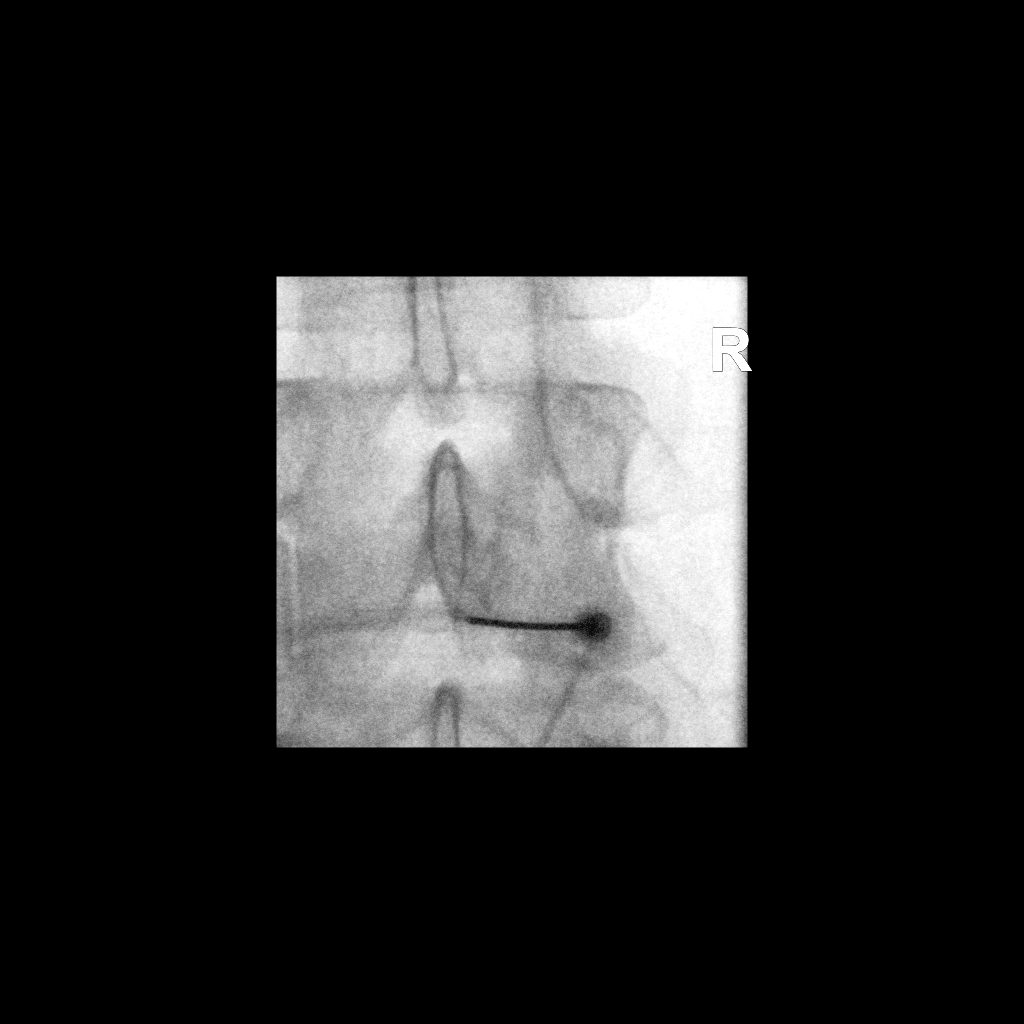

[2 of 2 positions shown; findings below may reference images not displayed]

FLUOROSCOPY TIME:  Fluoroscopy Time: 4 seconds

Radiation Exposure Index: 8.47 microGray*m^2

PROCEDURE:
The procedure, risks, benefits, and alternatives were explained to
the patient. Questions regarding the procedure were encouraged and
answered. The patient understands and consents to the procedure.

LUMBAR EPIDURAL INJECTION:

An interlaminar approach was performed on the right at L4-5. The
overlying skin was cleansed and anesthetized. A 3.5 inch 20 gauge
epidural needle was advanced using loss-of-resistance technique.

DIAGNOSTIC EPIDURAL INJECTION:

Injection of Isovue-M 200 shows a good epidural pattern with spread
above and below the level of needle placement, primarily on the
right. No vascular opacification is seen.

THERAPEUTIC EPIDURAL INJECTION:

120 mg of Depo-Medrol mixed with 3 mL of 1% lidocaine were
instilled. The procedure was well-tolerated, and the patient was
discharged thirty minutes following the injection in good condition.

COMPLICATIONS:
None
IMPRESSION: Technically successful interlaminar epidural injection on the right
at L4-5.

## 2021-03-23 ENCOUNTER — Other Ambulatory Visit (HOSPITAL_COMMUNITY): Payer: Self-pay

## 2021-03-23 MED ORDER — CARESTART COVID-19 HOME TEST VI KIT
PACK | 0 refills | Status: DC
  Filled 2021-03-23: qty 4, 4d supply, fill #0

## 2021-03-23 NOTE — [facility] Notes (Signed)
Virtual Visit via Video Note   This visit type was conducted due to national recommendations for restrictions regarding the COVID-19 Pandemic (e.g. social distancing) in an effort to limit this patient's exposure and mitigate transmission in our community.  Due to her co-morbid illnesses, this patient is at least at moderate risk for complications without adequate follow up.  This format is felt to be most appropriate for this patient at this time.  All issues noted in this document were discussed and addressed.  A limited physical exam was performed with this format.  Please refer to the patient's chart for her consent to telehealth for [facility].       Date:  03/24/2021   ID:  Shelby [name], DOB Mar 02, 1976, MRN [id number] The patient was identified using 2 identifiers.  Patient Location: Home Provider Location: Office/Clinic   PCP:  Crist Infante, MD   [facility] Providers Cardiologist:  None Electrophysiologist:  Vickie Epley, MD     Evaluation Performed:  Follow-Up Visit  Chief Complaint: Palpitations  History of Present Illness:    Shelby [name] is a 45 y.o. female with palpitations who presents for follow-up.  I last saw the patient January 24, 2021.  After that appointment we ordered an echocardiogram and ZIO monitor. She tells me that since I last saw her her palpitations have improved significantly even when she is doing yoga.  The palpitations lasted almost exactly 6 months from her COVID-19 infection.  Past Medical History:  Diagnosis Date   Anxiety    GERD (gastroesophageal reflux disease)    Past Surgical History:  Procedure Laterality Date   Cyst remove  12/2009   INTRAUTERINE DEVICE INSERTION  12/2009     Current Meds  Medication Sig   cefdinir (OMNICEF) 300 MG capsule Take 1 capsule (300 mg total) by mouth 2 (two) times daily with food for 7 days.   celecoxib (CELEBREX) 100 MG capsule TAKE 1 CAPSULE (100 MG TOTAL) BY MOUTH 2 (TWO) TIMES DAILY.    COVID-19 At Home Antigen Test [facility] COVID-19 HOME TEST) KIT Use as directed.   gabapentin (NEURONTIN) 100 MG capsule Take 2 capsules (200 mg total) by mouth at bedtime. (Patient taking differently: Take 200 mg by mouth 3 times/day as needed-between meals & bedtime.)   telmisartan (MICARDIS) 40 MG tablet TAKE 1 TABLET BY MOUTH AT BEDTIME AS DIRECTED   tiZANidine (ZANAFLEX) 4 MG tablet Take 1 tablet (4 mg total) by mouth nightly   traMADol (ULTRAM) 50 MG tablet Take 1 tablet (50 mg total) by mouth every 12 (twelve) hours as needed.   venlafaxine XR (EFFEXOR-XR) 37.5 MG 24 hr capsule Take 1 capsule (37.5 mg total) by mouth daily with breakfast.   [DISCONTINUED] levonorgestrel (MIRENA) 20 MCG/24HR IUD 1 Intra Uterine Device (1 each total) by Intrauterine route once.   [DISCONTINUED] potassium chloride SA (K-DUR,KLOR-CON) 20 MEQ tablet Take 0.5 tablets (10 mEq total) by mouth daily.     Allergies:   Patient has no allergy information on record.   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  ROS:   Please see the history of present illness.     All other systems reviewed and are negative.   Prior CV studies:   The following studies were reviewed today:  Mar 02, 2021 echo personally reviewed Left ventricular function normal, 60% Right ventricular function normal No significant valvular abnormalities  Feb 17, 2021 ZIO personally reviewed HR 39 - 184 bpm, average 80 bpm. 4 episodes of SVT, longest lasting 16 beats at a rate of 160 bpm, likely AT based on  rhythm strip Rare supraventricular and ventricular ectopy. No sustained arrhythmias. Patient triggered episodes correspond to sinus rhythm at various rates.       Labs/Other Tests and Data Reviewed:    EKG: Prior EKGs and Holter monitor data was reviewed  Recent Labs: No results found for requested labs within last 8760 hours.   Recent Lipid Panel Lab Results  Component Value Date/Time   CHOL 135 07/28/2012 09:48 AM   TRIG 46.0 07/28/2012 09:48 AM   HDL 56.60 07/28/2012 09:48 AM   CHOLHDL 2 07/28/2012 09:48 AM   LDLCALC 69 07/28/2012 09:48 AM    Wt Readings from Last 3 Encounters:  03/24/21 171 lb (77.6 kg)  03/16/21 181 lb (82.1 kg)  02/02/21 181 lb (82.1 kg)        Objective:    Vital Signs:  BP 138/90   Ht 5' 9"  (1.753 m)   Wt 171 lb (77.6 kg)   BMI 25.25 kg/m    VITAL SIGNS:  reviewed GEN:  no acute distress PSYCH:  normal affect  ASSESSMENT & PLAN:    Palpitations Significantly improved since I last saw her. 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  Most of these seem to be clustered around times of physical exertion.  Overall very low burden.  No sustained arrhythmias. We discussed using medications to help suppress atrial ectopy but I think given the overall low burden I think that the risks of experiencing side effects of the medication outweigh any potential benefit.  I have advised continued exercise that she is already doing and follow-up as needed.    Time:   Today, I have spent 10 minutes with the patient with telehealth technology discussing the above problems.     Medication Adjustments/Labs and Tests Ordered: Current medicines are reviewed at length with the patient today.  Concerns regarding medicines are outlined above.   Tests Ordered: No orders of the defined types were placed in this encounter.   Medication Changes: No orders of the defined types were placed in this  encounter.   Follow Up: As needed  Signed, Vickie Epley, MD  03/24/2021 9:00 AM    [facility]

## 2021-03-24 ENCOUNTER — Other Ambulatory Visit: Payer: Self-pay

## 2021-03-24 ENCOUNTER — Telehealth (INDEPENDENT_AMBULATORY_CARE_PROVIDER_SITE_OTHER): Payer: 59 | Admitting: Cardiology

## 2021-03-24 VITALS — BP 138/90 | Ht 69.0 in | Wt 171.0 lb

## 2021-03-24 DIAGNOSIS — R002 Palpitations: Secondary | ICD-10-CM | POA: Diagnosis not present

## 2021-03-24 NOTE — Patient Instructions (Signed)
Medication Instructions:  No changes *If you need a refill on your cardiac medications before your next appointment, please call your pharmacy*   Lab Work: none If you have labs (blood work) drawn today and your tests are completely normal, you will receive your results only by: MyChart Message (if you have MyChart) OR A paper copy in the mail If you have any lab test that is abnormal or we need to change your treatment, we will call you to review the results.   Testing/Procedures: none   Follow-Up: As needed 

## 2021-04-07 ENCOUNTER — Other Ambulatory Visit (HOSPITAL_COMMUNITY): Payer: Self-pay

## 2021-04-07 MED FILL — Celecoxib Cap 100 MG: ORAL | 30 days supply | Qty: 60 | Fill #2 | Status: AC

## 2021-04-11 NOTE — [facility] Notes (Signed)
  [facility] [address] [place] Phone: [phone] Subjective:   I [name] am serving as a Education administrator for Dr. Hulan Saas.  This visit occurred during the SARS-CoV-2 public health emergency.  Safety protocols were in place, including screening questions prior to the visit, additional usage of staff PPE, and extensive cleaning of exam room while observing appropriate contact time as indicated for disinfecting solutions.   I'm seeing this patient by the request  of:  Crist Infante, MD  CC: Low back pain follow-up  [id number]  Shelby [name] is a 45 y.o. female coming in with complaint of back and neck pain. OMT 03/16/2021. Patient states she is doing ok today.  Patient did travel and was on a boat.  Has noted some mild tightness.  Starting to get back into the routine doing the exercises, yoga and tennis.  Has not had it where it seems not as bad as what she has had previously.  Medications patient has been prescribed: Effexor, Zanaflex, Celebrex  Taking: Yes       Past Medical History:  Diagnosis Date   Anxiety    GERD (gastroesophageal reflux disease)     Not on File   Review of Systems:  No headache, visual changes, nausea, vomiting, diarrhea, constipation, dizziness, abdominal pain, skin rash, fevers, chills, night sweats, weight loss, swollen lymph nodes, body aches, joint swelling, chest pain, shortness of breath, mood changes. POSITIVE muscle aches  Objective  Blood pressure 118/90, pulse 60, height 5\' 9"  (1.753 m), weight 180 lb (81.6 kg), SpO2 100 %.   General: No apparent distress alert and oriented x3 mood and affect normal, dressed appropriately.  HEENT: Pupils equal, extraocular movements intact  Respiratory: Patient's speak in full sentences and does not appear short of breath  Cardiovascular: No lower extremity edema, non tender, no erythema  Low back exam does have some tightness noted in the paraspinal  musculature right greater than left.  Tightness around the right sacroiliac joint.  Patient does have a pelvic shear noted.  Normal discomfort in the upper back.  Osteopathic findings  T3 extended rotated and side bent right inhaled rib T9 extended rotated and side bent left L2 flexed rotated and side bent right Sacrum right on right     Assessment and Plan: SI (sacroiliac) joint dysfunction Seems to be more of a sacroiliac dysfunction and patient did have more discomfort over the pubic symphysis.  No significant swelling noted though.  Discussed icing regimen and home exercises, which activities to do which wants to avoid.  Increase activity slowly.  Follow-up with me again 6 weeks    Nonallopathic problems  Decision today to treat with OMT was based on Physical Exam  After verbal consent patient was treated with HVLA, ME, FPR techniques in cervical,, thoracic, lumbar, and sacral  areas  Patient tolerated the procedure well with improvement in symptoms  Patient given exercises, stretches and lifestyle modifications  See medications in patient instructions if given  Patient will follow up in 4-8 weeks    The above documentation has been reviewed and is accurate and complete Lyndal Pulley, DO        Note: This dictation was prepared with Dragon dictation along with smaller phrase technology. Any transcriptional errors that result from this process are unintentional.

## 2021-04-12 ENCOUNTER — Encounter: Payer: Self-pay | Admitting: Family Medicine

## 2021-04-12 ENCOUNTER — Other Ambulatory Visit (HOSPITAL_COMMUNITY): Payer: Self-pay

## 2021-04-12 ENCOUNTER — Other Ambulatory Visit: Payer: Self-pay

## 2021-04-12 ENCOUNTER — Ambulatory Visit: Payer: 59 | Admitting: Family Medicine

## 2021-04-12 VITALS — BP 118/90 | HR 60 | Ht 69.0 in | Wt 180.0 lb

## 2021-04-12 DIAGNOSIS — M9902 Segmental and somatic dysfunction of thoracic region: Secondary | ICD-10-CM

## 2021-04-12 DIAGNOSIS — M533 Sacrococcygeal disorders, not elsewhere classified: Secondary | ICD-10-CM

## 2021-04-12 DIAGNOSIS — M9904 Segmental and somatic dysfunction of sacral region: Secondary | ICD-10-CM

## 2021-04-12 DIAGNOSIS — M9903 Segmental and somatic dysfunction of lumbar region: Secondary | ICD-10-CM | POA: Diagnosis not present

## 2021-04-12 DIAGNOSIS — M9901 Segmental and somatic dysfunction of cervical region: Secondary | ICD-10-CM

## 2021-04-12 MED ORDER — CELECOXIB 100 MG PO CAPS
100.0000 mg | ORAL_CAPSULE | Freq: Two times a day (BID) | ORAL | 3 refills | Status: DC
Start: 2021-04-12 — End: 2022-11-10
  Filled 2021-04-12: qty 180, fill #0
  Filled 2021-05-14: qty 180, 90d supply, fill #0
  Filled 2021-08-13: qty 180, 90d supply, fill #1
  Filled 2022-01-08: qty 180, 90d supply, fill #2
  Filled 2022-03-18: qty 180, 90d supply, fill #3

## 2021-04-12 NOTE — Patient Instructions (Addendum)
Good to see you Good luck with the detox Add the red band to your tooth brushing experience See me again in 5-6 weeks

## 2021-04-12 NOTE — Assessment & Plan Note (Signed)
Seems to be more of a sacroiliac dysfunction and patient did have more discomfort over the pubic symphysis.  No significant swelling noted though.  Discussed icing regimen and home exercises, which activities to do which wants to avoid.  Increase activity slowly.  Follow-up with me again 6 weeks

## 2021-04-24 ENCOUNTER — Encounter: Payer: Self-pay | Admitting: Cardiology

## 2021-05-05 ENCOUNTER — Other Ambulatory Visit (HOSPITAL_COMMUNITY): Payer: Self-pay

## 2021-05-05 MED FILL — Telmisartan Tab 40 MG: ORAL | 30 days supply | Qty: 30 | Fill #3 | Status: AC

## 2021-05-15 ENCOUNTER — Other Ambulatory Visit (HOSPITAL_COMMUNITY): Payer: Self-pay

## 2021-05-16 NOTE — [facility] Notes (Signed)
Shelby Shelby [name], am serving as a scribe for Dr. Hulan Saas.  This visit occurred during the SARS-CoV-2 public health emergency.  Safety protocols were in place, including screening questions prior to the visit, additional usage of staff PPE, and extensive cleaning of exam room while observing appropriate contact time as indicated for disinfecting solutions.    Shelby Shelby [name] [place] Phone: [phone] Subjective:    I'm seeing this patient by the request  of:  Shelby Infante, MD  CC: Low back pain follow-up  [id number]  Shelby Shelby [name] is a 45 y.o. female coming in with complaint of back and neck pain. OMT 04/12/2021. Patient states   Medications patient has been prescribed: Celebrex, Zanaflex, Effexor        Last epidural injection for the right L4-L5 ordered by [place] was in January 2022  Reviewed prior external information including notes and imaging from previsou exam, outside providers and external EMR if available.   As well as notes that were available from care everywhere and other healthcare systems.  Past medical history, social, surgical and family history all reviewed in electronic medical record.  Shelby [name] information unless stated regarding to the chief complaint.   Past Medical History:  Diagnosis Date   Anxiety    GERD (gastroesophageal reflux disease)     Not on File   Review of Systems:  Shelby [name] headache, visual changes, nausea, vomiting, diarrhea, constipation, dizziness, abdominal pain, skin rash, fevers, chills, night sweats, weight loss, swollen lymph nodes, body aches, joint swelling, chest pain, shortness of breath, mood changes. POSITIVE muscle aches  Objective  Blood pressure 102/62, pulse 76, height '5\' 9"'$  (1.753 m), weight 179 lb (81.2 kg), SpO2 98 %.   General: Shelby [name] apparent distress alert and oriented x3 mood and affect normal, dressed appropriately.  HEENT: Pupils equal, extraocular  movements intact  Respiratory: Patient's speak in full sentences and does not appear short of breath  Cardiovascular: Shelby [name] lower extremity edema, non tender, Shelby [name] erythema  Low back exam shows the patient does have some tenderness to palpation of the paraspinal musculature tenderness still seems to be more in the paraspinal musculature right greater than left.  Still tightness around the right sacroiliac joint.  Mild worsening pain with any type of severe flexion or extension.  Tightness of the right hip musculature noted as well today.  Osteopathic findings   T3 extended rotated and side bent right inhaled rib T9 extended rotated and side bent left L2 flexed rotated and side bent right Sacrum right on right       Assessment and Plan:  SI (sacroiliac) joint dysfunction Seem to have more mild tightness noted around the paraspinal musculature of the sacroiliac joint and the lumbar spine.  Negative straight leg test. Shelby [name] med change  rtc in 6 weeks    Nonallopathic problems  Decision today to treat with OMT was based on Physical Exam  After verbal consent patient was treated with HVLA, ME, FPR techniques in  rib, thoracic, lumbar, and sacral  areas  Patient tolerated the procedure well with improvement in symptoms  Patient given exercises, stretches and lifestyle modifications  See medications in patient instructions if given  Patient will follow up in 4-8 weeks      The above documentation has been reviewed and is accurate and complete Shelby Pulley, DO       Note: This dictation was prepared with Dragon dictation along with smaller phrase technology.  Any transcriptional errors that result from this process are unintentional.

## 2021-05-17 ENCOUNTER — Ambulatory Visit: Payer: 59 | Admitting: Family Medicine

## 2021-05-17 ENCOUNTER — Other Ambulatory Visit: Payer: Self-pay

## 2021-05-17 ENCOUNTER — Encounter: Payer: Self-pay | Admitting: Family Medicine

## 2021-05-17 VITALS — BP 102/62 | HR 76 | Ht 69.0 in | Wt 179.0 lb

## 2021-05-17 DIAGNOSIS — M9904 Segmental and somatic dysfunction of sacral region: Secondary | ICD-10-CM | POA: Diagnosis not present

## 2021-05-17 DIAGNOSIS — M9902 Segmental and somatic dysfunction of thoracic region: Secondary | ICD-10-CM | POA: Diagnosis not present

## 2021-05-17 DIAGNOSIS — M533 Sacrococcygeal disorders, not elsewhere classified: Secondary | ICD-10-CM | POA: Diagnosis not present

## 2021-05-17 DIAGNOSIS — M9903 Segmental and somatic dysfunction of lumbar region: Secondary | ICD-10-CM

## 2021-05-17 NOTE — Patient Instructions (Signed)
Always great to see you  Good luck with the yard work  See me again in 6-8 weeks

## 2021-05-17 NOTE — Assessment & Plan Note (Signed)
Seem to have more mild tightness noted around the paraspinal musculature of the sacroiliac joint and the lumbar spine.  Negative straight leg test. No med change  rtc in 6 weeks

## 2021-05-30 ENCOUNTER — Ambulatory Visit: Payer: 59 | Admitting: Family Medicine

## 2021-05-30 ENCOUNTER — Encounter: Payer: Self-pay | Admitting: Family Medicine

## 2021-05-30 ENCOUNTER — Other Ambulatory Visit: Payer: Self-pay

## 2021-05-30 VITALS — BP 132/82 | HR 73 | Ht 69.0 in | Wt 182.0 lb

## 2021-05-30 DIAGNOSIS — M9904 Segmental and somatic dysfunction of sacral region: Secondary | ICD-10-CM

## 2021-05-30 DIAGNOSIS — M9908 Segmental and somatic dysfunction of rib cage: Secondary | ICD-10-CM

## 2021-05-30 DIAGNOSIS — M62830 Muscle spasm of back: Secondary | ICD-10-CM

## 2021-05-30 DIAGNOSIS — M9903 Segmental and somatic dysfunction of lumbar region: Secondary | ICD-10-CM | POA: Diagnosis not present

## 2021-05-30 DIAGNOSIS — M9902 Segmental and somatic dysfunction of thoracic region: Secondary | ICD-10-CM

## 2021-05-30 DIAGNOSIS — M9901 Segmental and somatic dysfunction of cervical region: Secondary | ICD-10-CM

## 2021-05-30 NOTE — [facility] Notes (Signed)
  [name] [place] Phone: [phone] Subjective:   Shelby [name], am serving as a scribe for Dr. Hulan Saas.  I'm seeing this patient by the request  of:  Crist Infante, MD  CC: Low back pain follow-up  [id number]  Shelby [name] is a 45 y.o. female coming in with complaint of back and neck pain. OMT 05/17/2021. Patient states that she is having pelvic pain that is "activated" right now and has not been able to settle the pain down.  Patient feels like the manipulation would be beneficial.  Medications patient has been prescribed: Celebrex, Zanaflex, Effexor  Taking: Clebrex, zanaflex, effexor           Past Medical History:  Diagnosis Date   Anxiety    GERD (gastroesophageal reflux disease)     Not on File   Review of Systems:  No headache, visual changes, nausea, vomiting, diarrhea, constipation, dizziness, abdominal pain, skin rash, fevers, chills, night sweats, weight loss, swollen lymph nodes, body aches, joint swelling, chest pain, shortness of breath, mood changes. POSITIVE muscle aches  Objective  Blood pressure 132/82, pulse 73, height '5\' 9"'$  (1.753 m), weight 182 lb (82.6 kg), SpO2 98 %.   General: No apparent distress alert and oriented x3 mood and affect normal, dressed appropriately.  HEENT: Pupils equal, extraocular movements intact  Respiratory: Patient's speak in full sentences and does not appear short of breath  Cardiovascular: No lower extremity edema, non tender, no erythema  Patient is on back does have significant tightness.  Patient does have a tightness of the hip flexors bilaterally.  Some mild limited range of motion of the right hip in internal and external range of motion as well.  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  Patient has had facet arthropathy as well.  We will continue to monitor.  Does respond pretty well though to osteopathic manipulation.  Patient does have the Zanaflex.  Gabapentin and the Celebrex.  Patient has had tramadol for breakthrough pain and has been prescribed Effexor.  Patient will follow-up with me as scheduled with this being an acute exacerbation   Nonallopathic problems  Decision today to treat with OMT was based on Physical Exam  After verbal consent patient was treated with HVLA, ME, FPR techniques in cervical, rib, thoracic, lumbar, and sacral, pelvis areas  Patient tolerated the procedure well with improvement in symptoms  Patient given exercises, stretches and lifestyle modifications  See medications in patient instructions if given  Patient will follow up as scheduled      The above documentation has been reviewed and is accurate and complete Lyndal Pulley, DO        Note: This dictation was prepared with Dragon dictation along with smaller phrase technology. Any transcriptional errors that result from this process are unintentional.

## 2021-05-30 NOTE — Patient Instructions (Signed)
Good to see you See me again in  

## 2021-05-31 NOTE — Assessment & Plan Note (Signed)
Chronic problem with mild exacerbation.  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  Patient has had facet arthropathy as well.  We will continue to monitor.  Does respond pretty well though to osteopathic manipulation.  Patient does have the Zanaflex.  Gabapentin and the Celebrex.  Patient has had tramadol for breakthrough pain and has been prescribed Effexor.  Patient will follow-up with me as scheduled with this being an acute exacerbation

## 2021-06-01 ENCOUNTER — Encounter: Payer: Self-pay | Admitting: Family Medicine

## 2021-06-01 ENCOUNTER — Other Ambulatory Visit: Payer: Self-pay

## 2021-06-01 DIAGNOSIS — M5416 Radiculopathy, lumbar region: Secondary | ICD-10-CM

## 2021-06-04 MED FILL — Telmisartan Tab 40 MG: ORAL | 30 days supply | Qty: 30 | Fill #4 | Status: AC

## 2021-06-05 ENCOUNTER — Other Ambulatory Visit (HOSPITAL_COMMUNITY): Payer: Self-pay

## 2021-06-05 ENCOUNTER — Other Ambulatory Visit: Payer: Self-pay | Admitting: Family Medicine

## 2021-06-06 ENCOUNTER — Other Ambulatory Visit (HOSPITAL_COMMUNITY): Payer: Self-pay

## 2021-06-06 ENCOUNTER — Other Ambulatory Visit: Payer: Self-pay

## 2021-06-06 ENCOUNTER — Ambulatory Visit
Admission: RE | Admit: 2021-06-06 | Discharge: 2021-06-06 | Disposition: A | Discharge status: Home / Self Care | Payer: 59 | Source: Ambulatory Visit | Attending: Family Medicine | Admitting: Family Medicine

## 2021-06-06 DIAGNOSIS — M47817 Spondylosis without myelopathy or radiculopathy, lumbosacral region: Secondary | ICD-10-CM | POA: Diagnosis not present

## 2021-06-06 DIAGNOSIS — M5416 Radiculopathy, lumbar region: Secondary | ICD-10-CM

## 2021-06-06 MED ORDER — METHYLPREDNISOLONE ACETATE 40 MG/ML INJ SUSP (RADIOLOG
80.0000 mg | Freq: Once | INTRAMUSCULAR | Status: AC
  Administered 2021-06-06: 80 mg via EPIDURAL

## 2021-06-06 MED ORDER — VENLAFAXINE HCL ER 37.5 MG PO CP24
37.5000 mg | ORAL_CAPSULE | Freq: Every day | ORAL | 0 refills | Status: DC
Start: 2021-06-06 — End: 2022-01-08
  Filled 2021-06-06: qty 90, 90d supply, fill #0

## 2021-06-06 MED ORDER — IOPAMIDOL (ISOVUE-M 200) INJECTION 41%
1.0000 mL | Freq: Once | INTRAMUSCULAR | Status: AC
  Administered 2021-06-06: 1 mL via EPIDURAL

## 2021-06-06 NOTE — Discharge Instructions (Signed)

## 2021-06-27 ENCOUNTER — Ambulatory Visit: Payer: 59 | Admitting: Family Medicine

## 2021-06-27 ENCOUNTER — Other Ambulatory Visit: Payer: Self-pay

## 2021-06-27 VITALS — BP 122/76 | HR 63 | Ht 69.0 in | Wt 184.0 lb

## 2021-06-27 DIAGNOSIS — M9903 Segmental and somatic dysfunction of lumbar region: Secondary | ICD-10-CM | POA: Diagnosis not present

## 2021-06-27 DIAGNOSIS — M9908 Segmental and somatic dysfunction of rib cage: Secondary | ICD-10-CM | POA: Diagnosis not present

## 2021-06-27 DIAGNOSIS — M9902 Segmental and somatic dysfunction of thoracic region: Secondary | ICD-10-CM | POA: Diagnosis not present

## 2021-06-27 DIAGNOSIS — M9904 Segmental and somatic dysfunction of sacral region: Secondary | ICD-10-CM

## 2021-06-27 DIAGNOSIS — M9901 Segmental and somatic dysfunction of cervical region: Secondary | ICD-10-CM | POA: Diagnosis not present

## 2021-06-27 DIAGNOSIS — M533 Sacrococcygeal disorders, not elsewhere classified: Secondary | ICD-10-CM | POA: Diagnosis not present

## 2021-06-27 NOTE — Patient Instructions (Addendum)
Good to see you  So glad you are feeling better [facility]  See me again in 6 weeks

## 2021-06-27 NOTE — Assessment & Plan Note (Signed)
More likely secondary to more of the sacroiliac joint dysfunction.  He does have the lumbar radiculopathy that does occur from time to time.  Discussed posture and ergonomics.  Discussed potential other treatment options.  Doing relatively well overall with the medications including the Effexor and the Zanaflex.  Follow-up with me again in 6 to 8 weeks

## 2021-06-27 NOTE — [facility] Notes (Signed)
  [name] [place] Phone: [phone] Subjective:   Shelby [name], am serving as a scribe for Dr. Hulan Saas.  I'm seeing this patient by the request  of:  Crist Infante, MD  CC: low back pain follow up   [id number]  Shelby [name] is a 45 y.o. female coming in with complaint of back and neck pain. OMT 05/30/2021. Patient states that the epidural has helped a lot and she has her usual tightness today.  Patient states that overall she is feeling much better.  Will be traveling to do a yoga retreat in the near future. Patient was feeling good but feels like she could use a potential adjustment.  Medications patient has been prescribed: Zanaflex, Effexor, Celebrex  Taking:Zanaflex, Effexor, Celebrex   Epidural done 06/06/21       Reviewed prior external information including notes and imaging from previsou exam, outside providers and external EMR if available.   As well as notes that were available from care everywhere and other healthcare systems.  Past medical history, social, surgical and family history all reviewed in electronic medical record.  No pertanent information unless stated regarding to the chief complaint.   Past Medical History:  Diagnosis Date   Anxiety    GERD (gastroesophageal reflux disease)     No Known Allergies   Review of Systems:  No headache, visual changes, nausea, vomiting, diarrhea, constipation, dizziness, abdominal pain, skin rash, fevers, chills, night sweats, weight loss, swollen lymph nodes, body aches, joint swelling, chest pain, shortness of breath, mood changes. POSITIVE muscle aches  Objective  Blood pressure 122/76, pulse 63, height 5\' 9"  (1.753 m), weight 184 lb (83.5 kg), SpO2 98 %.   General: No apparent distress alert and oriented x3 mood and affect normal, dressed appropriately.  HEENT: Pupils equal, extraocular movements intact  Respiratory: Patient's speak  in full sentences and does not appear short of breath  Cardiovascular: No lower extremity edema, non tender, no erythema  Low back exam shows mild loss of lordosis.  Tenderness to palpation of the paraspinal musculature.  Patient does have tightness with FABER test right greater than left.  Negative straight leg test noted today.  Osteopathic findings  C2 flexed rotated and side bent right C6 flexed rotated and side bent left T3 extended rotated and side bent right inhaled rib T9 extended rotated and side bent left L2 flexed rotated and side bent right Sacrum right on right       Assessment and Plan:    Nonallopathic problems  Decision today to treat with OMT was based on Physical Exam  After verbal consent patient was treated with HVLA, ME, FPR techniques in cervical, rib, thoracic, lumbar, and sacral  areas  Patient tolerated the procedure well with improvement in symptoms  Patient given exercises, stretches and lifestyle modifications  See medications in patient instructions if given  Patient will follow up in 4-8 weeks      The above documentation has been reviewed and is accurate and complete Lyndal Pulley, DO        Note: This dictation was prepared with Dragon dictation along with smaller phrase technology. Any transcriptional errors that result from this process are unintentional.

## 2021-07-06 ENCOUNTER — Other Ambulatory Visit (HOSPITAL_COMMUNITY): Payer: Self-pay

## 2021-07-06 MED FILL — Telmisartan Tab 40 MG: ORAL | 30 days supply | Qty: 30 | Fill #5 | Status: AC

## 2021-07-26 ENCOUNTER — Encounter: Payer: Self-pay | Admitting: Family Medicine

## 2021-07-26 NOTE — Progress Notes (Signed)
Benito Mccreedy D.[place] Phone: [phone]   Assessment and Plan:     1. Acute pain of left knee 2. Closed nondisplaced longitudinal fracture of left patella, initial encounter -Acute with uncertain prognosis, initial visit - Acute nondisplaced longitudinal fracture of left lateral patella as seen on x-ray at today's clinic visit - As there is no displacement, and fracture is longitudinal and lateral side of patella, we will proceed with nonsurgical treatment - Follow-up in 1 week for repeat x-ray to ensure no displacement - Nonweightbearing in knee immobilizer.  Knee immobilizer and crutches provided - May continue Celebrex and tizanidine as needed for pain control - Educated on quadriceps activation exercises that should be performed while in knee immobilizer   Pertinent previous records reviewed include telephone encounter   Follow Up: In 1 week for repeat x-rays and reevaluation   Subjective:   I, Shelby [name], am serving as a Education administrator for Dr. Glennon Mac.  Chief Complaint: knee injury  HPI:   07/27/21 Stepped off with right foot and twisted ankle and fell on left knee. This happened this past Tuesday. Knee extension is hard. Swelling, but did fly. Baring weight hurts.  Denies shortness of breath, chest pain, leg erythema, leg warmth  Relevant Historical Information: None pertinent  Additional pertinent review of systems negative.   Current Outpatient Medications:    cefdinir (OMNICEF) 300 MG capsule, Take 1 capsule (300 mg total) by mouth 2 (two) times daily with food for 7 days., Disp: 14 capsule, Rfl: 0   celecoxib (CELEBREX) 100 MG capsule, Take 1 capsule (100 mg total) by mouth 2 (two) times daily., Disp: 180 capsule, Rfl: 3   COVID-19 At Home Antigen Test (CARESTART COVID-19 HOME TEST) KIT, Use as directed., Disp: 4 each, Rfl: 0   gabapentin (NEURONTIN) 100 MG capsule, Take 2 capsules  (200 mg total) by mouth at bedtime. (Patient taking differently: Take 200 mg by mouth 3 times/day as needed-between meals & bedtime.), Disp: 180 capsule, Rfl: 1   telmisartan (MICARDIS) 40 MG tablet, TAKE 1 TABLET BY MOUTH AT BEDTIME AS DIRECTED, Disp: 30 tablet, Rfl: 11   tiZANidine (ZANAFLEX) 4 MG tablet, Take 1 tablet (4 mg total) by mouth nightly, Disp: 90 tablet, Rfl: 1   traMADol (ULTRAM) 50 MG tablet, Take 1 tablet (50 mg total) by mouth every 12 (twelve) hours as needed., Disp: 60 tablet, Rfl: 0   venlafaxine XR (EFFEXOR-XR) 37.5 MG 24 hr capsule, Take 1 capsule (37.5 mg total) by mouth daily with breakfast., Disp: 90 capsule, Rfl: 0   Objective:     Vitals:   07/27/21 1306  BP: 136/90  Weight: 187 lb (84.8 kg)  Height: _0  (1.753 m)      Body mass index is 27.62 kg/m.    Physical Exam:    General:  awake, alert oriented, no acute distress nontoxic Skin: no suspicious lesions or rashes Neuro:sensation intact, no deficits, strength 5/5 with no deficits, no atrophy, normal muscle tone Psych: No signs of anxiety, depression or other mood disorder  Knee: Large swelling and generalized edema No deformity Positive fluid wave, joint milking ROM Flex 50, Ext 20 Patient able to extend knee, although it is painful TTP quad tendon, patella NTTP over the medial fem condyle, lat fem condyle, tibial tuberostiy, fibular head, posterior fossa, pes anserine bursa, gerdy's tubercle, medial jt line, lateral jt line Neg anterior and posterior drawer Neg lachman Neg sag sign  Negative varus stress Negative valgus stress  Gait hobbled favoring right leg   Electronically signed by:  Benito Mccreedy D.[facility] 3:04 PM 07/27/21

## 2021-07-27 ENCOUNTER — Other Ambulatory Visit: Payer: Self-pay

## 2021-07-27 ENCOUNTER — Ambulatory Visit (INDEPENDENT_AMBULATORY_CARE_PROVIDER_SITE_OTHER): Payer: 59

## 2021-07-27 ENCOUNTER — Ambulatory Visit (INDEPENDENT_AMBULATORY_CARE_PROVIDER_SITE_OTHER): Payer: 59 | Admitting: Sports Medicine

## 2021-07-27 VITALS — BP 136/90 | Ht 69.0 in | Wt 187.0 lb

## 2021-07-27 DIAGNOSIS — S82025A Nondisplaced longitudinal fracture of left patella, initial encounter for closed fracture: Secondary | ICD-10-CM

## 2021-07-27 DIAGNOSIS — M25562 Pain in left knee: Secondary | ICD-10-CM | POA: Diagnosis not present

## 2021-07-27 NOTE — Patient Instructions (Addendum)
Non weight bearing 1 week for xray Do prescribed exercises at least 3x a week in the immobilizer

## 2021-07-28 ENCOUNTER — Encounter: Payer: Self-pay | Admitting: Sports Medicine

## 2021-08-03 ENCOUNTER — Ambulatory Visit: Payer: 59 | Admitting: Sports Medicine

## 2021-08-09 ENCOUNTER — Ambulatory Visit: Payer: 59 | Admitting: Family Medicine

## 2021-08-09 DIAGNOSIS — S82025A Nondisplaced longitudinal fracture of left patella, initial encounter for closed fracture: Secondary | ICD-10-CM | POA: Diagnosis not present

## 2021-08-10 MED FILL — Telmisartan Tab 40 MG: ORAL | 30 days supply | Qty: 30 | Fill #6 | Status: AC

## 2021-08-11 ENCOUNTER — Other Ambulatory Visit (HOSPITAL_COMMUNITY): Payer: Self-pay

## 2021-08-14 ENCOUNTER — Other Ambulatory Visit (HOSPITAL_COMMUNITY): Payer: Self-pay

## 2021-08-25 DIAGNOSIS — S82025A Nondisplaced longitudinal fracture of left patella, initial encounter for closed fracture: Secondary | ICD-10-CM | POA: Diagnosis not present

## 2021-08-25 DIAGNOSIS — M25562 Pain in left knee: Secondary | ICD-10-CM | POA: Diagnosis not present

## 2021-09-11 DIAGNOSIS — S82002D Unspecified fracture of left patella, subsequent encounter for closed fracture with routine healing: Secondary | ICD-10-CM | POA: Diagnosis not present

## 2021-09-11 DIAGNOSIS — M25562 Pain in left knee: Secondary | ICD-10-CM | POA: Diagnosis not present

## 2021-09-11 NOTE — Progress Notes (Deleted)
  [place] Phone: [phone] Subjective:    I'm seeing this patient by the request  of:  Crist Infante, MD  CC:   [id number]  Shelby [name] is a 45 y.o. female coming in with complaint of back and neck pain. OMT on 06/27/2021. Saw Dr. Glennon Mac for patellar fracture in October. Patient states   Medications patient has been prescribed: None  Taking:         Reviewed prior external information including notes and imaging from previsou exam, outside providers and external EMR if available.   As well as notes that were available from care everywhere and other healthcare systems.  Past medical history, social, surgical and family history all reviewed in electronic medical record.  No pertanent information unless stated regarding to the chief complaint.   Past Medical History:  Diagnosis Date   Anxiety    GERD (gastroesophageal reflux disease)     Allergies  Allergen Reactions   Cefdinir Swelling and Itching     Review of Systems:  No headache, visual changes, nausea, vomiting, diarrhea, constipation, dizziness, abdominal pain, skin rash, fevers, chills, night sweats, weight loss, swollen lymph nodes, body aches, joint swelling, chest pain, shortness of breath, mood changes. POSITIVE muscle aches  Objective  There were no vitals taken for this visit.   General: No apparent distress alert and oriented x3 mood and affect normal, dressed appropriately.  HEENT: Pupils equal, extraocular movements intact  Respiratory: Patient's speak in full sentences and does not appear short of breath  Cardiovascular: No lower extremity edema, non tender, no erythema  Neuro: Cranial nerves II through XII are intact, neurovascularly intact in all extremities with 2+ DTRs and 2+ pulses.  Gait normal with good balance and coordination.  MSK:  Non tender with full range of motion and good stability and symmetric strength  and tone of shoulders, elbows, wrist, hip, knee and ankles bilaterally.  Back - Normal skin, Spine with normal alignment and no deformity.  No tenderness to vertebral process palpation.  Paraspinous muscles are not tender and without spasm.   Range of motion is full at neck and lumbar sacral regions  Osteopathic findings  C2 flexed rotated and side bent right C6 flexed rotated and side bent left T3 extended rotated and side bent right inhaled rib T9 extended rotated and side bent left L2 flexed rotated and side bent right Sacrum right on right       Assessment and Plan:    Nonallopathic problems  Decision today to treat with OMT was based on Physical Exam  After verbal consent patient was treated with HVLA, ME, FPR techniques in cervical, rib, thoracic, lumbar, and sacral  areas  Patient tolerated the procedure well with improvement in symptoms  Patient given exercises, stretches and lifestyle modifications  See medications in patient instructions if given  Patient will follow up in 4-8 weeks      The above documentation has been reviewed and is accurate and complete Lyndal Pulley, DO       Note: This dictation was prepared with Dragon dictation along with smaller phrase technology. Any transcriptional errors that result from this process are unintentional.

## 2021-09-12 ENCOUNTER — Ambulatory Visit: Payer: 59 | Admitting: Family Medicine

## 2021-09-12 ENCOUNTER — Other Ambulatory Visit: Payer: Self-pay | Admitting: Family Medicine

## 2021-09-12 ENCOUNTER — Other Ambulatory Visit (HOSPITAL_COMMUNITY): Payer: Self-pay

## 2021-09-12 MED ORDER — TIZANIDINE HCL 4 MG PO TABS
4.0000 mg | ORAL_TABLET | Freq: Every evening | ORAL | 1 refills | Status: DC
  Filled 2021-09-12: qty 90, 90d supply, fill #0
  Filled 2021-12-11: qty 90, 90d supply, fill #1

## 2021-09-13 NOTE — Progress Notes (Signed)
Shelby [name] [place] Phone: [phone] Subjective:   Shelby [name], am serving as a scribe for Dr. Hulan Saas.  This visit occurred during the SARS-CoV-2 public health emergency.  Safety protocols were in place, including screening questions prior to the visit, additional usage of staff PPE, and extensive cleaning of exam room while observing appropriate contact time as indicated for disinfecting solutions.   I'm seeing this patient by the request  of:  Crist Infante, MD  CC: Low back pain follow-up  [id number]  Shelby [name] is a 45 y.o. female coming in with complaint of back and neck pain. OMT on 06/27/2021. Patient states that she is doing well. [name] back pain since fracturing L patella. Had to be more sedentary but is now working back into activity since injury.  Has been sometime since we have seen patient.  [name] new symptoms just more tightness noted.  Medications patient has been prescribed: Zanaflex  Taking: Yes as well as the Celebrex and Effexor         Reviewed prior external information including notes and imaging from previsou exam, outside providers and external EMR if available.   As well as notes that were available from care everywhere and other healthcare systems.  Past medical history, social, surgical and family history all reviewed in electronic medical record.  [name] information unless stated regarding to the chief complaint.   Past Medical History:  Diagnosis Date   Anxiety    GERD (gastroesophageal reflux disease)     Allergies  Allergen Reactions   Cefdinir Swelling and Itching     Review of Systems:  [name] headache, visual changes, nausea, vomiting, diarrhea, constipation, dizziness, abdominal pain, skin rash, fevers, chills, night sweats, weight loss, swollen lymph nodes, body aches, joint swelling, chest pain, shortness of breath, mood changes. POSITIVE muscle  aches  Objective  Blood pressure 130/88, pulse 69, height 5\' 9"  (1.753 m), weight 185 lb (83.9 kg), SpO2 98 %.   General: [name] apparent distress alert and oriented x3 mood and affect normal, dressed appropriately.  HEENT: Pupils equal, extraocular movements intact  Respiratory: Patient's speak in full sentences and does not appear short of breath  Cardiovascular: [name] lower extremity edema, non tender, [name] erythema  Patient is able to move her left knee relatively well with full flexion and full extension. Back exam does have tenderness noted at the paraspinal musculature.  Tightness also noted of the right sacroiliac joint.  Tightness of the piriformis bilaterally with tightness with FABER test.   Osteopathic findings  C2 flexed rotated and side bent right C6 flexed rotated and side bent left T3 extended rotated and side bent right inhaled rib T9 extended rotated and side bent left L2 flexed rotated and side bent right Sacrum right on right       Assessment and Plan:  SI (sacroiliac) joint dysfunction Tightness noted in the lower back but overall not terrible.  Patient has responded to the medications previously and we will refill as necessary.  This includes the Zanaflex, Effexor, and Celebrex.  Patient was on crutches for the knee fracture but is doing well with physical therapy.  Follow-up with me again in 4 to 8 weeks   Nonallopathic problems  Decision today to treat with OMT was based on Physical Exam  After verbal consent patient was treated with HVLA, ME, FPR techniques in cervical, rib, thoracic, lumbar, and sacral  areas  Patient tolerated the procedure  well with improvement in symptoms  Patient given exercises, stretches and lifestyle modifications  See medications in patient instructions if given  Patient will follow up in 4-8 weeks      The above documentation has been reviewed and is accurate and complete Shelby Pulley, DO        Note: This dictation  was prepared with Dragon dictation along with smaller phrase technology. Any transcriptional errors that result from this process are unintentional.

## 2021-09-14 ENCOUNTER — Ambulatory Visit: Payer: 59 | Admitting: Family Medicine

## 2021-09-14 ENCOUNTER — Encounter: Payer: Self-pay | Admitting: Family Medicine

## 2021-09-14 ENCOUNTER — Other Ambulatory Visit: Payer: Self-pay

## 2021-09-14 VITALS — BP 130/88 | HR 69 | Ht 69.0 in | Wt 185.0 lb

## 2021-09-14 DIAGNOSIS — M533 Sacrococcygeal disorders, not elsewhere classified: Secondary | ICD-10-CM

## 2021-09-14 DIAGNOSIS — M9901 Segmental and somatic dysfunction of cervical region: Secondary | ICD-10-CM

## 2021-09-14 DIAGNOSIS — M9903 Segmental and somatic dysfunction of lumbar region: Secondary | ICD-10-CM

## 2021-09-14 DIAGNOSIS — M9904 Segmental and somatic dysfunction of sacral region: Secondary | ICD-10-CM

## 2021-09-14 DIAGNOSIS — M9908 Segmental and somatic dysfunction of rib cage: Secondary | ICD-10-CM | POA: Diagnosis not present

## 2021-09-14 DIAGNOSIS — M9902 Segmental and somatic dysfunction of thoracic region: Secondary | ICD-10-CM | POA: Diagnosis not present

## 2021-09-14 NOTE — Assessment & Plan Note (Signed)
Tightness noted in the lower back but overall not terrible.  Patient has responded to the medications previously and we will refill as necessary.  This includes the Zanaflex, Effexor, and Celebrex.  Patient was on crutches for the knee fracture but is doing well with physical therapy.  Follow-up with me again in 4 to 8 weeks

## 2021-09-19 DIAGNOSIS — S82002D Unspecified fracture of left patella, subsequent encounter for closed fracture with routine healing: Secondary | ICD-10-CM | POA: Diagnosis not present

## 2021-09-19 DIAGNOSIS — M25562 Pain in left knee: Secondary | ICD-10-CM | POA: Diagnosis not present

## 2021-09-21 DIAGNOSIS — S82002D Unspecified fracture of left patella, subsequent encounter for closed fracture with routine healing: Secondary | ICD-10-CM | POA: Diagnosis not present

## 2021-09-21 DIAGNOSIS — M25562 Pain in left knee: Secondary | ICD-10-CM | POA: Diagnosis not present

## 2021-09-22 DIAGNOSIS — S82025A Nondisplaced longitudinal fracture of left patella, initial encounter for closed fracture: Secondary | ICD-10-CM | POA: Diagnosis not present

## 2021-09-22 DIAGNOSIS — M25562 Pain in left knee: Secondary | ICD-10-CM | POA: Diagnosis not present

## 2021-09-27 ENCOUNTER — Other Ambulatory Visit (HOSPITAL_COMMUNITY): Payer: Self-pay

## 2021-09-27 MED FILL — Telmisartan Tab 40 MG: ORAL | 30 days supply | Qty: 30 | Fill #7 | Status: AC

## 2021-10-18 NOTE — Progress Notes (Signed)
[place] Phone: [phone] Subjective:   Shelby Shelby [name], am serving as a scribe for Dr. Hulan Saas. This visit occurred during the SARS-CoV-2 public health emergency.  Safety protocols were in place, including screening questions prior to the visit, additional usage of staff PPE, and extensive cleaning of exam room while observing appropriate contact time as indicated for disinfecting solutions.  I'm seeing this patient by the request  of:  Shelby Infante, MD  CC: Back pain follow-up  [id number]  Shelby Shelby [name] is a 46 y.o. female coming in with complaint of back and neck pain. OMT on 09/12/2021. Patient states he has mild tightness.  Nothing as severe as patient has had in the past.  It does seem noted to be getting tighter the longer she is going from the manipulation.  Has been seen/therapist.  Continue to increase range of motion of the knee that patient continues to have difficulty.  Medications patient has been prescribed: Zanaflex  Taking:         Reviewed prior external information including notes and imaging from previsou exam, outside providers and external EMR if available.   As well as notes that were available from care everywhere and other healthcare systems.  Past medical history, social, surgical and family history all reviewed in electronic medical record.  Shelby [name] information unless stated regarding to the chief complaint.   Past Medical History:  Diagnosis Date   Anxiety    GERD (gastroesophageal reflux disease)     Allergies  Allergen Reactions   Cefdinir Swelling and Itching     Review of Systems:  Shelby [name] headache, visual changes, nausea, vomiting, diarrhea, constipation, dizziness, abdominal pain, skin rash, fevers, chills, night sweats, weight loss, swollen lymph nodes, body aches, joint swelling, chest pain, shortness of breath, mood changes. POSITIVE muscle  aches  Objective  Blood pressure 116/84, pulse 89, height 5\' 9"  (1.753 m), weight 187 lb (84.8 kg), SpO2 98 %.   General: Shelby [name] apparent distress alert and oriented x3 mood and affect normal, dressed appropriately.  HEENT: Pupils equal, extraocular movements intact  Respiratory: Patient's speak in full sentences and does not appear short of breath  Cardiovascular: Shelby [name] lower extremity edema, non tender, Shelby [name] erythema  Neuro: Cranial nerves II through XII are intact, neurovascularly intact in all extremities with 2+ DTRs and 2+ pulses.  Gait normal with good balance and coordination.  MSK:  Non tender with full range of motion and good stability and symmetric strength and tone of shoulders, elbows, wrist, hip, knee and ankles bilaterally.  Back - Normal skin, Spine with normal alignment and Shelby [name] deformity.  Shelby [name] tenderness to vertebral process palpation.  Paraspinous muscles are not tender and without spasm.   Range of motion is full at neck and lumbar sacral regions  Osteopathic findings  C2 flexed rotated and side bent right C6 flexed rotated and side bent left T3 extended rotated and side bent right inhaled rib T4 extended rotated and side bent left L2 flexed rotated and side bent right L5 flexed rotated and side bent right Sacrum right on right       Assessment and Plan:  B12 deficiency B12 injection given today.  Tolerated procedure well.  We will see how patient responds.  Has not been taking the oral regularly.  SI (sacroiliac) joint dysfunction Continues tightness.  Has started to increase activity again.  I do still feel that the limitations that patient has from  patient's knee injury likely has contributed to some tightness.  Is responding well to manipulation.  Has different medications such as the Zanaflex, tramadol for breakthrough.  Follow-up again in 4 to 6 weeks   Nonallopathic problems  Decision today to treat with OMT was based on Physical Exam  After verbal consent  patient was treated with HVLA, ME, FPR techniques in cervical, rib, thoracic, lumbar, and sacral  areas  Patient tolerated the procedure well with improvement in symptoms  Patient given exercises, stretches and lifestyle modifications  See medications in patient instructions if given  Patient will follow up in 4-8 weeks     The above documentation has been reviewed and is accurate and complete Shelby Pulley, DO        Note: This dictation was prepared with Dragon dictation along with smaller phrase technology. Any transcriptional errors that result from this process are unintentional.

## 2021-10-19 ENCOUNTER — Other Ambulatory Visit: Payer: Self-pay

## 2021-10-19 ENCOUNTER — Encounter: Payer: Self-pay | Admitting: Family Medicine

## 2021-10-19 ENCOUNTER — Ambulatory Visit: Payer: 59 | Admitting: Family Medicine

## 2021-10-19 VITALS — BP 116/84 | HR 89 | Ht 69.0 in | Wt 187.0 lb

## 2021-10-19 DIAGNOSIS — M9904 Segmental and somatic dysfunction of sacral region: Secondary | ICD-10-CM

## 2021-10-19 DIAGNOSIS — M9901 Segmental and somatic dysfunction of cervical region: Secondary | ICD-10-CM

## 2021-10-19 DIAGNOSIS — M9908 Segmental and somatic dysfunction of rib cage: Secondary | ICD-10-CM | POA: Diagnosis not present

## 2021-10-19 DIAGNOSIS — E538 Deficiency of other specified B group vitamins: Secondary | ICD-10-CM

## 2021-10-19 DIAGNOSIS — M533 Sacrococcygeal disorders, not elsewhere classified: Secondary | ICD-10-CM

## 2021-10-19 DIAGNOSIS — M9902 Segmental and somatic dysfunction of thoracic region: Secondary | ICD-10-CM | POA: Diagnosis not present

## 2021-10-19 DIAGNOSIS — M9903 Segmental and somatic dysfunction of lumbar region: Secondary | ICD-10-CM

## 2021-10-19 MED ORDER — CYANOCOBALAMIN 1000 MCG/ML IJ SOLN
1000.0000 ug | Freq: Once | INTRAMUSCULAR | Status: AC
  Administered 2021-10-19: 1000 ug via INTRAMUSCULAR

## 2021-10-19 NOTE — Patient Instructions (Signed)
Great to see you  Give you a B12 today  Keep pushing about it See me again in 4-6 weeks

## 2021-10-19 NOTE — Assessment & Plan Note (Signed)
B12 injection given today.  Tolerated procedure well.  We will see how patient responds.  Has not been taking the oral regularly.

## 2021-10-19 NOTE — Assessment & Plan Note (Signed)
Continues tightness.  Has started to increase activity again.  I do still feel that the limitations that patient has from patient's knee injury likely has contributed to some tightness.  Is responding well to manipulation.  Has different medications such as the Zanaflex, tramadol for breakthrough.  Follow-up again in 4 to 6 weeks

## 2021-10-31 DIAGNOSIS — Z01419 Encounter for gynecological examination (general) (routine) without abnormal findings: Secondary | ICD-10-CM | POA: Diagnosis not present

## 2021-10-31 DIAGNOSIS — Z6827 Body mass index (BMI) 27.0-27.9, adult: Secondary | ICD-10-CM | POA: Diagnosis not present

## 2021-10-31 DIAGNOSIS — Z1231 Encounter for screening mammogram for malignant neoplasm of breast: Secondary | ICD-10-CM | POA: Diagnosis not present

## 2021-10-31 MED FILL — Telmisartan Tab 40 MG: ORAL | 30 days supply | Qty: 30 | Fill #8 | Status: AC

## 2021-11-01 ENCOUNTER — Other Ambulatory Visit (HOSPITAL_COMMUNITY): Payer: Self-pay

## 2021-11-29 ENCOUNTER — Ambulatory Visit (AMBULATORY_SURGERY_CENTER): Payer: 59 | Admitting: *Deleted

## 2021-11-29 ENCOUNTER — Other Ambulatory Visit: Payer: Self-pay

## 2021-11-29 ENCOUNTER — Other Ambulatory Visit (HOSPITAL_COMMUNITY): Payer: Self-pay

## 2021-11-29 VITALS — Ht 69.0 in | Wt 180.0 lb

## 2021-11-29 DIAGNOSIS — D2372 Other benign neoplasm of skin of left lower limb, including hip: Secondary | ICD-10-CM | POA: Diagnosis not present

## 2021-11-29 DIAGNOSIS — D2271 Melanocytic nevi of right lower limb, including hip: Secondary | ICD-10-CM | POA: Diagnosis not present

## 2021-11-29 DIAGNOSIS — D1801 Hemangioma of skin and subcutaneous tissue: Secondary | ICD-10-CM | POA: Diagnosis not present

## 2021-11-29 DIAGNOSIS — Z1211 Encounter for screening for malignant neoplasm of colon: Secondary | ICD-10-CM

## 2021-11-29 DIAGNOSIS — D225 Melanocytic nevi of trunk: Secondary | ICD-10-CM | POA: Diagnosis not present

## 2021-11-29 DIAGNOSIS — L821 Other seborrheic keratosis: Secondary | ICD-10-CM | POA: Diagnosis not present

## 2021-11-29 DIAGNOSIS — D2262 Melanocytic nevi of left upper limb, including shoulder: Secondary | ICD-10-CM | POA: Diagnosis not present

## 2021-11-29 DIAGNOSIS — L814 Other melanin hyperpigmentation: Secondary | ICD-10-CM | POA: Diagnosis not present

## 2021-11-29 DIAGNOSIS — D224 Melanocytic nevi of scalp and neck: Secondary | ICD-10-CM | POA: Diagnosis not present

## 2021-11-29 DIAGNOSIS — D2261 Melanocytic nevi of right upper limb, including shoulder: Secondary | ICD-10-CM | POA: Diagnosis not present

## 2021-11-29 MED ORDER — TRETINOIN 0.025 % EX CREA
TOPICAL_CREAM | Freq: Every evening | CUTANEOUS | 3 refills | Status: AC
  Filled 2021-11-29: qty 45, 30d supply, fill #0

## 2021-11-29 MED ORDER — FLUOROURACIL 5 % EX CREA
1.0000 "application " | TOPICAL_CREAM | Freq: Every day | CUTANEOUS | 0 refills | Status: DC
  Filled 2021-11-29: qty 40, 30d supply, fill #0

## 2021-11-29 MED ORDER — HYDROQUINONE 4 % EX CREA
TOPICAL_CREAM | Freq: Every day | CUTANEOUS | 2 refills | Status: AC
  Filled 2021-11-29: qty 28.35, 30d supply, fill #0
  Filled 2022-11-05: qty 28.35, 30d supply, fill #1

## 2021-11-29 NOTE — Progress Notes (Signed)
[place] Phone: [phone] Subjective:   [name], am serving as a scribe for Dr. Hulan Saas.  This visit occurred during the SARS-CoV-2 public health emergency.  Safety protocols were in place, including screening questions prior to the visit, additional usage of staff PPE, and extensive cleaning of exam room while observing appropriate contact time as indicated for disinfecting solutions.  I'm seeing this patient by the request  of:  Crist Infante, MD  CC: Back pain follow-up  [id number]  Shelby [name] is a 46 y.o. female coming in with complaint of back and neck pain. OMT on 10/19/2021. Patient states doing relatively well at the moment.  Feels that her knee is still somewhat tight at the moment.  Still not increased her activity either way at the moment.  Patient wants to start playing tennis on a more regular basis.  Medications patient has been prescribed: None  Taking:         Reviewed prior external information including notes and imaging from previsou exam, outside providers and external EMR if available.   As well as notes that were available from care everywhere and other healthcare systems.  Past medical history, social, surgical and family history all reviewed in electronic medical record.  No pertanent information unless stated regarding to the chief complaint.   Past Medical History:  Diagnosis Date   Anxiety    Arthritis    back   GERD (gastroesophageal reflux disease)    Hypertension    controlled on meds   Palpitations    ECho and monitor 2022 normal    No Known Allergies   Review of Systems:  No headache, visual changes, nausea, vomiting, diarrhea, constipation, dizziness, abdominal pain, skin rash, fevers, chills, night sweats, weight loss, swollen lymph nodes, body aches, joint swelling, chest pain, shortness of breath, mood changes. POSITIVE muscle  aches  Objective  Blood pressure 118/84, pulse 70, height 5\' 9"  (1.753 m), weight 167 lb (75.8 kg), last menstrual period 11/23/2021, SpO2 99 %.   General: No apparent distress alert and oriented x3 mood and affect normal, dressed appropriately.  HEENT: Pupils equal, extraocular movements intact  Respiratory: Patient's speak in full sentences and does not appear short of breath  Cardiovascular: No lower extremity edema, non tender, no erythema  Low back exam does have some loss of lordosis.  Some tenderness to palpation minorly in the paraspinal musculature, right greater than left. Left knee exam shows the patient does have improvement in range of motion.  Still some tenderness to palpation on the front side of the knee.  Osteopathic findings   T9 extended rotated and side bent left L2 flexed rotated and side bent right Sacrum right on right       Assessment and Plan:  Acute lumbar radiculopathy Chronic but stable, no significant radiation of the pain, discussed icing regimen and home exercises, discussed which activities to do and which ones to avoid, increase activity slowly.  Follow-up again in 6 to 8 weeks    Nonallopathic problems  Decision today to treat with OMT was based on Physical Exam  After verbal consent patient was treated with HVLA, ME, FPR techniques in  thoracic, lumbar, and sacral  areas  Patient tolerated the procedure well with improvement in symptoms  Patient given exercises, stretches and lifestyle modifications  See medications in patient instructions if given  Patient will follow up in 4-8 weeks  The above documentation has been reviewed and is accurate and complete Lyndal Pulley, DO       Note: This dictation was prepared with Dragon dictation along with smaller phrase technology. Any transcriptional errors that result from this process are unintentional.

## 2021-11-29 NOTE — Progress Notes (Signed)
No egg or soy allergy known to patient  No issues known to pt with past sedation with any surgeries or procedures Patient denies ever being told they had issues or difficulty with intubation  No FH of Malignant Hyperthermia Pt is not on diet pills Pt is not on  home 02  Pt is not on blood thinners  Pt denies issues with constipation  No A fib or A flutter  Pt is fully vaccinated  for Covid  Pt has a SUtab kit at home  Due to the COVID-19 pandemic we are asking patients to follow certain guidelines in PV and the [facility]   Pt aware of COVID protocols and [facility] guidelines   PV completed over the phone. Pt verified name, DOB, address and insurance during PV today.  Pt mailed instruction packet with copy of consent form to read and not return, and instructions.  Pt encouraged to call with questions or issues.  If pt has My chart, procedure instructions sent via My Chart

## 2021-11-30 ENCOUNTER — Other Ambulatory Visit: Payer: Self-pay

## 2021-11-30 ENCOUNTER — Ambulatory Visit: Payer: 59 | Admitting: Family Medicine

## 2021-11-30 VITALS — BP 118/84 | HR 70 | Ht 69.0 in | Wt 167.0 lb

## 2021-11-30 DIAGNOSIS — M9904 Segmental and somatic dysfunction of sacral region: Secondary | ICD-10-CM | POA: Diagnosis not present

## 2021-11-30 DIAGNOSIS — M9903 Segmental and somatic dysfunction of lumbar region: Secondary | ICD-10-CM

## 2021-11-30 DIAGNOSIS — M9902 Segmental and somatic dysfunction of thoracic region: Secondary | ICD-10-CM

## 2021-11-30 DIAGNOSIS — M5416 Radiculopathy, lumbar region: Secondary | ICD-10-CM | POA: Diagnosis not present

## 2021-11-30 NOTE — Assessment & Plan Note (Signed)
Chronic but stable, no significant radiation of the pain, discussed icing regimen and home exercises, discussed which activities to do and which ones to avoid, increase activity slowly.  Follow-up again in 6 to 8 weeks

## 2021-11-30 NOTE — Patient Instructions (Signed)
Tell Ulice Dash to take a deep breath Go easy on the other tennis players See you again in 7-8 weeks

## 2021-12-05 MED FILL — Telmisartan Tab 40 MG: ORAL | 30 days supply | Qty: 30 | Fill #9 | Status: AC

## 2021-12-06 ENCOUNTER — Other Ambulatory Visit (HOSPITAL_COMMUNITY): Payer: Self-pay

## 2021-12-07 ENCOUNTER — Encounter: Payer: Self-pay | Admitting: Gastroenterology

## 2021-12-12 ENCOUNTER — Other Ambulatory Visit (HOSPITAL_COMMUNITY): Payer: Self-pay

## 2021-12-13 ENCOUNTER — Ambulatory Visit (AMBULATORY_SURGERY_CENTER): Payer: 59 | Admitting: Gastroenterology

## 2021-12-13 ENCOUNTER — Encounter: Payer: Self-pay | Admitting: Gastroenterology

## 2021-12-13 VITALS — BP 99/60 | HR 53 | Temp 98.0°F | Resp 11 | Ht 69.0 in | Wt 180.0 lb

## 2021-12-13 DIAGNOSIS — Z1211 Encounter for screening for malignant neoplasm of colon: Secondary | ICD-10-CM | POA: Diagnosis not present

## 2021-12-13 MED ORDER — SODIUM CHLORIDE 0.9 % IV SOLN: 500.0000 mL | Freq: Once | INTRAVENOUS | Status: DC

## 2021-12-13 NOTE — Progress Notes (Signed)
To Pacu, VSS. Report to rn.tb ?

## 2021-12-13 NOTE — [facility] Notes (Signed)
[facility] History and Physical ? ? ?Primary Care Physician:  Crist Infante, MD ? ? ?Reason for Procedure:  Colorectal cancer screening ? ?Plan:    Screening colonoscopy with possible interventions as needed ? ? ? ? ?HPI: Shelby [name] is a very pleasant 46 y.o. female here for screening colonoscopy. ?Denies any nausea, vomiting, abdominal pain, melena or bright red blood per rectum ? ?The risks and benefits as well as alternatives of endoscopic procedure(s) have been discussed and reviewed. All questions answered. The patient agrees to proceed. ? ? ? ?Past Medical History:  ?Diagnosis Date  ? Anxiety   ? Arthritis   ? back  ? GERD (gastroesophageal reflux disease)   ? Hypertension   ? controlled on meds  ? Palpitations   ? ECho and monitor 2022 normal  ? ? ?Past Surgical History:  ?Procedure Laterality Date  ? Cyst remove  12/06/2009  ? INTRAUTERINE DEVICE INSERTION  12/06/2009  ? IUD REMOVAL    ? ? ?Prior to Admission medications   ?Medication Sig Start Date End Date Taking? Authorizing Provider  ?celecoxib (CELEBREX) 100 MG capsule Take 1 capsule (100 mg total) by mouth 2 (two) times daily. ?Patient taking differently: Take 100 mg by mouth daily. 04/12/21  Yes Lyndal Pulley, DO  ?ondansetron (ZOFRAN-ODT) 4 MG disintegrating tablet    Yes [provider]  ?telmisartan (MICARDIS) 40 MG tablet TAKE 1 TABLET BY MOUTH AT BEDTIME AS DIRECTED 12/06/20 01/11/22 Yes Crist Infante, MD  ?tretinoin (RETIN-A) 0.025 % cream Apply a pea sized amount topically to face nightly 11/29/21  Yes   ?venlafaxine XR (EFFEXOR-XR) 37.5 MG 24 hr capsule Take 1 capsule (37.5 mg total) by mouth daily with breakfast. 06/06/21  Yes Lyndal Pulley, DO  ?COVID-19 At Home Antigen Test [facility] COVID-19 HOME TEST) KIT Use as directed. 03/22/21   Jefm Bryant, RPH  ?fluorouracil (EFUDEX) 5 % cream Apply 1 application (a small amount) topically to skin daily for a single dose. 11/29/21     ?gabapentin (NEURONTIN) 100 MG capsule  Take 2 capsules (200 mg total) by mouth at bedtime. 07/15/18   Lyndal Pulley, DO  ?hydroquinone 4 % cream Apply a small amount topically to affected area once daily. 11/29/21     ?OVER THE COUNTER MEDICATION as needed.    [provider]  ?tiZANidine (ZANAFLEX) 4 MG tablet Take 1 tablet (4 mg total) by mouth nightly 09/12/21   Lyndal Pulley, DO  ?traMADol (ULTRAM) 50 MG tablet Take 1 tablet (50 mg total) by mouth every 12 (twelve) hours as needed. 06/10/20   Lyndal Pulley, DO  ? ? ?Current Outpatient Medications  ?Medication Sig Dispense Refill  ? celecoxib (CELEBREX) 100 MG capsule Take 1 capsule (100 mg total) by mouth 2 (two) times daily. (Patient taking differently: Take 100 mg by mouth daily.) 180 capsule 3  ? ondansetron (ZOFRAN-ODT) 4 MG disintegrating tablet     ? telmisartan (MICARDIS) 40 MG tablet TAKE 1 TABLET BY MOUTH AT BEDTIME AS DIRECTED 30 tablet 11  ? tretinoin (RETIN-A) 0.025 % cream Apply a pea sized amount topically to face nightly 45 g 3  ? venlafaxine XR (EFFEXOR-XR) 37.5 MG 24 hr capsule Take 1 capsule (37.5 mg total) by mouth daily with breakfast. 90 capsule 0  ? COVID-19 At Home Antigen Test [facility] COVID-19 HOME TEST) KIT Use as directed. 4 each 0  ? fluorouracil (EFUDEX) 5 % cream Apply 1 application (a small amount) topically to skin  daily for a single dose. 40 g 0  ? gabapentin (NEURONTIN) 100 MG capsule Take 2 capsules (200 mg total) by mouth at bedtime. 180 capsule 1  ? hydroquinone 4 % cream Apply a small amount topically to affected area once daily. 28.35 g 2  ? OVER THE COUNTER MEDICATION as needed.    ? tiZANidine (ZANAFLEX) 4 MG tablet Take 1 tablet (4 mg total) by mouth nightly 90 tablet 1  ? traMADol (ULTRAM) 50 MG tablet Take 1 tablet (50 mg total) by mouth every 12 (twelve) hours as needed. 60 tablet 0  ? ?Current Facility-Administered Medications  ?Medication Dose Route Frequency Provider Last Rate Last Admin  ? 0.9 %  sodium chloride infusion  500 mL  Intravenous Once Jeanette Rauth, Venia Minks, MD      ? ? ?Allergies as of 12/13/2021  ? (No Known Allergies)  ? ? ?Family History  ?Problem Relation Age of Onset  ? Hyperlipidemia Mother   ? Hypertension Mother   ? Depression Mother   ? Hypertension Father   ? Bipolar disorder Sister   ?     x 2  ? Colon cancer Neg Hx   ? Colon polyps Neg Hx   ? Esophageal cancer Neg Hx   ? Rectal cancer Neg Hx   ? Stomach cancer Neg Hx   ? ? ?Social History  ? ?Socioeconomic History  ? Marital status: Married  ?  Spouse name: Not on file  ? Number of children: Not on file  ? Years of education: Not on file  ? Highest education level: Not on file  ?Occupational History  ? Not on file  ?Tobacco Use  ? Smoking status: Never  ?  Passive exposure: Never  ? Smokeless tobacco: Never  ? Tobacco comments:  ?  married, lives with spouse (GI doc at [employer]) and 2 kids - previous HS history teacher x 84yr ?Vaping Use  ? Vaping Use: Never used  ?Substance and Sexual Activity  ? Alcohol use: Yes  ?  Comment: social and OCC  ? Drug use: No  ? Sexual activity: Not on file  ?Other Topics Concern  ? Not on file  ?Social History Narrative  ? Not on file  ? ?Social Determinants of Health  ? ?Financial Resource Strain: Not on file  ?Food Insecurity: Not on file  ?Transportation Needs: Not on file  ?Physical Activity: Not on file  ?Stress: Not on file  ?Social Connections: Not on file  ?Intimate Partner Violence: Not on file  ? ? ?Review of Systems: ? ?All other review of systems negative except as mentioned in the HPI. ? ?Physical Exam: ?Vital signs in last 24 hours: ?BP 126/81   Pulse (!) 48   Temp 98 ?F (36.7 ?C)   Ht _0  (1.753 m)   Wt 180 lb (81.6 kg)   LMP 11/23/2021   SpO2 100%   BMI 26.58 kg/m?  ?General:   Alert, NAD ?Lungs:  Clear .   ?Heart:  Regular rate and rhythm ?Abdomen:  Soft, nontender and nondistended. ?Neuro/Psych:  Alert and cooperative. Normal mood and affect. A and O x 3 ? ?Reviewed labs, radiology imaging, old records and  pertinent past GI work up ? ?Patient is appropriate for planned procedure(s) and anesthesia in an ambulatory setting ? ? ?K. VDenzil Magnuson, MD ?[phone] ? ? ?  ?

## 2021-12-13 NOTE — Progress Notes (Signed)
D.T. vital signs. °

## 2021-12-13 NOTE — Patient Instructions (Signed)
Handouts given for hemorrhoids and diverticulosis.  Resume previous diet and continue present medications.  ? ? Repeat colonoscopy in 10 years for surveillance.  ? ?YOU HAD AN ENDOSCOPIC PROCEDURE TODAY AT [facility]:   Refer to the procedure report that was given to you for any specific questions about what was found during the examination.  If the procedure report does not answer your questions, please call your gastroenterologist to clarify.  If you requested that your care partner not be given the details of your procedure findings, then the procedure report has been included in a sealed envelope for you to review at your convenience later. ? ?YOU SHOULD EXPECT: Some feelings of bloating in the abdomen. Passage of more gas than usual.  Walking can help get rid of the air that was put into your GI tract during the procedure and reduce the bloating. If you had a lower endoscopy (such as a colonoscopy or flexible sigmoidoscopy) you may notice spotting of blood in your stool or on the toilet paper. If you underwent a bowel prep for your procedure, you may not have a normal bowel movement for a few days. ? ?Please Note:  You might notice some irritation and congestion in your nose or some drainage.  This is from the oxygen used during your procedure.  There is no need for concern and it should clear up in a day or so. ? ?SYMPTOMS TO REPORT IMMEDIATELY: ? ?Following lower endoscopy (colonoscopy or flexible sigmoidoscopy): ? Excessive amounts of blood in the stool ? Significant tenderness or worsening of abdominal pains ? Swelling of the abdomen that is new, acute ? Fever of 100?F or higher ? ?For urgent or emergent issues, a gastroenterologist can be reached at any hour by calling [phone]. ?Do not use MyChart messaging for urgent concerns.  ? ? ?DIET:  We do recommend a small meal at first, but then you may proceed to your regular diet.  Drink plenty of fluids but you should avoid alcoholic  beverages for 24 hours. ? ?ACTIVITY:  You should plan to take it easy for the rest of today and you should NOT DRIVE or use heavy machinery until tomorrow (because of the sedation medicines used during the test).   ? ?FOLLOW UP: ?Our staff will call the number listed on your records 48-72 hours following your procedure to check on you and address any questions or concerns that you may have regarding the information given to you following your procedure. If we do not reach you, we will leave a message.  We will attempt to reach you two times.  During this call, we will ask if you have developed any symptoms of COVID 19. If you develop any symptoms (ie: fever, flu-like symptoms, shortness of breath, cough etc.) before then, please call [phone].  If you test positive for Covid 19 in the 2 weeks post procedure, please call and report this information to [place].   ? ?If any biopsies were taken you will be contacted by phone or by letter within the next 1-3 weeks.  Please call us at [phone] if you have not heard about the biopsies in 3 weeks.  ? ? ?SIGNATURES/CONFIDENTIALITY: ?You and/or your care partner have signed paperwork which will be entered into your electronic medical record.  These signatures attest to the fact that that the information above on your After Visit Summary has been reviewed and is understood.  Full responsibility of the confidentiality of this discharge information  lies with you and/or your care-partner.  ?

## 2021-12-13 NOTE — Op Note (Addendum)
[facility] ?Patient Name: Shelby [name] ?Procedure Date: 12/13/2021 11:04 AM ?MRN: [id number] ?Endoscopist: Mauri Pole , MD ?Age: 46 ?Referring MD:  ?Date of Birth: Mar 04, 1976 ?Gender: Female ?Account #: [id number] ?Procedure:                Colonoscopy ?Indications:              Screening for colorectal malignant neoplasm ?Medicines:                Monitored Anesthesia Care ?Procedure:                Pre-Anesthesia Assessment: ?                          - Prior to the procedure, a History and Physical  ?                          was performed, and patient medications and  ?                          allergies were reviewed. The patient's tolerance of  ?                          previous anesthesia was also reviewed. The risks  ?                          and benefits of the procedure and the sedation  ?                          options and risks were discussed with the patient.  ?                          All questions were answered, and informed consent  ?                          was obtained. Prior Anticoagulants: The patient has  ?                          taken no previous anticoagulant or antiplatelet  ?                          agents. ASA Grade Assessment: II - A patient with  ?                          mild systemic disease. After reviewing the risks  ?                          and benefits, the patient was deemed in  ?                          satisfactory condition to undergo the procedure. ?                          After obtaining informed consent, the colonoscope  ?  was passed under direct vision. Throughout the  ?                          procedure, the patient's blood pressure, pulse, and  ?                          oxygen saturations were monitored continuously. The  ?                          Olympus PCF-H190DL (#[id number]) Colonoscope was  ?                          introduced through the anus and advanced to the the  ?                          terminal  ileum, with identification of the  ?                          appendiceal orifice and IC valve. The colonoscopy  ?                          was performed without difficulty. The patient  ?                          tolerated the procedure well. The quality of the  ?                          bowel preparation was excellent. The terminal  ?                          ileum, ileocecal valve, appendiceal orifice, and  ?                          rectum were photographed. ?Scope In: 11:10:13 AM ?Scope Out: 11:30:42 AM ?Scope Withdrawal Time: 0 hours 12 minutes 46 seconds  ?Total Procedure Duration: 0 hours 20 minutes 29 seconds  ?Findings:                 The perianal and digital rectal examinations were  ?                          normal. ?                          A few small-mouthed diverticula were found in the  ?                          sigmoid colon and ascending colon. ?                          Anal papilla(e) were hypertrophied. ?                          Non-bleeding external and internal hemorrhoids were  ?  found during retroflexion. The hemorrhoids were  ?                          small. ?                          The exam was otherwise without abnormality. ?Complications:            No immediate complications. ?Estimated Blood Loss:     Estimated blood loss: none. ?Impression:               - Mild diverticulosis in the sigmoid colon and in  ?                          the ascending colon. ?                          - Anal papilla(e) were hypertrophied. ?                          - Non-bleeding external and internal hemorrhoids. ?                          - The examination was otherwise normal. ?                          - No specimens collected. ?Recommendation:           - Patient has a contact number available for  ?                          emergencies. The signs and symptoms of potential  ?                          delayed complications were discussed with the  ?                           patient. Return to normal activities tomorrow.  ?                          Written discharge instructions were provided to the  ?                          patient. ?                          - Resume previous diet. ?                          - Continue present medications. ?                          - Repeat colonoscopy in 10 years for screening  ?                          purposes. ?Mauri Pole, MD ?12/13/2021 11:35:17 AM ?This report has been signed electronically. ?

## 2021-12-13 NOTE — Progress Notes (Signed)
Pt's states no medical or surgical changes since previsit or office visit. 

## 2021-12-15 ENCOUNTER — Telehealth: Payer: Self-pay

## 2021-12-15 NOTE — Telephone Encounter (Signed)
?  Follow up Call- ? ?Call back number 12/13/2021  ?Post procedure Call Back phone  # [phone]  ?Permission to leave phone message Yes  ?Some recent data might be hidden  ?  ? ?Patient questions: ? ?Do you have a fever, pain , or abdominal swelling? No. ?Pain Score  0 * ? ?Have you tolerated food without any problems? Yes.   ? ?Have you been able to return to your normal activities? Yes.   ? ?Do you have any questions about your discharge instructions: ?Diet   No. ?Medications  No. ?Follow up visit  No. ? ?Do you have questions or concerns about your Care? No. ? ?Actions: ?* If pain score is 4 or above: ?No action needed, pain <4. ? ? ?

## 2022-01-04 DIAGNOSIS — E538 Deficiency of other specified B group vitamins: Secondary | ICD-10-CM | POA: Diagnosis not present

## 2022-01-04 DIAGNOSIS — I10 Essential (primary) hypertension: Secondary | ICD-10-CM | POA: Diagnosis not present

## 2022-01-08 ENCOUNTER — Other Ambulatory Visit: Payer: Self-pay | Admitting: Family Medicine

## 2022-01-08 ENCOUNTER — Other Ambulatory Visit (HOSPITAL_COMMUNITY): Payer: Self-pay

## 2022-01-08 DIAGNOSIS — F419 Anxiety disorder, unspecified: Secondary | ICD-10-CM | POA: Diagnosis not present

## 2022-01-08 DIAGNOSIS — I10 Essential (primary) hypertension: Secondary | ICD-10-CM | POA: Diagnosis not present

## 2022-01-08 DIAGNOSIS — Z23 Encounter for immunization: Secondary | ICD-10-CM | POA: Diagnosis not present

## 2022-01-08 DIAGNOSIS — E538 Deficiency of other specified B group vitamins: Secondary | ICD-10-CM | POA: Diagnosis not present

## 2022-01-08 DIAGNOSIS — R7989 Other specified abnormal findings of blood chemistry: Secondary | ICD-10-CM | POA: Diagnosis not present

## 2022-01-08 DIAGNOSIS — R82998 Other abnormal findings in urine: Secondary | ICD-10-CM | POA: Diagnosis not present

## 2022-01-08 DIAGNOSIS — M47816 Spondylosis without myelopathy or radiculopathy, lumbar region: Secondary | ICD-10-CM | POA: Diagnosis not present

## 2022-01-08 DIAGNOSIS — Z1331 Encounter for screening for depression: Secondary | ICD-10-CM | POA: Diagnosis not present

## 2022-01-08 DIAGNOSIS — Z0000 Encounter for general adult medical examination without abnormal findings: Secondary | ICD-10-CM | POA: Diagnosis not present

## 2022-01-08 DIAGNOSIS — J309 Allergic rhinitis, unspecified: Secondary | ICD-10-CM | POA: Diagnosis not present

## 2022-01-09 ENCOUNTER — Other Ambulatory Visit (HOSPITAL_COMMUNITY): Payer: Self-pay

## 2022-01-09 MED ORDER — VENLAFAXINE HCL ER 37.5 MG PO CP24
37.5000 mg | ORAL_CAPSULE | Freq: Every day | ORAL | 0 refills | Status: DC
Start: 2022-01-09 — End: 2022-04-12
  Filled 2022-01-09: qty 90, 90d supply, fill #0

## 2022-01-10 ENCOUNTER — Other Ambulatory Visit (HOSPITAL_COMMUNITY): Payer: Self-pay

## 2022-01-10 MED ORDER — TELMISARTAN 40 MG PO TABS
40.0000 mg | ORAL_TABLET | Freq: Every day | ORAL | 3 refills | Status: DC
  Filled 2022-01-10: qty 90, 90d supply, fill #0
  Filled 2022-04-12 – 2022-04-24 (×2): qty 90, 90d supply, fill #1
  Filled 2022-06-28 – 2022-07-02 (×2): qty 90, 90d supply, fill #2
  Filled 2022-10-30: qty 90, 90d supply, fill #3

## 2022-01-11 ENCOUNTER — Other Ambulatory Visit (HOSPITAL_COMMUNITY): Payer: Self-pay

## 2022-01-24 NOTE — Progress Notes (Signed)
?Charlann Boxer D.O. ?[facility] ?[place] ?Phone: [phone] ?Subjective:   ?I, [name], am serving as a scribe for Dr. Hulan Saas. ? ?This visit occurred during the SARS-CoV-2 public health emergency.  Safety protocols were in place, including screening questions prior to the visit, additional usage of staff PPE, and extensive cleaning of exam room while observing appropriate contact time as indicated for disinfecting solutions.  ? ? ?I'm seeing this patient by the request  of:  Crist Infante, MD ? ?CC: Low back pain follow-up ? ?[id number]  ?Shelby [name] is a 46 y.o. female coming in with complaint of back and neck pain. OMT on 11/30/2021. Patient states that she is feeling a pinch.  ? ?Medications patient has been prescribed: Effexor ? ?Taking: ? ? ?  ? ? ? ? ?Reviewed prior external information including notes and imaging from previsou exam, outside providers and external EMR if available.  ? ?As well as notes that were available from care everywhere and other healthcare systems. ? ?Past medical history, social, surgical and family history all reviewed in electronic medical record.  No pertanent information unless stated regarding to the chief complaint.  ? ?Past Medical History:  ?Diagnosis Date  ? Anxiety   ? Arthritis   ? back  ? GERD (gastroesophageal reflux disease)   ? Hypertension   ? controlled on meds  ? Palpitations   ? ECho and monitor 2022 normal  ?  ?No Known Allergies ? ? ?Review of Systems: ? No headache, visual changes, nausea, vomiting, diarrhea, constipation, dizziness, abdominal pain, skin rash, fevers, chills, night sweats, weight loss, swollen lymph nodes, body aches, joint swelling, chest pain, shortness of breath, mood changes. POSITIVE muscle aches ? ?Objective  ?Blood pressure 102/80, pulse 70, height '5\' 9"'$  (1.753 m), weight 184 lb (83.5 kg), SpO2 98 %. ?  ?General: No apparent distress alert and oriented x3 mood and affect normal,  dressed appropriately.  ?HEENT: Pupils equal, extraocular movements intact  ?Respiratory: Patient's speak in full sentences and does not appear short of breath  ?Cardiovascular: No lower extremity edema, non tender, no erythema  ?Low back exam does have some loss of lordosis. ?Tenderness to palpation noted over the sacroiliac joint.  Seems to be more on the right side.  Also patient has more tightness noted on the right parascapular region.  Patient initially has more discomfort over the left side. ? ?Osteopathic findings ? ?C3 flexed rotated and side bent right ?C5 flexed rotated and side bent left ?T3 extended rotated and side bent right inhaled rib ?T7 extended rotated and side bent left ?L2 flexed rotated and side bent right ?Sacrum right on right ? ? ? ? ?  ?Assessment and Plan: ? ?SI (sacroiliac) joint dysfunction ?Seems to be more secondary to the sacroiliac joint at the moment.  Patient continues to actually lose some weight at the moment.  Encouraged her to continue to do so.  Patient is increasing activity.  Which I am very happy with.  Patient will follow-up with me again in 6 weeks. ?  ? ?Nonallopathic problems ? ?Decision today to treat with OMT was based on Physical Exam ? ?After verbal consent patient was treated with HVLA, ME, FPR techniques in cervical, rib, thoracic, lumbar, and sacral  areas ? ?Patient tolerated the procedure well with improvement in symptoms ? ?Patient given exercises, stretches and lifestyle modifications ? ?See medications in patient instructions if given ? ?Patient will follow up in 4-8  weeks ? ?  ? ? ?The above documentation has been reviewed and is accurate and complete Lyndal Pulley, DO ? ? ?  ? ? Note: This dictation was prepared with Dragon dictation along with smaller phrase technology. Any transcriptional errors that result from this process are unintentional.    ?  ?  ? ?

## 2022-01-25 ENCOUNTER — Encounter: Payer: Self-pay | Admitting: Family Medicine

## 2022-01-25 ENCOUNTER — Ambulatory Visit (INDEPENDENT_AMBULATORY_CARE_PROVIDER_SITE_OTHER): Payer: 59 | Admitting: Family Medicine

## 2022-01-25 VITALS — BP 102/80 | HR 70 | Ht 69.0 in | Wt 184.0 lb

## 2022-01-25 DIAGNOSIS — M9902 Segmental and somatic dysfunction of thoracic region: Secondary | ICD-10-CM

## 2022-01-25 DIAGNOSIS — M9904 Segmental and somatic dysfunction of sacral region: Secondary | ICD-10-CM

## 2022-01-25 DIAGNOSIS — M9901 Segmental and somatic dysfunction of cervical region: Secondary | ICD-10-CM | POA: Diagnosis not present

## 2022-01-25 DIAGNOSIS — M533 Sacrococcygeal disorders, not elsewhere classified: Secondary | ICD-10-CM | POA: Diagnosis not present

## 2022-01-25 DIAGNOSIS — M9903 Segmental and somatic dysfunction of lumbar region: Secondary | ICD-10-CM

## 2022-01-25 DIAGNOSIS — M9908 Segmental and somatic dysfunction of rib cage: Secondary | ICD-10-CM

## 2022-01-25 NOTE — Assessment & Plan Note (Signed)
Seems to be more secondary to the sacroiliac joint at the moment.  Patient continues to actually lose some weight at the moment.  Encouraged her to continue to do so.  Patient is increasing activity.  Which I am very happy with.  Patient will follow-up with me again in 6 weeks. ?

## 2022-03-07 NOTE — [facility] Notes (Unsigned)
Shelby [name] [address] [place] Phone: [phone] Subjective:   IVilma [name], am serving as a scribe for Dr. Hulan Saas.  I'm seeing this patient by the request  of:  Shelby Infante, MD  CC: back and neck pain foloow up   [id number]  AYAT Shelby [name] is a 46 y.o. female coming in with complaint of back and neck pain. OMT on 01/25/2022. Patient states same per usual. Feeling good. No new complaints.  Medications patient has been prescribed: Effexor  Taking: yes          Reviewed prior external information including notes and imaging from previsou exam, outside providers and external EMR if available.   As well as notes that were available from care everywhere and other healthcare systems.  Past medical history, social, surgical and family history all reviewed in electronic medical record.  No pertanent information unless stated regarding to the chief complaint.   Past Medical History:  Diagnosis Date   Anxiety    Arthritis    back   GERD (gastroesophageal reflux disease)    Hypertension    controlled on meds   Palpitations    ECho and monitor 2022 normal      Review of Systems:  No headache, visual changes, nausea, vomiting, diarrhea, constipation, dizziness, abdominal pain, skin rash, fevers, chills, night sweats, weight loss, swollen lymph nodes, body aches, joint swelling, chest pain, shortness of breath, mood changes. POSITIVE muscle aches  Objective  Blood pressure 118/76, pulse 70, height '5\' 9"'$  (1.753 m), weight 186 lb (84.4 kg), SpO2 97 %.   General: No apparent distress alert and oriented x3 mood and affect normal, dressed appropriately.  HEENT: Pupils equal, extraocular movements intact  Respiratory: Patient's speak in full sentences and does not appear short of breath  Cardiovascular: No lower extremity edema, non tender, no erythema  Patient back exam shows significant tightness noted in the  paraspinal musculature mostly in the lumbar spine.  Seems to be more on the right side than the left today.  Negative straight leg test though noted.  Osteopathic findings  C2 flexed rotated and side bent right C5 flexed rotated and side bent left T3 extended rotated and side bent right inhaled rib T8 extended rotated and side bent left L2 flexed rotated and side bent right L4 flexed rotated and side bent right  Sacrum right on right     Assessment and Plan:  Acute lumbar radiculopathy Low back does have loss of lordosis but no radicular symptoms at the moment.  Patient did have some increasing in tightness noted.  Discussed potentially using the muscle relaxer on a more regular basis if needed.  Discussed icing regimen and home exercises.  We will follow-up with me again in 6 to 8 weeks.    Nonallopathic problems  Decision today to treat with OMT was based on Physical Exam  After verbal consent patient was treated with HVLA, ME, FPR techniques in cervical, rib, thoracic, lumbar, and sacral  areas  Patient tolerated the procedure well with improvement in symptoms  Patient given exercises, stretches and lifestyle modifications  See medications in patient instructions if given  Patient will follow up in 4-8 weeks      The above documentation has been reviewed and is accurate and complete Lyndal Pulley, DO        Note: This dictation was prepared with Dragon dictation along with smaller phrase technology. Any transcriptional errors that result from  this process are unintentional.

## 2022-03-08 ENCOUNTER — Ambulatory Visit: Payer: 59 | Admitting: Family Medicine

## 2022-03-08 VITALS — BP 118/76 | HR 70 | Ht 69.0 in | Wt 186.0 lb

## 2022-03-08 DIAGNOSIS — M9903 Segmental and somatic dysfunction of lumbar region: Secondary | ICD-10-CM | POA: Diagnosis not present

## 2022-03-08 DIAGNOSIS — M9902 Segmental and somatic dysfunction of thoracic region: Secondary | ICD-10-CM

## 2022-03-08 DIAGNOSIS — M9901 Segmental and somatic dysfunction of cervical region: Secondary | ICD-10-CM | POA: Diagnosis not present

## 2022-03-08 DIAGNOSIS — M9904 Segmental and somatic dysfunction of sacral region: Secondary | ICD-10-CM

## 2022-03-08 DIAGNOSIS — M9908 Segmental and somatic dysfunction of rib cage: Secondary | ICD-10-CM

## 2022-03-08 DIAGNOSIS — M5416 Radiculopathy, lumbar region: Secondary | ICD-10-CM

## 2022-03-08 NOTE — Assessment & Plan Note (Signed)
Low back does have loss of lordosis but no radicular symptoms at the moment.  Patient did have some increasing in tightness noted.  Discussed potentially using the muscle relaxer on a more regular basis if needed.  Discussed icing regimen and home exercises.  We will follow-up with me again in 6 to 8 weeks.

## 2022-03-08 NOTE — Patient Instructions (Signed)
Good to see you! See you again in 6 weeks 

## 2022-03-18 ENCOUNTER — Other Ambulatory Visit: Payer: Self-pay | Admitting: Family Medicine

## 2022-03-19 ENCOUNTER — Other Ambulatory Visit (HOSPITAL_COMMUNITY): Payer: Self-pay

## 2022-03-19 MED ORDER — TIZANIDINE HCL 4 MG PO TABS
4.0000 mg | ORAL_TABLET | Freq: Every evening | ORAL | 1 refills | Status: DC
  Filled 2022-03-19: qty 90, 90d supply, fill #0
  Filled 2022-06-14: qty 90, 90d supply, fill #1

## 2022-03-21 ENCOUNTER — Other Ambulatory Visit (HOSPITAL_COMMUNITY): Payer: Self-pay

## 2022-04-11 ENCOUNTER — Encounter: Payer: Self-pay | Admitting: Family Medicine

## 2022-04-12 ENCOUNTER — Other Ambulatory Visit: Payer: Self-pay | Admitting: Family Medicine

## 2022-04-13 ENCOUNTER — Other Ambulatory Visit (HOSPITAL_COMMUNITY): Payer: Self-pay

## 2022-04-13 MED ORDER — VENLAFAXINE HCL ER 37.5 MG PO CP24
37.5000 mg | ORAL_CAPSULE | Freq: Every day | ORAL | 0 refills | Status: DC
Start: 2022-04-13 — End: 2022-06-28
  Filled 2022-04-13 – 2022-05-21 (×2): qty 90, 90d supply, fill #0

## 2022-04-18 ENCOUNTER — Ambulatory Visit: Payer: 59 | Admitting: Family Medicine

## 2022-04-23 ENCOUNTER — Other Ambulatory Visit (HOSPITAL_COMMUNITY): Payer: Self-pay

## 2022-04-24 ENCOUNTER — Other Ambulatory Visit (HOSPITAL_COMMUNITY): Payer: Self-pay

## 2022-04-25 ENCOUNTER — Encounter: Payer: Self-pay | Admitting: Family Medicine

## 2022-04-26 ENCOUNTER — Other Ambulatory Visit: Payer: Self-pay

## 2022-04-26 DIAGNOSIS — M5416 Radiculopathy, lumbar region: Secondary | ICD-10-CM

## 2022-04-26 NOTE — Progress Notes (Signed)
[name], am serving as a scribe for Dr. Hulan Saas.  [place] Phone: [phone] Subjective:    I'm seeing this patient by the request  of:  Crist Infante, MD  CC: Back pain follow-up  [id number]  Shelby [name] is a 46 y.o. female coming in with complaint of back and neck pain. OMT on 03/08/2022. Had epidural yesterday  Patient states that she was not locked up today so already has felt a difference. Patient is having upper back tension as she has been playing more tennis lately.   Medications patient has been prescribed: Effexor Zanaflex  Taking:         Reviewed prior external information including notes and imaging from previsou exam, outside providers and external EMR if available.   As well as notes that were available from care everywhere and other healthcare systems.  Past medical history, social, surgical and family history all reviewed in electronic medical record.  No pertanent information unless stated regarding to the chief complaint.   Past Medical History:  Diagnosis Date   Anxiety    Arthritis    back   GERD (gastroesophageal reflux disease)    Hypertension    controlled on meds   Palpitations    ECho and monitor 2022 normal    No Known Allergies   Review of Systems:  No headache, visual changes, nausea, vomiting, diarrhea, constipation, dizziness, abdominal pain, skin rash, fevers, chills, night sweats, weight loss, swollen lymph nodes, body aches, joint swelling, chest pain, shortness of breath, mood changes. POSITIVE muscle aches  Objective  Blood pressure 110/80, pulse (!) 103, height '5\' 9"'$  (1.753 m), last menstrual period 04/11/2022, SpO2 99 %.   General: No apparent distress alert and oriented x3 mood and affect normal, dressed appropriately.  HEENT: Pupils equal, extraocular movements intact  Respiratory: Patient's speak in full sentences and does not appear  short of breath  Cardiovascular: No lower extremity edema, non tender, no erythema  Gait normal  MSK:  Back tenderness noted around the hips bilaterally.  Patient seems to have more pain on the right than the left.  Patient's neck does have some mild loss of sidebending bilaterally as well.  Osteopathic findings  C6 flexed rotated and side bent left T3 extended rotated and side bent right inhaled rib T9 extended rotated and side bent left Sacrum right on right Pelvic shear noted    Assessment and Plan:  SI (sacroiliac) joint dysfunction Patient did have a pelvic tilt.  We did attempt osteopathic manipulation.  Avoided the lumbar spine no secondary to patient recently had any epidural.  Responded well though to be manipulation of the hip as well as the upper back.  Follow-up with me again in 6 to 8 weeks otherwise.    Nonallopathic problems  Decision today to treat with OMT was based on Physical Exam  After verbal consent patient was treated with HVLA, ME, FPR techniques in cervical, rib, thoracic, pelvis, and sacral  areas  Patient tolerated the procedure well with improvement in symptoms  Patient given exercises, stretches and lifestyle modifications  See medications in patient instructions if given  Patient will follow up in 4-8 weeks     The above documentation has been reviewed and is accurate and complete Shelby Pulley, DO         Note: This dictation was prepared with Dragon dictation along with smaller phrase technology. Any transcriptional errors that  result from this process are unintentional.

## 2022-04-30 ENCOUNTER — Ambulatory Visit
Admission: RE | Admit: 2022-04-30 | Discharge: 2022-04-30 | Disposition: A | Discharge status: Home / Self Care | Payer: 59 | Source: Ambulatory Visit | Attending: Family Medicine | Admitting: Family Medicine

## 2022-04-30 DIAGNOSIS — M5416 Radiculopathy, lumbar region: Secondary | ICD-10-CM

## 2022-04-30 DIAGNOSIS — M47817 Spondylosis without myelopathy or radiculopathy, lumbosacral region: Secondary | ICD-10-CM | POA: Diagnosis not present

## 2022-04-30 DIAGNOSIS — M5126 Other intervertebral disc displacement, lumbar region: Secondary | ICD-10-CM | POA: Diagnosis not present

## 2022-04-30 MED ORDER — IOPAMIDOL (ISOVUE-M 200) INJECTION 41%
1.0000 mL | Freq: Once | INTRAMUSCULAR | Status: AC
  Administered 2022-04-30: 1 mL via EPIDURAL

## 2022-04-30 MED ORDER — METHYLPREDNISOLONE ACETATE 40 MG/ML INJ SUSP (RADIOLOG
80.0000 mg | Freq: Once | INTRAMUSCULAR | Status: AC
  Administered 2022-04-30: 80 mg via EPIDURAL

## 2022-04-30 NOTE — Discharge Instructions (Signed)

## 2022-05-01 ENCOUNTER — Ambulatory Visit: Payer: 59 | Admitting: Family Medicine

## 2022-05-01 VITALS — BP 110/80 | HR 103 | Ht 69.0 in

## 2022-05-01 DIAGNOSIS — M9905 Segmental and somatic dysfunction of pelvic region: Secondary | ICD-10-CM

## 2022-05-01 DIAGNOSIS — M9908 Segmental and somatic dysfunction of rib cage: Secondary | ICD-10-CM | POA: Diagnosis not present

## 2022-05-01 DIAGNOSIS — M9901 Segmental and somatic dysfunction of cervical region: Secondary | ICD-10-CM | POA: Diagnosis not present

## 2022-05-01 DIAGNOSIS — M533 Sacrococcygeal disorders, not elsewhere classified: Secondary | ICD-10-CM | POA: Diagnosis not present

## 2022-05-01 DIAGNOSIS — M9902 Segmental and somatic dysfunction of thoracic region: Secondary | ICD-10-CM | POA: Diagnosis not present

## 2022-05-01 DIAGNOSIS — M9904 Segmental and somatic dysfunction of sacral region: Secondary | ICD-10-CM | POA: Diagnosis not present

## 2022-05-01 NOTE — Assessment & Plan Note (Signed)
Patient did have a pelvic tilt.  We did attempt osteopathic manipulation.  Avoided the lumbar spine no secondary to patient recently had any epidural.  Responded well though to be manipulation of the hip as well as the upper back.  Follow-up with me again in 6 to 8 weeks otherwise.

## 2022-05-02 ENCOUNTER — Ambulatory Visit (INDEPENDENT_AMBULATORY_CARE_PROVIDER_SITE_OTHER): Payer: 59

## 2022-05-02 DIAGNOSIS — E538 Deficiency of other specified B group vitamins: Secondary | ICD-10-CM

## 2022-05-02 MED ORDER — CYANOCOBALAMIN 1000 MCG/ML IJ SOLN
1000.0000 ug | Freq: Once | INTRAMUSCULAR | Status: AC
  Administered 2022-05-02: 1000 ug via INTRAMUSCULAR

## 2022-05-02 NOTE — Progress Notes (Signed)
Patient given B12 injection per Dr. Tamala Julian. Given in the Left Deltoid and patient tolerated injection well.

## 2022-05-21 ENCOUNTER — Other Ambulatory Visit (HOSPITAL_COMMUNITY): Payer: Self-pay

## 2022-05-22 ENCOUNTER — Other Ambulatory Visit (HOSPITAL_COMMUNITY): Payer: Self-pay

## 2022-05-29 NOTE — Progress Notes (Unsigned)
  [place] Phone: [phone] Subjective:   [name], am serving as a scribe for Dr. Hulan Saas.  I'm seeing this patient by the request  of:  Crist Infante, MD  CC: Neck and back pain follow-up  [id number]  ZARYAH SECKEL is a 46 y.o. female coming in with complaint of back and neck pain. OMT on 05/01/2022. Patient states that she is the same as last visit. Unsure if epidural is working or if yardwork has exacerbated her pain.   Medications patient has been prescribed: Zanafelx Effexor  Taking:         Reviewed prior external information including notes and imaging from previsou exam, outside providers and external EMR if available.   As well as notes that were available from care everywhere and other healthcare systems.  Past medical history, social, surgical and family history all reviewed in electronic medical record.  No pertanent information unless stated regarding to the chief complaint.   Past Medical History:  Diagnosis Date   Anxiety    Arthritis    back   GERD (gastroesophageal reflux disease)    Hypertension    controlled on meds   Palpitations    ECho and monitor 2022 normal    No Known Allergies   Review of Systems:  No headache, visual changes, nausea, vomiting, diarrhea, constipation, dizziness, abdominal pain, skin rash, fevers, chills, night sweats, weight loss, swollen lymph nodes, body aches, joint swelling, chest pain, shortness of breath, mood changes. POSITIVE muscle aches  Objective  Blood pressure 122/84, pulse 62, height '5\' 9"'$  (1.753 m), weight 190 lb (86.2 kg), last menstrual period 04/11/2022, SpO2 98 %.   General: No apparent distress alert and oriented x3 mood and affect normal, dressed appropriately.  HEENT: Pupils equal, extraocular movements intact  Respiratory: Patient's speak in full sentences and does not appear short of breath  Cardiovascular: No  lower extremity edema, non tender, no erythema  MSK:  Back does have some loss of lordosis tightness TL on the right and right SI joint    Osteopathic findings   T9 extended rotated and side bent left L2 flexed rotated and side bent right Sacrum right on right       Assessment and Plan:  SI (sacroiliac) joint dysfunction Patient is having more tightness noted.  Responded extremely well to osteopathic manipulation today.  Hoping that this will be more beneficial.  Has had tightness of the piriformis as well.  Did have the epidural and had been a year.  I am optimistic though I did make things a little easier for the osteopathic manipulation.  Has Zanaflex for breakthrough.  Follow-up again in 6 to 8 weeks    Nonallopathic problems  Decision today to treat with OMT was based on Physical Exam  After verbal consent patient was treated with HVLA, ME, FPR techniques in  thoracic, lumbar, and sacral  areas  Patient tolerated the procedure well with improvement in symptoms  Patient given exercises, stretches and lifestyle modifications  See medications in patient instructions if given  Patient will follow up in 4-8 weeks     The above documentation has been reviewed and is accurate and complete Lyndal Pulley, DO         Note: This dictation was prepared with Dragon dictation along with smaller phrase technology. Any transcriptional errors that result from this process are unintentional.

## 2022-05-30 ENCOUNTER — Ambulatory Visit: Payer: 59 | Admitting: Family Medicine

## 2022-05-30 VITALS — BP 122/84 | HR 62 | Ht 69.0 in | Wt 190.0 lb

## 2022-05-30 DIAGNOSIS — M9903 Segmental and somatic dysfunction of lumbar region: Secondary | ICD-10-CM

## 2022-05-30 DIAGNOSIS — M533 Sacrococcygeal disorders, not elsewhere classified: Secondary | ICD-10-CM | POA: Diagnosis not present

## 2022-05-30 DIAGNOSIS — M9904 Segmental and somatic dysfunction of sacral region: Secondary | ICD-10-CM | POA: Diagnosis not present

## 2022-05-30 DIAGNOSIS — M9902 Segmental and somatic dysfunction of thoracic region: Secondary | ICD-10-CM

## 2022-05-30 NOTE — Assessment & Plan Note (Signed)
Patient is having more tightness noted.  Responded extremely well to osteopathic manipulation today.  Hoping that this will be more beneficial.  Has had tightness of the piriformis as well.  Did have the epidural and had been a year.  I am optimistic though I did make things a little easier for the osteopathic manipulation.  Has Zanaflex for breakthrough.  Follow-up again in 6 to 8 weeks

## 2022-06-14 ENCOUNTER — Other Ambulatory Visit (HOSPITAL_COMMUNITY): Payer: Self-pay

## 2022-06-28 ENCOUNTER — Other Ambulatory Visit (HOSPITAL_COMMUNITY): Payer: Self-pay

## 2022-06-28 ENCOUNTER — Other Ambulatory Visit: Payer: Self-pay | Admitting: Family Medicine

## 2022-06-28 MED ORDER — VENLAFAXINE HCL ER 37.5 MG PO CP24
37.5000 mg | ORAL_CAPSULE | Freq: Every day | ORAL | 0 refills | Status: DC
  Filled 2022-06-28: qty 90, 90d supply, fill #0

## 2022-07-02 ENCOUNTER — Other Ambulatory Visit (HOSPITAL_COMMUNITY): Payer: Self-pay

## 2022-07-05 ENCOUNTER — Other Ambulatory Visit (HOSPITAL_COMMUNITY): Payer: Self-pay

## 2022-07-10 NOTE — Progress Notes (Unsigned)
  [place] Phone: [phone] Subjective:   Shelby [name], am serving as a scribe for Dr. Hulan Saas.  I'm seeing this patient by the request  of:  Crist Infante, MD  CC: Back and neck pain follow-up  [id number]  Shelby [name] is a 46 y.o. female coming in with complaint of back and neck pain. OMT on 05/30/2022. Patient states that she had increase in shoulder and neck with serving.   Medications patient has been prescribed: Effexor Zanaflex  Taking:         Reviewed prior external information including notes and imaging from previsou exam, outside providers and external EMR if available.   As well as notes that were available from care everywhere and other healthcare systems.  Past medical history, social, surgical and family history all reviewed in electronic medical record.  [name] information unless stated regarding to the chief complaint.   Past Medical History:  Diagnosis Date   Anxiety    Arthritis    back   GERD (gastroesophageal reflux disease)    Hypertension    controlled on meds   Palpitations    ECho and monitor 2022 normal    [name] Known Allergies   Review of Systems:  [name] headache, visual changes, nausea, vomiting, diarrhea, constipation, dizziness, abdominal pain, skin rash, fevers, chills, night sweats, weight loss, swollen lymph nodes, body aches, joint swelling, chest pain, shortness of breath, mood changes. POSITIVE muscle aches  Objective  Blood pressure 120/78, pulse 81, height '5\' 9"'$  (1.753 m), weight 189 lb (85.7 kg), SpO2 98 %.   General: [name] apparent distress alert and oriented x3 mood and affect normal, dressed appropriately.  HEENT: Pupils equal, extraocular movements intact  Respiratory: Patient's speak in full sentences and does not appear short of breath  Cardiovascular: [name] lower extremity edema, non tender, [name] erythema  Low back exam shows tightness  noted over the greater trochanteric areas bilaterally.  Tightness with FABER test.  Tender to palpation over the sacroiliac joint right greater than left  Osteopathic findings  C2 flexed rotated and side bent right C5 flexed rotated and side bent left T3 extended rotated and side bent right inhaled rib T6 extended rotated and side bent left L2 flexed rotated and side bent right Sacrum right on right       Assessment and Plan:  Acute lumbar radiculopathy Chronic tightness noted today.  Discussed icing regimen and home exercises, which activities to do which ones to avoid.  Increase activity slowly.  Follow-up again in 6 to 8 weeks.    Nonallopathic problems  Decision today to treat with OMT was based on Physical Exam  After verbal consent patient was treated with HVLA, ME, FPR techniques in cervical, rib, thoracic, lumbar, and sacral  areas  Patient tolerated the procedure well with improvement in symptoms  Patient given exercises, stretches and lifestyle modifications  See medications in patient instructions if given  Patient will follow up in 4-8 weeks     The above documentation has been reviewed and is accurate and complete Lyndal Pulley, DO         Note: This dictation was prepared with Dragon dictation along with smaller phrase technology. Any transcriptional errors that result from this process are unintentional.

## 2022-07-11 ENCOUNTER — Encounter: Payer: Self-pay | Admitting: Family Medicine

## 2022-07-11 ENCOUNTER — Ambulatory Visit: Payer: 59 | Admitting: Family Medicine

## 2022-07-11 VITALS — BP 120/78 | HR 81 | Ht 69.0 in | Wt 189.0 lb

## 2022-07-11 DIAGNOSIS — M9902 Segmental and somatic dysfunction of thoracic region: Secondary | ICD-10-CM

## 2022-07-11 DIAGNOSIS — M9901 Segmental and somatic dysfunction of cervical region: Secondary | ICD-10-CM | POA: Diagnosis not present

## 2022-07-11 DIAGNOSIS — M5416 Radiculopathy, lumbar region: Secondary | ICD-10-CM

## 2022-07-11 DIAGNOSIS — M9903 Segmental and somatic dysfunction of lumbar region: Secondary | ICD-10-CM

## 2022-07-11 DIAGNOSIS — M9908 Segmental and somatic dysfunction of rib cage: Secondary | ICD-10-CM

## 2022-07-11 DIAGNOSIS — M9904 Segmental and somatic dysfunction of sacral region: Secondary | ICD-10-CM | POA: Diagnosis not present

## 2022-07-11 NOTE — Patient Instructions (Addendum)
Good to see you as always Keep kicking ass in tennis  Follow up in 5-6 weeks

## 2022-07-11 NOTE — Assessment & Plan Note (Signed)
Chronic tightness noted today.  Discussed icing regimen and home exercises, which activities to do which ones to avoid.  Increase activity slowly.  Follow-up again in 6 to 8 weeks.

## 2022-08-01 ENCOUNTER — Other Ambulatory Visit (HOSPITAL_COMMUNITY): Payer: Self-pay

## 2022-08-01 MED ORDER — VALACYCLOVIR HCL 1 G PO TABS
1000.0000 mg | ORAL_TABLET | Freq: Three times a day (TID) | ORAL | 0 refills | Status: DC
  Filled 2022-08-01: qty 21, 7d supply, fill #0

## 2022-08-02 ENCOUNTER — Other Ambulatory Visit (HOSPITAL_COMMUNITY): Payer: Self-pay

## 2022-08-02 DIAGNOSIS — B029 Zoster without complications: Secondary | ICD-10-CM | POA: Diagnosis not present

## 2022-08-02 MED ORDER — PREDNISOLONE ACETATE 1 % OP SUSP
1.0000 [drp] | Freq: Three times a day (TID) | OPHTHALMIC | 0 refills | Status: AC
  Filled 2022-08-02: qty 5, 7d supply, fill #0

## 2022-08-03 ENCOUNTER — Other Ambulatory Visit (HOSPITAL_COMMUNITY): Payer: Self-pay

## 2022-08-14 NOTE — Progress Notes (Unsigned)
  [place] Phone: [phone] Subjective:   [name], am serving as a scribe for Dr. Hulan Saas.  I'm seeing this patient by the request  of:  Crist Infante, MD  CC: Low back pain follow-up  [id number]  [name] is a 46 y.o. female coming in with complaint of back and neck pain. OMT 07/11/2022. Patient states that she is doing well. No new issues since last visit.   Medications patient has been prescribed: Zanaflex  Taking: Yes         Reviewed prior external information including notes and imaging from previsou exam, outside providers and external EMR if available.   As well as notes that were available from care everywhere and other healthcare systems.  Past medical history, social, surgical and family history all reviewed in electronic medical record.  No pertanent information unless stated regarding to the chief complaint.   Past Medical History:  Diagnosis Date   Anxiety    Arthritis    back   GERD (gastroesophageal reflux disease)    Hypertension    controlled on meds   Palpitations    ECho and monitor 2022 normal    No Known Allergies   Review of Systems:  No headache, visual changes, nausea, vomiting, diarrhea, constipation, dizziness, abdominal pain, skin rash, fevers, chills, night sweats, weight loss, swollen lymph nodes, body aches, joint swelling, chest pain, shortness of breath, mood changes. POSITIVE muscle aches  Objective  Blood pressure 122/68, pulse 71, height '5\' 9"'$  (1.753 m), weight 188 lb 3.2 oz (85.4 kg), SpO2 99 %.   General: No apparent distress alert and oriented x3 mood and affect normal, dressed appropriately.  HEENT: Pupils equal, extraocular movements intact  Respiratory: Patient's speak in full sentences and does not appear short of breath  Cardiovascular: No lower extremity edema, non tender, no erythema  Continue tightness of the back noted.   Patient also does have what appears to be anterior rotation of the ilium.  Osteopathic findings   T9 extended rotated and side bent left L2 flexed rotated and side bent right Sacrum right on right       Assessment and Plan:  SI (sacroiliac) joint dysfunction Continued difficulty with muscle imbalances.  Patient continues with the Effexor.  Patient has had radicular symptoms and has responded well to facet injections previously increase activity slowly otherwise.  Follow-up again in 6 to 8 weeks.    Nonallopathic problems  Decision today to treat with OMT was based on Physical Exam  After verbal consent patient was treated with HVLA, ME, FPR techniques in  thoracic, lumbar, and sacral  areas  Patient tolerated the procedure well with improvement in symptoms  Patient given exercises, stretches and lifestyle modifications  See medications in patient instructions if given  Patient will follow up in 6-8 weeks    The above documentation has been reviewed and is accurate and complete Lyndal Pulley, DO          Note: This dictation was prepared with Dragon dictation along with smaller phrase technology. Any transcriptional errors that result from this process are unintentional.

## 2022-08-15 ENCOUNTER — Ambulatory Visit: Payer: 59 | Admitting: Family Medicine

## 2022-08-15 VITALS — BP 122/68 | HR 71 | Ht 69.0 in | Wt 188.2 lb

## 2022-08-15 DIAGNOSIS — M9902 Segmental and somatic dysfunction of thoracic region: Secondary | ICD-10-CM

## 2022-08-15 DIAGNOSIS — M533 Sacrococcygeal disorders, not elsewhere classified: Secondary | ICD-10-CM | POA: Diagnosis not present

## 2022-08-15 DIAGNOSIS — M9904 Segmental and somatic dysfunction of sacral region: Secondary | ICD-10-CM

## 2022-08-15 DIAGNOSIS — M9903 Segmental and somatic dysfunction of lumbar region: Secondary | ICD-10-CM

## 2022-08-16 ENCOUNTER — Encounter: Payer: Self-pay | Admitting: Family Medicine

## 2022-08-16 NOTE — Assessment & Plan Note (Signed)
Continued difficulty with muscle imbalances.  Patient continues with the Effexor.  Patient has had radicular symptoms and has responded well to facet injections previously increase activity slowly otherwise.  Follow-up again in 6 to 8 weeks.

## 2022-09-07 NOTE — Progress Notes (Signed)
[place] Phone: [phone] Subjective:   Shelby Shelby [name], am serving as a scribe for Dr. Hulan Saas.  I'm seeing this patient by the request  of:  Crist Infante, MD  CC: back and neck pain   [id number]  Shelby Shelby [name] is a 46 y.o. female coming in with complaint of back and neck pain. OMT 08/15/2022. Patient states still some discomfort.  Patient now has been able to be relatively active though.  Patient states that very mild discomfort at all times.  Medications patient has been prescribed: Zanaflex  Taking:         Reviewed prior external information including notes and imaging from previsou exam, outside providers and external EMR if available.   As well as notes that were available from care everywhere and other healthcare systems.  Past medical history, social, surgical and family history all reviewed in electronic medical record.  Shelby [name] information unless stated regarding to the chief complaint.   Past Medical History:  Diagnosis Date   Anxiety    Arthritis    back   GERD (gastroesophageal reflux disease)    Hypertension    controlled on meds   Palpitations    ECho and monitor 2022 normal    Shelby [name] Known Allergies   Review of Systems:  Shelby [name] headache, visual changes, nausea, vomiting, diarrhea, constipation, dizziness, abdominal pain, skin rash, fevers, chills, night sweats, weight loss, swollen lymph nodes, body aches, joint swelling, chest pain, shortness of breath, mood changes. POSITIVE muscle aches  Objective  Blood pressure 110/82, pulse 84, height '5\' 9"'$  (1.753 m), weight 189 lb (85.7 kg), SpO2 99 %.   General: Shelby [name] apparent distress alert and oriented x3 mood and affect normal, dressed appropriately.  HEENT: Pupils equal, extraocular movements intact  Respiratory: Patient's speak in full sentences and does not appear short of breath  Cardiovascular: Shelby [name] lower extremity edema,  non tender, Shelby [name] erythema  Low back and neck pain continues to be there.  Low back does have some loss of lordosis.  Still tightness noted around the right greater than left.  Patient does have some tenderness over the right sacroiliac joint.  Positive Corky Sox.  Worsening pain with extension of the back  Osteopathic findings  C2 flexed rotated and side bent right C7 flexed rotated and side bent left T3 extended rotated and side bent right inhaled rib L3 flexed rotated and side bent left Sacrum right on right       Assessment and Plan:  SI (sacroiliac) joint dysfunction Chronic problem with mild exacerbation.  Continue the Effexor which was refilled today at the same dosing patient has been on.  Seems to be keeping the pain today.  Discussed icing regimen and home exercises, discussed which activities to do and which ones to avoid.  Increase activity slowly.  Follow-up again in 6 to 8 weeks    Nonallopathic problems  Decision today to treat with OMT was based on Physical Exam  After verbal consent patient was treated with HVLA, ME, FPR techniques in cervical, rib, thoracic, lumbar, and sacral  areas  Patient tolerated the procedure well with improvement in symptoms  Patient given exercises, stretches and lifestyle modifications  See medications in patient instructions if given  Patient will follow up in 4-8 weeks    The above documentation has been reviewed and is accurate and complete Shelby Pulley, DO          Note:  This dictation was prepared with Dragon dictation along with smaller phrase technology. Any transcriptional errors that result from this process are unintentional.

## 2022-09-11 ENCOUNTER — Encounter: Payer: Self-pay | Admitting: Family Medicine

## 2022-09-11 ENCOUNTER — Ambulatory Visit: Payer: 59 | Admitting: Family Medicine

## 2022-09-11 ENCOUNTER — Other Ambulatory Visit (HOSPITAL_COMMUNITY): Payer: Self-pay

## 2022-09-11 VITALS — BP 110/82 | HR 84 | Ht 69.0 in | Wt 189.0 lb

## 2022-09-11 DIAGNOSIS — M9908 Segmental and somatic dysfunction of rib cage: Secondary | ICD-10-CM | POA: Diagnosis not present

## 2022-09-11 DIAGNOSIS — M9903 Segmental and somatic dysfunction of lumbar region: Secondary | ICD-10-CM | POA: Diagnosis not present

## 2022-09-11 DIAGNOSIS — M9901 Segmental and somatic dysfunction of cervical region: Secondary | ICD-10-CM | POA: Diagnosis not present

## 2022-09-11 DIAGNOSIS — M9902 Segmental and somatic dysfunction of thoracic region: Secondary | ICD-10-CM | POA: Diagnosis not present

## 2022-09-11 DIAGNOSIS — M533 Sacrococcygeal disorders, not elsewhere classified: Secondary | ICD-10-CM

## 2022-09-11 DIAGNOSIS — M9904 Segmental and somatic dysfunction of sacral region: Secondary | ICD-10-CM

## 2022-09-11 MED ORDER — VENLAFAXINE HCL ER 37.5 MG PO CP24
37.5000 mg | ORAL_CAPSULE | Freq: Every day | ORAL | 0 refills | Status: DC
  Filled 2022-09-11: qty 90, 90d supply, fill #0

## 2022-09-11 NOTE — Assessment & Plan Note (Signed)
Chronic problem with mild exacerbation.  Continue the Effexor which was refilled today at the same dosing patient has been on.  Seems to be keeping the pain today.  Discussed icing regimen and home exercises, discussed which activities to do and which ones to avoid.  Increase activity slowly.  Follow-up again in 6 to 8 weeks

## 2022-09-11 NOTE — Patient Instructions (Signed)
Good to see you   

## 2022-10-11 NOTE — Progress Notes (Signed)
  [place] Phone: [phone] Subjective:   Shelby Shelby [name], am serving as a scribe for Dr. Hulan Saas.  I'm seeing this patient by the request  of:  Shelby Infante, MD  CC: Neck and back pain  [id number]  Shelby Shelby [name] is a 47 y.o. female coming in with complaint of back and neck pain. OMT 09/11/2022. Patient states that she has been playing a lot of tennis and feels good.   Medications patient has been prescribed: Effexor  Taking:         Reviewed prior external information including notes and imaging from previsou exam, outside providers and external EMR if available.   As well as notes that were available from care everywhere and other healthcare systems.  Past medical history, social, surgical and family history all reviewed in electronic medical record.  Shelby [name] information unless stated regarding to the chief complaint.   Past Medical History:  Diagnosis Date   Anxiety    Arthritis    back   GERD (gastroesophageal reflux disease)    Hypertension    controlled on meds   Palpitations    ECho and monitor 2022 normal    Shelby [name] Known Allergies   Review of Systems:  Shelby [name] headache, visual changes, nausea, vomiting, diarrhea, constipation, dizziness, abdominal pain, skin rash, fevers, chills, night sweats, weight loss, swollen lymph nodes, joint swelling, chest pain, shortness of breath, mood changes. POSITIVE muscle aches, body aches  Objective  Blood pressure 110/78, pulse 89, height '5\' 9"'$  (1.753 m), weight 187 lb (84.8 kg), SpO2 98 %.   General: Shelby [name] apparent distress alert and oriented x3 mood and affect normal, dressed appropriately.  HEENT: Pupils equal, extraocular movements intact  Respiratory: Patient's speak in full sentences and does not appear short of breath  Cardiovascular: Shelby [name] lower extremity edema, non tender, Shelby [name] erythema  Back exam does have some loss of lordosis.  Some  tenderness to palpation in the paraspinal musculature.  Patient still has the pain seems to be in the L2-L3 area.  Osteopathic findings  C6 flexed rotated and side bent right T3 extended rotated and side bent right inhaled rib T8 extended rotated and side bent left L2 flexed rotated and side bent right Sacrum right on right       Assessment and Plan:  SI (sacroiliac) joint dysfunction Chronic problem noted.  Patient is doing relatively well.  He did have some mild increase in tightness noted at the right neck today.  Patient did recently play tennis.  Likely does contribute to some of the asymmetry.  Increase activity slowly.  Follow-up again in 6 weeks Zanaflex 2 patient has for breakthrough pain.    Nonallopathic problems  Decision today to treat with OMT was based on Physical Exam  After verbal consent patient was treated with HVLA, ME, FPR techniques in cervical, rib, thoracic, lumbar, and sacral  areas  Patient tolerated the procedure well with improvement in symptoms  Patient given exercises, stretches and lifestyle modifications  See medications in patient instructions if given  Patient will follow up in 4-8 weeks     The above documentation has been reviewed and is accurate and complete Shelby Pulley, DO         Note: This dictation was prepared with Dragon dictation along with smaller phrase technology. Any transcriptional errors that result from this process are unintentional.

## 2022-10-18 ENCOUNTER — Ambulatory Visit: Payer: Commercial Managed Care - PPO | Admitting: Family Medicine

## 2022-10-18 VITALS — BP 110/78 | HR 89 | Ht 69.0 in | Wt 187.0 lb

## 2022-10-18 DIAGNOSIS — M9908 Segmental and somatic dysfunction of rib cage: Secondary | ICD-10-CM | POA: Diagnosis not present

## 2022-10-18 DIAGNOSIS — M533 Sacrococcygeal disorders, not elsewhere classified: Secondary | ICD-10-CM

## 2022-10-18 DIAGNOSIS — M9903 Segmental and somatic dysfunction of lumbar region: Secondary | ICD-10-CM

## 2022-10-18 DIAGNOSIS — M9904 Segmental and somatic dysfunction of sacral region: Secondary | ICD-10-CM

## 2022-10-18 DIAGNOSIS — M9901 Segmental and somatic dysfunction of cervical region: Secondary | ICD-10-CM | POA: Diagnosis not present

## 2022-10-18 DIAGNOSIS — M9902 Segmental and somatic dysfunction of thoracic region: Secondary | ICD-10-CM | POA: Diagnosis not present

## 2022-10-18 NOTE — Assessment & Plan Note (Signed)
Chronic problem noted.  Patient is doing relatively well.  He did have some mild increase in tightness noted at the right neck today.  Patient did recently play tennis.  Likely does contribute to some of the asymmetry.  Increase activity slowly.  Follow-up again in 6 weeks Zanaflex 2 patient has for breakthrough pain.

## 2022-10-18 NOTE — Patient Instructions (Signed)
Good to see you!  [place] total support  See me again in 6-8 weeks.

## 2022-10-30 ENCOUNTER — Other Ambulatory Visit (HOSPITAL_COMMUNITY): Payer: Self-pay

## 2022-10-30 ENCOUNTER — Other Ambulatory Visit: Payer: Self-pay | Admitting: Family Medicine

## 2022-10-30 MED ORDER — TIZANIDINE HCL 4 MG PO TABS
4.0000 mg | ORAL_TABLET | Freq: Every evening | ORAL | 1 refills | Status: DC
  Filled 2022-10-30: qty 90, 90d supply, fill #0
  Filled 2023-02-03: qty 90, 90d supply, fill #1

## 2022-11-10 ENCOUNTER — Other Ambulatory Visit: Payer: Self-pay | Admitting: Family Medicine

## 2022-11-11 MED ORDER — CELECOXIB 100 MG PO CAPS
100.0000 mg | ORAL_CAPSULE | Freq: Two times a day (BID) | ORAL | 3 refills | Status: DC
  Filled 2022-11-11: qty 180, 90d supply, fill #0
  Filled 2023-03-18: qty 180, 90d supply, fill #1
  Filled 2023-03-19: qty 120, 60d supply, fill #1
  Filled 2023-05-17: qty 120, 60d supply, fill #2
  Filled 2023-08-20: qty 120, 60d supply, fill #3
  Filled 2023-10-14: qty 120, 60d supply, fill #4

## 2022-11-12 ENCOUNTER — Other Ambulatory Visit (HOSPITAL_COMMUNITY): Payer: Self-pay

## 2022-11-24 ENCOUNTER — Encounter: Payer: Self-pay | Admitting: Family Medicine

## 2022-11-27 ENCOUNTER — Other Ambulatory Visit: Payer: Self-pay

## 2022-11-27 DIAGNOSIS — M5416 Radiculopathy, lumbar region: Secondary | ICD-10-CM

## 2022-11-28 NOTE — Progress Notes (Unsigned)
  [place] Phone: [phone] Subjective:   [name], am serving as a scribe for Dr. Hulan Saas.  I'm seeing this patient by the request  of:  Crist Infante, MD  CC: Back and neck pain follow-up  [id number]  Shelby [name] is a 47 y.o. female coming in with complaint of back and neck pain. OMT on 10/18/2022. Patient states that she is the same as last visit.  Continues to have some tightness.  Thinks that she may need another epidural.  Medications patient has been prescribed: Celebrex Zanaflex  Taking:         Reviewed prior external information including notes and imaging from previsou exam, outside providers and external EMR if available.   As well as notes that were available from care everywhere and other healthcare systems.  Past medical history, social, surgical and family history all reviewed in electronic medical record.  No pertanent information unless stated regarding to the chief complaint.   Past Medical History:  Diagnosis Date   Anxiety    Arthritis    back   GERD (gastroesophageal reflux disease)    Hypertension    controlled on meds   Palpitations    ECho and monitor 2022 normal    No Known Allergies   Review of Systems:  No headache, visual changes, nausea, vomiting, diarrhea, constipation, dizziness, abdominal pain, skin rash, fevers, chills, night sweats, weight loss, swollen lymph nodes, body aches, joint swelling, chest pain, shortness of breath, mood changes. POSITIVE muscle aches  Objective  Blood pressure 108/78, pulse 82, height 5' 9"$  (1.753 m), weight 187 lb (84.8 kg), SpO2 99 %.   General: No apparent distress alert and oriented x3 mood and affect normal, dressed appropriately.  HEENT: Pupils equal, extraocular movements intact  Respiratory: Patient's speak in full sentences and does not appear short of breath  Cardiovascular: No lower extremity edema,  non tender, no erythema  Low back does have some loss of lordosis.  Pelvic shear noted as well.  Some tenderness to palpation of the paraspinal musculature noted.  Osteopathic findings  C3 flexed rotated and side bent right T3 extended rotated and side bent right inhaled rib T8 extended rotated and side bent left L2 flexed rotated and side bent right Sacrum right on right       Assessment and Plan:  SI (sacroiliac) joint dysfunction Tightness noted, patient has had some increasing discomfort and pain as well.  Patient is going to be scheduled for another epidural in the near future.  Discussed ergonomics.  Increase activity slowly.  Continue the same medications.  Follow-up again in 6 to 8 weeks    Nonallopathic problems  Decision today to treat with OMT was based on Physical Exam  After verbal consent patient was treated with HVLA, ME, FPR techniques in cervical, rib, thoracic, lumbar, and sacral  areas  Patient tolerated the procedure well with improvement in symptoms  Patient given exercises, stretches and lifestyle modifications  See medications in patient instructions if given  Patient will follow up in 4-8 weeks    The above documentation has been reviewed and is accurate and complete Lyndal Pulley, DO          Note: This dictation was prepared with Dragon dictation along with smaller phrase technology. Any transcriptional errors that result from this process are unintentional.

## 2022-11-29 ENCOUNTER — Ambulatory Visit: Payer: Commercial Managed Care - PPO | Admitting: Family Medicine

## 2022-11-29 ENCOUNTER — Encounter: Payer: Self-pay | Admitting: Family Medicine

## 2022-11-29 VITALS — BP 108/78 | HR 82 | Ht 69.0 in | Wt 187.0 lb

## 2022-11-29 DIAGNOSIS — M9908 Segmental and somatic dysfunction of rib cage: Secondary | ICD-10-CM

## 2022-11-29 DIAGNOSIS — M533 Sacrococcygeal disorders, not elsewhere classified: Secondary | ICD-10-CM

## 2022-11-29 DIAGNOSIS — M9904 Segmental and somatic dysfunction of sacral region: Secondary | ICD-10-CM | POA: Diagnosis not present

## 2022-11-29 DIAGNOSIS — E538 Deficiency of other specified B group vitamins: Secondary | ICD-10-CM | POA: Diagnosis not present

## 2022-11-29 DIAGNOSIS — M9901 Segmental and somatic dysfunction of cervical region: Secondary | ICD-10-CM | POA: Diagnosis not present

## 2022-11-29 DIAGNOSIS — M9903 Segmental and somatic dysfunction of lumbar region: Secondary | ICD-10-CM

## 2022-11-29 DIAGNOSIS — M9902 Segmental and somatic dysfunction of thoracic region: Secondary | ICD-10-CM

## 2022-11-29 MED ORDER — CYANOCOBALAMIN 1000 MCG/ML IJ SOLN
1000.0000 ug | Freq: Once | INTRAMUSCULAR | Status: AC
  Administered 2022-11-29: 1000 ug via INTRAMUSCULAR

## 2022-11-29 NOTE — Assessment & Plan Note (Signed)
Tightness noted, patient has had some increasing discomfort and pain as well.  Patient is going to be scheduled for another epidural in the near future.  Discussed ergonomics.  Increase activity slowly.  Continue the same medications.  Follow-up again in 6 to 8 weeks

## 2022-11-29 NOTE — Patient Instructions (Addendum)
Good to see you Good luck with putting Get injection Fixed so now all of your lines are straight See me again in 6-8 weeks

## 2022-11-29 NOTE — Assessment & Plan Note (Signed)
B12 injection given today. 

## 2022-12-14 ENCOUNTER — Ambulatory Visit
Admission: RE | Admit: 2022-12-14 | Discharge: 2022-12-14 | Disposition: A | Discharge status: Home / Self Care | Payer: Commercial Managed Care - PPO | Source: Ambulatory Visit | Attending: Family Medicine | Admitting: Family Medicine

## 2022-12-14 DIAGNOSIS — M5416 Radiculopathy, lumbar region: Secondary | ICD-10-CM

## 2022-12-14 MED ORDER — IOPAMIDOL (ISOVUE-M 200) INJECTION 41%
1.0000 mL | Freq: Once | INTRAMUSCULAR | Status: AC
  Administered 2022-12-14: 1 mL via EPIDURAL

## 2022-12-14 MED ORDER — METHYLPREDNISOLONE ACETATE 40 MG/ML INJ SUSP (RADIOLOG
80.0000 mg | Freq: Once | INTRAMUSCULAR | Status: AC
  Administered 2022-12-14: 80 mg via EPIDURAL

## 2022-12-14 NOTE — Discharge Instructions (Signed)

## 2022-12-24 ENCOUNTER — Ambulatory Visit: Payer: Commercial Managed Care - PPO | Admitting: Family Medicine

## 2022-12-24 ENCOUNTER — Encounter: Payer: Self-pay | Admitting: Family Medicine

## 2022-12-24 VITALS — BP 110/82 | HR 64 | Ht 69.0 in | Wt 190.0 lb

## 2022-12-24 DIAGNOSIS — M9904 Segmental and somatic dysfunction of sacral region: Secondary | ICD-10-CM

## 2022-12-24 DIAGNOSIS — M9903 Segmental and somatic dysfunction of lumbar region: Secondary | ICD-10-CM | POA: Diagnosis not present

## 2022-12-24 DIAGNOSIS — M9902 Segmental and somatic dysfunction of thoracic region: Secondary | ICD-10-CM | POA: Diagnosis not present

## 2022-12-24 DIAGNOSIS — M9901 Segmental and somatic dysfunction of cervical region: Secondary | ICD-10-CM

## 2022-12-24 DIAGNOSIS — M533 Sacrococcygeal disorders, not elsewhere classified: Secondary | ICD-10-CM | POA: Diagnosis not present

## 2022-12-24 DIAGNOSIS — M9908 Segmental and somatic dysfunction of rib cage: Secondary | ICD-10-CM

## 2022-12-24 NOTE — Assessment & Plan Note (Signed)
Chronic problem which should do relatively well overall.  Patient will be traveling.  Has medications for any breakthrough pain including muscle relaxers and gabapentin.  Continue the Effexor at this moment.  Worsening pain can consider another epidural but patient does have 1 and is doing very well.  Discussed core strengthening and follow-up with me again in 6 to 8 weeks

## 2022-12-24 NOTE — Progress Notes (Signed)
[name] Phone: [phone] Subjective:   Shelby Shelby [name], am serving as a scribe for Dr. Hulan Saas.  I'm seeing this patient by the request  of:  Shelby Infante, MD  CC: Low back pain follow-up  [id number]  Shelby Shelby [name] is a 47 y.o. female coming in with complaint of back and neck pain. Patient states that her R hip is rotated and lower back tightness. Going to [place] in a few days.   Also having R hand pain over the 2nd metacarpal bone that radiates into wrist when holding tennis racquet and pilates bar.   Medications patient has been prescribed:   Taking:         Reviewed prior external information including notes and imaging from previsou exam, outside providers and external EMR if available.   As well as notes that were available from care everywhere and other healthcare systems.  Past medical history, social, surgical and family history all reviewed in electronic medical record.  Shelby [name] information unless stated regarding to the chief complaint.   Past Medical History:  Diagnosis Date   Anxiety    Arthritis    back   GERD (gastroesophageal reflux disease)    Hypertension    controlled on meds   Palpitations    ECho and monitor 2022 normal    Shelby [name] Known Allergies   Review of Systems:  Shelby [name] headache, visual changes, nausea, vomiting, diarrhea, constipation, dizziness, abdominal pain, skin rash, fevers, chills, night sweats, weight loss, swollen lymph nodes, body aches, joint swelling, chest pain, shortness of breath, mood changes. POSITIVE muscle aches  Objective  Blood pressure 110/82, pulse 64, height 5\' 9"  (1.753 m), weight 190 lb (86.2 kg), SpO2 98 %.   General: Shelby [name] apparent distress alert and oriented x3 mood and affect normal, dressed appropriately.  HEENT: Pupils equal, extraocular movements intact  Respiratory: Patient's speak in full sentences and does not appear short  of breath  Cardiovascular: Shelby [name] lower extremity edema, non tender, Shelby [name] erythema  Low back exam does have some mild loss lordosis.  Tightness around the right sacroiliac joint.  Still some mild tightness in the piriformis but not as much as usual.  Patient does have some mild tenderness in the thoracic spine as well  Osteopathic findings  C6 flexed rotated and side bent left T3 extended rotated and side bent right inhaled rib L2 flexed rotated and side bent right L4 flexed rotated and side bent left Sacrum right on right       Assessment and Plan:  SI (sacroiliac) joint dysfunction Chronic problem which should do relatively well overall.  Patient will be traveling.  Has medications for any breakthrough pain including muscle relaxers and gabapentin.  Continue the Effexor at this moment.  Worsening pain can consider another epidural but patient does have 1 and is doing very well.  Discussed core strengthening and follow-up with me again in 6 to 8 weeks    Nonallopathic problems  Decision today to treat with OMT was based on Physical Exam  After verbal consent patient was treated with HVLA, ME, FPR techniques in cervical, rib, thoracic, lumbar, and sacral  areas  Patient tolerated the procedure well with improvement in symptoms  Patient given exercises, stretches and lifestyle modifications  See medications in patient instructions if given  Patient will follow up in 4-8 weeks    The above documentation has been reviewed and is accurate and complete Olevia Bowens  Tamala Julian, DO          Note: This dictation was prepared with Dragon dictation along with smaller phrase technology. Any transcriptional errors that result from this process are unintentional.

## 2023-01-01 NOTE — Progress Notes (Deleted)
  [place] Phone: [phone] Subjective:    I'm seeing this patient by the request  of:  Crist Infante, MD  CC: back and neck pain   [id number]  Shelby [name] is a 47 y.o. female coming in with complaint of back and neck pain. OMT on 12/24/2022. Patient states   Medications patient has been prescribed:   Taking:         Reviewed prior external information including notes and imaging from previsou exam, outside providers and external EMR if available.   As well as notes that were available from care everywhere and other healthcare systems.  Past medical history, social, surgical and family history all reviewed in electronic medical record.  No pertanent information unless stated regarding to the chief complaint.   Past Medical History:  Diagnosis Date   Anxiety    Arthritis    back   GERD (gastroesophageal reflux disease)    Hypertension    controlled on meds   Palpitations    ECho and monitor 2022 normal    No Known Allergies   Review of Systems:  No headache, visual changes, nausea, vomiting, diarrhea, constipation, dizziness, abdominal pain, skin rash, fevers, chills, night sweats, weight loss, swollen lymph nodes, body aches, joint swelling, chest pain, shortness of breath, mood changes. POSITIVE muscle aches  Objective  There were no vitals taken for this visit.   General: No apparent distress alert and oriented x3 mood and affect normal, dressed appropriately.  HEENT: Pupils equal, extraocular movements intact  Respiratory: Patient's speak in full sentences and does not appear short of breath  Cardiovascular: No lower extremity edema, non tender, no erythema  Gait MSK:  Back exam shows   Osteopathic findings  C2 flexed rotated and side bent right C6 flexed rotated and side bent left T3 extended rotated and side bent right inhaled rib T9 extended rotated and side bent left L2  flexed rotated and side bent right Sacrum right on right     Assessment and Plan:  No problem-specific Assessment & Plan notes found for this encounter.    Nonallopathic problems  Decision today to treat with OMT was based on Physical Exam  After verbal consent patient was treated with HVLA, ME, FPR techniques in cervical, rib, thoracic, lumbar, and sacral  areas  Patient tolerated the procedure well with improvement in symptoms  Patient given exercises, stretches and lifestyle modifications  See medications in patient instructions if given  Patient will follow up in 4-8 weeks    The above documentation has been reviewed and is accurate and complete Lyndal Pulley, DO          Note: This dictation was prepared with Dragon dictation along with smaller phrase technology. Any transcriptional errors that result from this process are unintentional.

## 2023-01-09 ENCOUNTER — Ambulatory Visit: Payer: Commercial Managed Care - PPO | Admitting: Family Medicine

## 2023-01-21 DIAGNOSIS — D2271 Melanocytic nevi of right lower limb, including hip: Secondary | ICD-10-CM | POA: Diagnosis not present

## 2023-01-21 DIAGNOSIS — D225 Melanocytic nevi of trunk: Secondary | ICD-10-CM | POA: Diagnosis not present

## 2023-01-21 DIAGNOSIS — D1801 Hemangioma of skin and subcutaneous tissue: Secondary | ICD-10-CM | POA: Diagnosis not present

## 2023-01-21 DIAGNOSIS — D2261 Melanocytic nevi of right upper limb, including shoulder: Secondary | ICD-10-CM | POA: Diagnosis not present

## 2023-01-21 DIAGNOSIS — L814 Other melanin hyperpigmentation: Secondary | ICD-10-CM | POA: Diagnosis not present

## 2023-01-21 DIAGNOSIS — D2262 Melanocytic nevi of left upper limb, including shoulder: Secondary | ICD-10-CM | POA: Diagnosis not present

## 2023-01-21 DIAGNOSIS — D224 Melanocytic nevi of scalp and neck: Secondary | ICD-10-CM | POA: Diagnosis not present

## 2023-01-21 DIAGNOSIS — L570 Actinic keratosis: Secondary | ICD-10-CM | POA: Diagnosis not present

## 2023-01-21 DIAGNOSIS — L2089 Other atopic dermatitis: Secondary | ICD-10-CM | POA: Diagnosis not present

## 2023-01-31 NOTE — Progress Notes (Signed)
[name] [address] [place] [address] Phone: [phone] Subjective:   Shelby [name], am serving as a scribe for Dr. Antoine Primas.  I'm seeing this patient by the request  of:  Rodrigo Ran, MD  CC: Back and neck pain follow-up  [id number]  Shelby [name] is a 47 y.o. female coming in with complaint of back and neck pain. OMT on 12/24/2022. Patient states continues to have some tightness.  Did miss an exam.  Likely can still be active.  Complaining tenderness on a regular basis.  Medications patient has been prescribed:   Taking:         Reviewed prior external information including notes and imaging from previsou exam, outside providers and external EMR if available.   As well as notes that were available from care everywhere and other healthcare systems.  Past medical history, social, surgical and family history all reviewed in electronic medical record.  No pertanent information unless stated regarding to the chief complaint.   Past Medical History:  Diagnosis Date   Anxiety    Arthritis    back   GERD (gastroesophageal reflux disease)    Hypertension    controlled on meds   Palpitations    ECho and monitor 2022 normal    No Known Allergies   Review of Systems:  No headache, visual changes, nausea, vomiting, diarrhea, constipation, dizziness, abdominal pain, skin rash, fevers, chills, night sweats, weight loss, swollen lymph nodes, body aches, joint swelling, chest pain, shortness of breath, mood changes. POSITIVE muscle aches  Objective  Blood pressure 116/78, pulse 67, height 5\' 9"  (1.753 m), weight 192 lb (87.1 kg), SpO2 98 %.   General: No apparent distress alert and oriented x3 mood and affect normal, dressed appropriately.  HEENT: Pupils equal, extraocular movements intact  Respiratory: Patient's speak in full sentences and does not appear short of breath  Cardiovascular: No lower extremity edema, non  tender, no erythema  Tightness noted in the right hip flexor noted.  Right sacroiliac joint also has significant tenderness.  No gluteal tenderness noted.  Patient does have some decrease in internal and external range of motion of the right hip.  Osteopathic findings  C2 flexed rotated and side bent right C6 flexed rotated and side bent left T3 extended rotated and side bent right inhaled rib T9 extended rotated and side bent left L2 flexed rotated and side bent right Sacrum right on right     Assessment and Plan:  SI (sacroiliac) joint dysfunction Sacroiliac joint dysfunction.  Discussed posture and ergonomics, discussed hip abductor strengthening.  Tightness still noted on the muscle in the lumbar spine.  Finding well though to osteopathic manipulation.  Still on the Effexor.  Muscle relaxers at night.  Follow-up again in 6 to 8 weeks    Nonallopathic problems  Decision today to treat with OMT was based on Physical Exam  After verbal consent patient was treated with HVLA, ME, FPR techniques in cervical, rib, thoracic, lumbar, and sacral  areas  Patient tolerated the procedure well with improvement in symptoms  Patient given exercises, stretches and lifestyle modifications  See medications in patient instructions if given  Patient will follow up in 4-8 weeks     The above documentation has been reviewed and is accurate and complete Judi Saa, DO         Note: This dictation was prepared with Dragon dictation along with smaller phrase technology. Any transcriptional errors that result  from this process are unintentional.

## 2023-02-04 ENCOUNTER — Other Ambulatory Visit (HOSPITAL_COMMUNITY): Payer: Self-pay

## 2023-02-05 ENCOUNTER — Encounter: Payer: Self-pay | Admitting: Family Medicine

## 2023-02-05 ENCOUNTER — Ambulatory Visit: Payer: Commercial Managed Care - PPO | Admitting: Family Medicine

## 2023-02-05 VITALS — BP 116/78 | HR 67 | Ht 69.0 in | Wt 192.0 lb

## 2023-02-05 DIAGNOSIS — M9901 Segmental and somatic dysfunction of cervical region: Secondary | ICD-10-CM

## 2023-02-05 DIAGNOSIS — M9908 Segmental and somatic dysfunction of rib cage: Secondary | ICD-10-CM

## 2023-02-05 DIAGNOSIS — M533 Sacrococcygeal disorders, not elsewhere classified: Secondary | ICD-10-CM | POA: Diagnosis not present

## 2023-02-05 DIAGNOSIS — M9903 Segmental and somatic dysfunction of lumbar region: Secondary | ICD-10-CM | POA: Diagnosis not present

## 2023-02-05 DIAGNOSIS — M9904 Segmental and somatic dysfunction of sacral region: Secondary | ICD-10-CM | POA: Diagnosis not present

## 2023-02-05 DIAGNOSIS — M9902 Segmental and somatic dysfunction of thoracic region: Secondary | ICD-10-CM | POA: Diagnosis not present

## 2023-02-05 NOTE — Assessment & Plan Note (Signed)
Sacroiliac joint dysfunction.  Discussed posture and ergonomics, discussed hip abductor strengthening.  Tightness still noted on the muscle in the lumbar spine.  Finding well though to osteopathic manipulation.  Still on the Effexor.  Muscle relaxers at night.  Follow-up again in 6 to 8 weeks

## 2023-02-25 ENCOUNTER — Other Ambulatory Visit: Payer: Self-pay | Admitting: Family Medicine

## 2023-02-25 ENCOUNTER — Other Ambulatory Visit (HOSPITAL_COMMUNITY): Payer: Self-pay

## 2023-02-25 MED ORDER — VENLAFAXINE HCL ER 37.5 MG PO CP24
37.5000 mg | ORAL_CAPSULE | Freq: Every day | ORAL | 0 refills | Status: DC
  Filled 2023-02-25: qty 90, 90d supply, fill #0

## 2023-03-06 ENCOUNTER — Other Ambulatory Visit (HOSPITAL_COMMUNITY): Payer: Self-pay

## 2023-03-06 MED ORDER — OPZELURA 1.5 % EX CREA
1.0000 | TOPICAL_CREAM | Freq: Two times a day (BID) | CUTANEOUS | 2 refills | Status: AC
  Filled 2023-03-06: qty 60, 30d supply, fill #0
  Filled 2023-05-17: qty 60, 30d supply, fill #1

## 2023-03-18 ENCOUNTER — Other Ambulatory Visit: Payer: Self-pay

## 2023-03-18 ENCOUNTER — Other Ambulatory Visit (HOSPITAL_COMMUNITY): Payer: Self-pay

## 2023-03-18 DIAGNOSIS — H04122 Dry eye syndrome of left lacrimal gland: Secondary | ICD-10-CM | POA: Diagnosis not present

## 2023-03-18 DIAGNOSIS — H10412 Chronic giant papillary conjunctivitis, left eye: Secondary | ICD-10-CM | POA: Diagnosis not present

## 2023-03-18 MED ORDER — PREDNISOLONE ACETATE 1 % OP SUSP
1.0000 [drp] | Freq: Two times a day (BID) | OPHTHALMIC | 0 refills | Status: DC
  Filled 2023-03-18: qty 5, 50d supply, fill #0

## 2023-03-19 ENCOUNTER — Other Ambulatory Visit (HOSPITAL_COMMUNITY): Payer: Self-pay

## 2023-03-19 ENCOUNTER — Other Ambulatory Visit: Payer: Self-pay

## 2023-03-19 NOTE — Progress Notes (Unsigned)
  [name] [address] [place] [address] Phone: [phone] Subjective:   Shelby [name], am serving as a scribe for Dr. Antoine Primas.  I'm seeing this patient by the request  of:  Rodrigo Ran, MD  CC: Back and neck pain follow-up  [name]  Shelby [name] is a 47 y.o. female coming in with complaint of back and neck pain. OMT on 02/05/2023. Patient states same per usual. No new concerns.  Medications patient has been prescribed: Effexor  Taking:         Reviewed prior external information including notes and imaging from previsou exam, outside providers and external EMR if available.   As well as notes that were available from care everywhere and other healthcare systems.  Past medical history, social, surgical and family history all reviewed in electronic medical record.  No pertanent information unless stated regarding to the chief complaint.   Past Medical History:  Diagnosis Date   Anxiety    Arthritis    back   GERD (gastroesophageal reflux disease)    Hypertension    controlled on meds   Palpitations    ECho and monitor 2022 normal    No Known Allergies   Review of Systems:  No headache, visual changes, nausea, vomiting, diarrhea, constipation, dizziness, abdominal pain, skin rash, fevers, chills, night sweats, weight loss, swollen lymph nodes, body aches, joint swelling, chest pain, shortness of breath, mood changes. POSITIVE muscle aches  Objective  Blood pressure 118/88, pulse 74, height 5\' 9"  (1.753 m), weight 191 lb (86.6 kg), SpO2 98 %.   General: No apparent distress alert and oriented x3 mood and affect normal, dressed appropriately.  HEENT: Pupils equal, extraocular movements intact  Respiratory: Patient's speak in full sentences and does not appear short of breath  Cardiovascular: No lower extremity edema, non tender, no erythema  Back exam does have some loss lordosis noted.  Some tenderness to  palpation in the Trulicity of L3 on L4 4.  Patient does have tightness noted.  Osteopathic findings  C7 flexed rotated and side bent left T3 extended rotated and side bent right inhaled rib T7 extended rotated and side bent left L3 flexed rotated and side bent right Sacrum right on right       Assessment and Plan:  Acute lumbar radiculopathy Tightness noted again.  Continues to have pain with any type of extension of the back especially at the L3 area.  Patient is to increase activity very slowly.  Patient has been able to be very active but does have unfortunately worsening pain with extension again.  Can consider epidurals when needed.  Increase activity slowly otherwise.  Follow-up again in 6 to 8 weeks.    Nonallopathic problems  Decision today to treat with OMT was based on Physical Exam  After verbal consent patient was treated with HVLA, ME, FPR techniques in cervical, rib, thoracic, lumbar, and sacral  areas  Patient tolerated the procedure well with improvement in symptoms  Patient given exercises, stretches and lifestyle modifications  See medications in patient instructions if given  Patient will follow up in 4-8 weeks     The above documentation has been reviewed and is accurate and complete Shelby Saa, DO         Note: This dictation was prepared with Dragon dictation along with smaller phrase technology. Any transcriptional errors that result from this process are unintentional.

## 2023-03-20 ENCOUNTER — Ambulatory Visit: Payer: Commercial Managed Care - PPO | Admitting: Family Medicine

## 2023-03-20 ENCOUNTER — Encounter: Payer: Self-pay | Admitting: Family Medicine

## 2023-03-20 VITALS — BP 118/88 | HR 74 | Ht 69.0 in | Wt 191.0 lb

## 2023-03-20 DIAGNOSIS — M9908 Segmental and somatic dysfunction of rib cage: Secondary | ICD-10-CM | POA: Diagnosis not present

## 2023-03-20 DIAGNOSIS — M9901 Segmental and somatic dysfunction of cervical region: Secondary | ICD-10-CM

## 2023-03-20 DIAGNOSIS — M5416 Radiculopathy, lumbar region: Secondary | ICD-10-CM

## 2023-03-20 DIAGNOSIS — M9904 Segmental and somatic dysfunction of sacral region: Secondary | ICD-10-CM | POA: Diagnosis not present

## 2023-03-20 DIAGNOSIS — M9903 Segmental and somatic dysfunction of lumbar region: Secondary | ICD-10-CM

## 2023-03-20 DIAGNOSIS — M9902 Segmental and somatic dysfunction of thoracic region: Secondary | ICD-10-CM | POA: Diagnosis not present

## 2023-03-20 NOTE — Assessment & Plan Note (Signed)
Tightness noted again.  Continues to have pain with any type of extension of the back especially at the L3 area.  Patient is to increase activity very slowly.  Patient has been able to be very active but does have unfortunately worsening pain with extension again.  Can consider epidurals when needed.  Increase activity slowly otherwise.  Follow-up again in 6 to 8 weeks.

## 2023-03-20 NOTE — Patient Instructions (Signed)
Good to see you! See you again in 6-7 weeks 

## 2023-04-04 ENCOUNTER — Telehealth: Payer: Self-pay | Admitting: Internal Medicine

## 2023-04-04 NOTE — Telephone Encounter (Signed)
New patient appt was made for 10/14/2023

## 2023-04-04 NOTE — Telephone Encounter (Signed)
This patient would like to be accepted as a new patient of Dr. Lawerance Bach - Ayesha Mohair told patient it was ok.  Please advise.

## 2023-04-04 NOTE — Telephone Encounter (Signed)
Yes I will accept.  Please schedule for an 11 am slot if possible

## 2023-04-15 DIAGNOSIS — H52202 Unspecified astigmatism, left eye: Secondary | ICD-10-CM | POA: Diagnosis not present

## 2023-04-19 NOTE — Progress Notes (Signed)
[name] [address] [place] [address] Phone: [phone] Subjective:   Shelby [name], am serving as a scribe for Dr. Antoine Primas.  I'm seeing this patient by the request  of:  Rodrigo Ran, MD  CC: Back and neck pain follow-up  [id number]  KATARYNA MCQUILKIN is a 47 y.o. female coming in with complaint of back and neck pain. OMT on 03/20/2023. Patient states same per usual. NO new cocnerns.  Has had some stiffness in noted.  Still cannot increase activity significantly.  Patient is able to do normal work out.  May be other than that there is unable to do anything more.  Denies any radiation of pain but can have tightness down the leg.    medications patient has been prescribed: Effexor  Taking: Yes         Reviewed prior external information including notes and imaging from previsou exam, outside providers and external EMR if available.   As well as notes that were available from care everywhere and other healthcare systems.  Past medical history, social, surgical and family history all reviewed in electronic medical record.  No pertanent information unless stated regarding to the chief complaint.   Past Medical History:  Diagnosis Date   Anxiety    Arthritis    back   GERD (gastroesophageal reflux disease)    Hypertension    controlled on meds   Palpitations    ECho and monitor 2022 normal    No Known Allergies   Review of Systems:  No headache, visual changes, nausea, vomiting, diarrhea, constipation, dizziness, abdominal pain, skin rash, fevers, chills, night sweats, weight loss, swollen lymph nodes, body aches, joint swelling, chest pain, shortness of breath, mood changes. POSITIVE muscle aches  Objective  Blood pressure 112/84, pulse 60, height 5\' 9"  (1.753 m), weight 193 lb (87.5 kg), SpO2 97%.   General: No apparent distress alert and oriented x3 mood and affect normal, dressed appropriately.  HEENT: Pupils  equal, extraocular movements intact  Respiratory: Patient's speak in full sentences and does not appear short of breath  Cardiovascular: No lower extremity edema, non tender, no erythema  Low back exam does have some loss lordosis noted.  Some tenderness to palpation in the paraspinal musculature.  Osteopathic findings  C2 flexed rotated and side bent right C7 flexed rotated and side bent left T3 extended rotated and side bent right inhaled rib T8 extended rotated and side bent left L2 flexed rotated and side bent right Sacrum right on right       Assessment and Plan:  SI (sacroiliac) joint dysfunction Chronic problem does seem to be a very mild exacerbation.  Still unable to increase activity significantly.  Patient has responded well to epidural and the last one was greater than 4 months ago.  Will continue to monitor.  Responding well to manipulation.  Follow-up again in 6 to 8 weeks continue the Effexor  B12 deficiency Discussed with checking again.  Has not had another B12 injection in quite some time.    Nonallopathic problems  Decision today to treat with OMT was based on Physical Exam  After verbal consent patient was treated with HVLA, ME, FPR techniques in cervical, rib, thoracic, lumbar, and sacral  areas  Patient tolerated the procedure well with improvement in symptoms  Patient given exercises, stretches and lifestyle modifications  See medications in patient instructions if given  Patient will follow up in 4-8 weeks  The above documentation has been reviewed and is accurate and complete Judi Saa, DO        Note: This dictation was prepared with Dragon dictation along with smaller phrase technology. Any transcriptional errors that result from this process are unintentional.

## 2023-04-24 ENCOUNTER — Encounter: Payer: Self-pay | Admitting: Family Medicine

## 2023-04-24 ENCOUNTER — Ambulatory Visit: Payer: Commercial Managed Care - PPO | Admitting: Family Medicine

## 2023-04-24 VITALS — BP 112/84 | HR 60 | Ht 69.0 in | Wt 193.0 lb

## 2023-04-24 DIAGNOSIS — M9903 Segmental and somatic dysfunction of lumbar region: Secondary | ICD-10-CM

## 2023-04-24 DIAGNOSIS — M9904 Segmental and somatic dysfunction of sacral region: Secondary | ICD-10-CM

## 2023-04-24 DIAGNOSIS — E538 Deficiency of other specified B group vitamins: Secondary | ICD-10-CM | POA: Diagnosis not present

## 2023-04-24 DIAGNOSIS — M9908 Segmental and somatic dysfunction of rib cage: Secondary | ICD-10-CM

## 2023-04-24 DIAGNOSIS — M9902 Segmental and somatic dysfunction of thoracic region: Secondary | ICD-10-CM | POA: Diagnosis not present

## 2023-04-24 DIAGNOSIS — M533 Sacrococcygeal disorders, not elsewhere classified: Secondary | ICD-10-CM

## 2023-04-24 DIAGNOSIS — M9901 Segmental and somatic dysfunction of cervical region: Secondary | ICD-10-CM

## 2023-04-24 NOTE — Patient Instructions (Signed)
When you see the next doc see if they will draw Vit D, B12, TSH, T3, T4, and insulin level See you again in 6 weeks

## 2023-04-24 NOTE — Assessment & Plan Note (Signed)
Chronic problem does seem to be a very mild exacerbation.  Still unable to increase activity significantly.  Patient has responded well to epidural and the last one was greater than 4 months ago.  Will continue to monitor.  Responding well to manipulation.  Follow-up again in 6 to 8 weeks continue the Effexor

## 2023-04-24 NOTE — Assessment & Plan Note (Addendum)
Discussed with checking again.  Has not had another B12 injection in quite some time.  Discussed need to have this checked as well as other labs with her primary care provider.

## 2023-04-25 DIAGNOSIS — Z1231 Encounter for screening mammogram for malignant neoplasm of breast: Secondary | ICD-10-CM | POA: Diagnosis not present

## 2023-04-25 DIAGNOSIS — R6882 Decreased libido: Secondary | ICD-10-CM | POA: Diagnosis not present

## 2023-04-25 DIAGNOSIS — Z01419 Encounter for gynecological examination (general) (routine) without abnormal findings: Secondary | ICD-10-CM | POA: Diagnosis not present

## 2023-04-25 DIAGNOSIS — Z6828 Body mass index (BMI) 28.0-28.9, adult: Secondary | ICD-10-CM | POA: Diagnosis not present

## 2023-05-17 ENCOUNTER — Other Ambulatory Visit: Payer: Self-pay | Admitting: Family Medicine

## 2023-05-17 ENCOUNTER — Other Ambulatory Visit (HOSPITAL_COMMUNITY): Payer: Self-pay

## 2023-05-17 MED ORDER — TELMISARTAN 40 MG PO TABS
40.0000 mg | ORAL_TABLET | Freq: Every day | ORAL | 3 refills | Status: DC
  Filled 2023-05-17: qty 90, 90d supply, fill #0

## 2023-05-17 MED ORDER — TIZANIDINE HCL 4 MG PO TABS
4.0000 mg | ORAL_TABLET | Freq: Every evening | ORAL | 1 refills | Status: DC
  Filled 2023-05-17: qty 90, 90d supply, fill #0
  Filled 2023-08-20: qty 90, 90d supply, fill #1

## 2023-05-22 NOTE — Progress Notes (Signed)
  [name] [address] [place] [address] Phone: [phone] Subjective:   [name], am serving as a scribe for Dr. Antoine Primas.  I'm seeing this patient by the request  of:  Rodrigo Ran, MD  CC: Neck and back pain follow-up  [id number]  Shelby [name] is a 47 y.o. female coming in with complaint of back and neck pain. OMT on 04/24/2023. Patient states   Medications patient has been prescribed: Zanaflex  Taking: Intermittently         Reviewed prior external information including notes and imaging from previsou exam, outside providers and external EMR if available.   As well as notes that were available from care everywhere and other healthcare systems.  Past medical history, social, surgical and family history all reviewed in electronic medical record.  No pertanent information unless stated regarding to the chief complaint.   Past Medical History:  Diagnosis Date   Anxiety    Arthritis    back   GERD (gastroesophageal reflux disease)    Hypertension    controlled on meds   Palpitations    ECho and monitor 2022 normal    No Known Allergies   Review of Systems:  No headache, visual changes, nausea, vomiting, diarrhea, constipation, dizziness, abdominal pain, skin rash, fevers, chills, night sweats, weight loss, swollen lymph nodes, body aches, joint swelling, chest pain, shortness of breath, mood changes. POSITIVE muscle aches  Objective  Blood pressure 120/72, height 5\' 9"  (1.753 m), weight 189 lb (85.7 kg).   General: No apparent distress alert and oriented x3 mood and affect normal, dressed appropriately.  HEENT: Pupils equal, extraocular movements intact  Respiratory: Patient's speak in full sentences and does not appear short of breath  Cardiovascular: No lower extremity edema, non tender, no erythema  Gait MSK:  Back   Osteopathic findings  C3 flexed rotated and side bent right C7 flexed rotated  and side bent left T3 extended rotated and side bent right inhaled rib T9 extended rotated and side bent left L3 flexed rotated and side bent right Sacrum right on right       Assessment and Plan:  SI (sacroiliac) joint dysfunction Likely no radicular symptoms at this time.  Discussed with icing regimen and home exercises.  Increase activity slowly over the course of next several weeks.  Patient is working on her core strength and is doing very well.  Weight has continued to come down as well which I think is making improvement.  We did discuss the possibility of mattresses and how they could be beneficial as well.  Follow-up again in 6 to 8 weeks otherwise.    Nonallopathic problems  Decision today to treat with OMT was based on Physical Exam  After verbal consent patient was treated with HVLA, ME, FPR techniques in cervical, rib, thoracic, lumbar, and sacral  areas  Patient tolerated the procedure well with improvement in symptoms  Patient given exercises, stretches and lifestyle modifications  See medications in patient instructions if given  Patient will follow up in 4-8 weeks     The above documentation has been reviewed and is accurate and complete Judi Saa, DO         Note: This dictation was prepared with Dragon dictation along with smaller phrase technology. Any transcriptional errors that result from this process are unintentional.

## 2023-05-28 ENCOUNTER — Ambulatory Visit: Payer: Commercial Managed Care - PPO | Admitting: Family Medicine

## 2023-05-28 ENCOUNTER — Encounter: Payer: Self-pay | Admitting: Family Medicine

## 2023-05-28 VITALS — BP 120/72 | Ht 69.0 in | Wt 189.0 lb

## 2023-05-28 DIAGNOSIS — M9902 Segmental and somatic dysfunction of thoracic region: Secondary | ICD-10-CM | POA: Diagnosis not present

## 2023-05-28 DIAGNOSIS — M9908 Segmental and somatic dysfunction of rib cage: Secondary | ICD-10-CM

## 2023-05-28 DIAGNOSIS — M9901 Segmental and somatic dysfunction of cervical region: Secondary | ICD-10-CM

## 2023-05-28 DIAGNOSIS — M9904 Segmental and somatic dysfunction of sacral region: Secondary | ICD-10-CM

## 2023-05-28 DIAGNOSIS — M533 Sacrococcygeal disorders, not elsewhere classified: Secondary | ICD-10-CM

## 2023-05-28 DIAGNOSIS — M9903 Segmental and somatic dysfunction of lumbar region: Secondary | ICD-10-CM

## 2023-05-28 NOTE — Patient Instructions (Signed)
I think you need a new mattress

## 2023-05-28 NOTE — Assessment & Plan Note (Signed)
Likely no radicular symptoms at this time.  Discussed with icing regimen and home exercises.  Increase activity slowly over the course of next several weeks.  Patient is working on her core strength and is doing very well.  Weight has continued to come down as well which I think is making improvement.  We did discuss the possibility of mattresses and how they could be beneficial as well.  Follow-up again in 6 to 8 weeks otherwise.

## 2023-06-06 ENCOUNTER — Encounter: Payer: Self-pay | Admitting: Family Medicine

## 2023-06-12 ENCOUNTER — Other Ambulatory Visit: Payer: Self-pay | Admitting: Family Medicine

## 2023-06-12 DIAGNOSIS — M5416 Radiculopathy, lumbar region: Secondary | ICD-10-CM

## 2023-06-17 NOTE — Discharge Instructions (Signed)

## 2023-06-18 ENCOUNTER — Ambulatory Visit
Admission: RE | Admit: 2023-06-18 | Discharge: 2023-06-18 | Disposition: A | Discharge status: Home / Self Care | Payer: Commercial Managed Care - PPO | Source: Ambulatory Visit | Attending: Family Medicine | Admitting: Family Medicine

## 2023-06-18 DIAGNOSIS — M5416 Radiculopathy, lumbar region: Secondary | ICD-10-CM

## 2023-06-18 DIAGNOSIS — M549 Dorsalgia, unspecified: Secondary | ICD-10-CM | POA: Diagnosis not present

## 2023-06-18 MED ORDER — METHYLPREDNISOLONE ACETATE 40 MG/ML INJ SUSP (RADIOLOG
80.0000 mg | Freq: Once | INTRAMUSCULAR | Status: AC
  Administered 2023-06-18: 80 mg via EPIDURAL

## 2023-06-18 MED ORDER — IOPAMIDOL (ISOVUE-M 200) INJECTION 41%
1.0000 mL | Freq: Once | INTRAMUSCULAR | Status: AC
  Administered 2023-06-18: 1 mL via EPIDURAL

## 2023-06-27 NOTE — Progress Notes (Signed)
[name] [address] [place] [address] Phone: [phone] Subjective:   [name], am serving as a scribe for Dr. Antoine Primas.  I'm seeing this patient by the request  of:  Rodrigo Ran, MD  CC: Back and neck pain follow-up  [id number]  Shelby [name] is a 47 y.o. female coming in with complaint of back and neck pain. OMT on 05/28/2023. Patient states that had epidural and she is doing a lot better.   Medications patient has been prescribed: zanaflex  Taking:         Reviewed prior external information including notes and imaging from previsou exam, outside providers and external EMR if available.   As well as notes that were available from care everywhere and other healthcare systems.  Past medical history, social, surgical and family history all reviewed in electronic medical record.  No pertanent information unless stated regarding to the chief complaint.   Past Medical History:  Diagnosis Date   Anxiety    Arthritis    back   GERD (gastroesophageal reflux disease)    Hypertension    controlled on meds   Palpitations    ECho and monitor 2022 normal    No Known Allergies   Review of Systems:  No headache, visual changes, nausea, vomiting, diarrhea, constipation, dizziness, abdominal pain, skin rash, fevers, chills, night sweats, weight loss, swollen lymph nodes, body aches, joint swelling, chest pain, shortness of breath, mood changes. POSITIVE muscle aches  Objective  Blood pressure (!) 122/96, height 5\' 9"  (1.753 m), weight 186 lb (84.4 kg).   General: No apparent distress alert and oriented x3 mood and affect normal, dressed appropriately.  HEENT: Pupils equal, extraocular movements intact  Respiratory: Patient's speak in full sentences and does not appear short of breath  Cardiovascular: No lower extremity edema, non tender, no erythema  MSK:  Back does have some loss lordosis noted.  Patient does still  have some tightness noted in the thoracolumbar juncture in the sacroiliac joint bilaterally. Tenderness to palpation in the L2 area on the right side  Osteopathic findings  C2 flexed rotated and side bent right C6 flexed rotated and side bent left T3 extended rotated and side bent right inhaled rib T11 extended rotated and side bent left L2 flexed rotated and side bent right L3 flexed rotated and side bent with Sacrum right on right    Assessment and Plan:  SI (sacroiliac) joint dysfunction Still has some exacerbation from time to time.  Still responds well to osteopathic manipulation.  Discussed with patient to continue to work on core strengthening and posture.  Discussed icing regimen and home exercises.  Follow-up with me again in 6 to 8 weeks otherwise.  No change in medications but has the Zanaflex for breakthrough if needed.    Nonallopathic problems  Decision today to treat with OMT was based on Physical Exam  After verbal consent patient was treated with HVLA, ME, FPR techniques in cervical, rib, thoracic, lumbar, and sacral  areas  Patient tolerated the procedure well with improvement in symptoms  Patient given exercises, stretches and lifestyle modifications  See medications in patient instructions if given  Patient will follow up in 4-8 weeks     The above documentation has been reviewed and is accurate and complete Judi Saa, DO         Note: This dictation was prepared with Dragon dictation along with smaller phrase technology. Any transcriptional errors that result  from this process are unintentional.

## 2023-07-01 ENCOUNTER — Ambulatory Visit: Payer: Commercial Managed Care - PPO | Admitting: Family Medicine

## 2023-07-01 ENCOUNTER — Encounter: Payer: Self-pay | Admitting: Family Medicine

## 2023-07-01 VITALS — BP 122/96 | Ht 69.0 in | Wt 186.0 lb

## 2023-07-01 DIAGNOSIS — M533 Sacrococcygeal disorders, not elsewhere classified: Secondary | ICD-10-CM | POA: Diagnosis not present

## 2023-07-01 DIAGNOSIS — M9904 Segmental and somatic dysfunction of sacral region: Secondary | ICD-10-CM

## 2023-07-01 DIAGNOSIS — M9908 Segmental and somatic dysfunction of rib cage: Secondary | ICD-10-CM

## 2023-07-01 DIAGNOSIS — M9903 Segmental and somatic dysfunction of lumbar region: Secondary | ICD-10-CM

## 2023-07-01 DIAGNOSIS — M9902 Segmental and somatic dysfunction of thoracic region: Secondary | ICD-10-CM

## 2023-07-01 DIAGNOSIS — M9901 Segmental and somatic dysfunction of cervical region: Secondary | ICD-10-CM

## 2023-07-01 NOTE — Assessment & Plan Note (Signed)
Still has some exacerbation from time to time.  Still responds well to osteopathic manipulation.  Discussed with patient to continue to work on core strengthening and posture.  Discussed icing regimen and home exercises.  Follow-up with me again in 6 to 8 weeks otherwise.  No change in medications but has the Zanaflex for breakthrough if needed.

## 2023-07-05 ENCOUNTER — Other Ambulatory Visit (HOSPITAL_COMMUNITY): Payer: Self-pay

## 2023-07-05 DIAGNOSIS — L239 Allergic contact dermatitis, unspecified cause: Secondary | ICD-10-CM | POA: Diagnosis not present

## 2023-07-05 MED ORDER — MOMETASONE FUROATE 0.1 % EX CREA
1.0000 | TOPICAL_CREAM | Freq: Two times a day (BID) | CUTANEOUS | 0 refills | Status: DC
  Filled 2023-07-05: qty 45, 30d supply, fill #0

## 2023-07-18 DIAGNOSIS — Z6827 Body mass index (BMI) 27.0-27.9, adult: Secondary | ICD-10-CM | POA: Diagnosis not present

## 2023-07-18 DIAGNOSIS — Z713 Dietary counseling and surveillance: Secondary | ICD-10-CM | POA: Diagnosis not present

## 2023-08-14 NOTE — Progress Notes (Unsigned)
  [name] [address] [place] [address] Phone: [phone] Subjective:   [name], am serving as a scribe for Dr. Antoine Primas.  I'm seeing this patient by the request  of:  Rodrigo Ran, MD  CC: back and neck pain follow up   [id number]  Shelby [name] is a 47 y.o. female coming in with complaint of back and neck pain. OMT on 07/01/2023. Patient states same per usual. No new concerns.  Medications patient has been prescribed: Tramadol Zanaflex  Taking:         Reviewed prior external information including notes and imaging from previsou exam, outside providers and external EMR if available.   As well as notes that were available from care everywhere and other healthcare systems.  Past medical history, social, surgical and family history all reviewed in electronic medical record.  No pertanent information unless stated regarding to the chief complaint.   Past Medical History:  Diagnosis Date   Anxiety    Arthritis    back   GERD (gastroesophageal reflux disease)    Hypertension    controlled on meds   Palpitations    ECho and monitor 2022 normal    No Known Allergies   Review of Systems:  No headache, visual changes, nausea, vomiting, diarrhea, constipation, dizziness, abdominal pain, skin rash, fevers, chills, night sweats, weight loss, swollen lymph nodes, body aches, joint swelling, chest pain, shortness of breath, mood changes. POSITIVE muscle aches  Objective  Blood pressure 124/86, pulse 82, height 5\' 9"  (1.753 m), weight 183 lb (83 kg), SpO2 98%.   General: No apparent distress alert and oriented x3 mood and affect normal, dressed appropriately.  HEENT: Pupils equal, extraocular movements intact  Respiratory: Patient's speak in full sentences and does not appear short of breath  Cardiovascular: No lower extremity edema, non tender, no erythema  Back exam does have some loss lordosis noted.  Some  tenderness to palpation noted.  Tightness with Pearlean Brownie right greater than left.  Patient does have some externally rotated on the right leg.  Osteopathic findings C6 flexed rotated and side bent left T3 extended rotated and side bent right inhaled rib T7 extended rotated and side bent left L1 flexed rotated and side bent right Sacrum right on right    Assessment and Plan:  SI (sacroiliac) joint dysfunction Sacroiliac dysfunction still noted.  Still concern for some the lumbar radiculopathy as well.  Increase activity slowly.  Discussed icing regimen and home exercises.  Refilled all medications including muscle relaxer and the Effexor.  Seems to be helping and she is doing significantly well with her exercises and weight.  Follow-up with me again in 6 to 8 weeks otherwise.    Nonallopathic problems  Decision today to treat with OMT was based on Physical Exam  After verbal consent patient was treated with HVLA, ME, FPR techniques in cervical, rib, thoracic, lumbar, and sacral  areas  Patient tolerated the procedure well with improvement in symptoms  Patient given exercises, stretches and lifestyle modifications  See medications in patient instructions if given  Patient will follow up in 4-8 weeks    The above documentation has been reviewed and is accurate and complete Judi Saa, DO          Note: This dictation was prepared with Dragon dictation along with smaller phrase technology. Any transcriptional errors that result from this process are unintentional.

## 2023-08-15 ENCOUNTER — Encounter: Payer: Self-pay | Admitting: Family Medicine

## 2023-08-15 ENCOUNTER — Ambulatory Visit: Payer: Commercial Managed Care - PPO | Admitting: Family Medicine

## 2023-08-15 ENCOUNTER — Other Ambulatory Visit (HOSPITAL_COMMUNITY): Payer: Self-pay

## 2023-08-15 VITALS — BP 124/86 | HR 82 | Ht 69.0 in | Wt 183.0 lb

## 2023-08-15 DIAGNOSIS — M9904 Segmental and somatic dysfunction of sacral region: Secondary | ICD-10-CM

## 2023-08-15 DIAGNOSIS — M533 Sacrococcygeal disorders, not elsewhere classified: Secondary | ICD-10-CM

## 2023-08-15 DIAGNOSIS — M9901 Segmental and somatic dysfunction of cervical region: Secondary | ICD-10-CM | POA: Diagnosis not present

## 2023-08-15 DIAGNOSIS — M9908 Segmental and somatic dysfunction of rib cage: Secondary | ICD-10-CM | POA: Diagnosis not present

## 2023-08-15 DIAGNOSIS — M9902 Segmental and somatic dysfunction of thoracic region: Secondary | ICD-10-CM | POA: Diagnosis not present

## 2023-08-15 DIAGNOSIS — M9903 Segmental and somatic dysfunction of lumbar region: Secondary | ICD-10-CM

## 2023-08-15 MED ORDER — VENLAFAXINE HCL ER 37.5 MG PO CP24
37.5000 mg | ORAL_CAPSULE | Freq: Every day | ORAL | 0 refills | Status: DC
  Filled 2023-08-15: qty 90, 90d supply, fill #0

## 2023-08-15 NOTE — Patient Instructions (Signed)
Do prescribed exercises at least 3x a week for Shelby [name] to see you! Get people to help with Shelby [name], not Shelby [name] See you again in 6-8 weeks

## 2023-08-15 NOTE — Assessment & Plan Note (Signed)
Sacroiliac dysfunction still noted.  Still concern for some the lumbar radiculopathy as well.  Increase activity slowly.  Discussed icing regimen and home exercises.  Refilled all medications including muscle relaxer and the Effexor.  Seems to be helping and she is doing significantly well with her exercises and weight.  Follow-up with me again in 6 to 8 weeks otherwise.

## 2023-08-20 ENCOUNTER — Other Ambulatory Visit (HOSPITAL_COMMUNITY): Payer: Self-pay

## 2023-09-25 NOTE — Progress Notes (Unsigned)
[name] [address] [place] [address] Phone: [phone] Subjective:   [name], am serving as a scribe for Dr. Antoine Primas.  I'm seeing this patient by the request  of:  Rodrigo Ran, MD  CC: Back and neck pain follow-up  [id number]  Shelby [name] is a 47 y.o. female coming in with complaint of back and neck pain. OMT on 111/04/2023. Patient states same per usual. B12 shot today.  Patient is felt a little fatigued.  Has had B12 deficiency previously.  Medications patient has been prescribed: Effexor  Taking: Yes         Reviewed prior external information including notes and imaging from previsou exam, outside providers and external EMR if available.   As well as notes that were available from care everywhere and other healthcare systems.  Past medical history, social, surgical and family history all reviewed in electronic medical record.  No pertanent information unless stated regarding to the chief complaint.   Past Medical History:  Diagnosis Date   Anxiety    Arthritis    back   GERD (gastroesophageal reflux disease)    Hypertension    controlled on meds   Palpitations    ECho and monitor 2022 normal    No Known Allergies   Review of Systems:  No headache, visual changes, nausea, vomiting, diarrhea, constipation, dizziness, abdominal pain, skin rash, fevers, chills, night sweats, weight loss, swollen lymph nodes, body aches, joint swelling, chest pain, shortness of breath, mood changes. POSITIVE muscle aches  Objective  Blood pressure 114/76, pulse 65, height 5\' 9"  (1.753 m), weight 187 lb (84.8 kg), SpO2 98%.   General: No apparent distress alert and oriented x3 mood and affect normal, dressed appropriately.  HEENT: Pupils equal, extraocular movements intact  Respiratory: Patient's speak in full sentences and does not appear short of breath  Cardiovascular: No lower extremity edema, non tender, no  erythema  MSK:  Back does have tightness noted.  Tenderness to palpation in the paraspinal musculature.  Osteopathic findings  C2 flexed rotated and side bent right C6 flexed rotated and side bent left T3 extended rotated and side bent right inhaled rib T8 extended rotated and side bent left L1 flexed rotated and side bent right L3 flexed rotated and side bent left Sacrum right on right Right anterior ilium      Assessment and Plan:  SI (sacroiliac) joint dysfunction Seem to be more sacroiliac joint today.  Does have significant tightness in the piriformis bilaterally.  Unknown lumbar radiculopathy that we will need to continue to monitor as well.  Follow-up with me again in 6 to 8 weeks otherwise.  Follow-up with me again in 6 to 8 weeks Continue the Celebrex at 100 mg twice a day  Nonallopathic problems  Decision today to treat with OMT was based on Physical Exam  After verbal consent patient was treated with HVLA, ME, FPR techniques in cervical, rib, thoracic, lumbar, and sacral  areas  Patient tolerated the procedure well with improvement in symptoms  Patient given exercises, stretches and lifestyle modifications  See medications in patient instructions if given  Patient will follow up in 4-8 weeks     The above documentation has been reviewed and is accurate and complete Judi Saa, DO         Note: This dictation was prepared with Dragon dictation along with smaller phrase technology. Any transcriptional errors that result from this process are unintentional.

## 2023-09-26 ENCOUNTER — Ambulatory Visit: Payer: Commercial Managed Care - PPO | Admitting: Family Medicine

## 2023-09-26 ENCOUNTER — Encounter: Payer: Self-pay | Admitting: Family Medicine

## 2023-09-26 VITALS — BP 114/76 | HR 65 | Ht 69.0 in | Wt 187.0 lb

## 2023-09-26 DIAGNOSIS — M533 Sacrococcygeal disorders, not elsewhere classified: Secondary | ICD-10-CM

## 2023-09-26 DIAGNOSIS — M9904 Segmental and somatic dysfunction of sacral region: Secondary | ICD-10-CM | POA: Diagnosis not present

## 2023-09-26 DIAGNOSIS — M9902 Segmental and somatic dysfunction of thoracic region: Secondary | ICD-10-CM

## 2023-09-26 DIAGNOSIS — M9903 Segmental and somatic dysfunction of lumbar region: Secondary | ICD-10-CM

## 2023-09-26 DIAGNOSIS — M9908 Segmental and somatic dysfunction of rib cage: Secondary | ICD-10-CM | POA: Diagnosis not present

## 2023-09-26 DIAGNOSIS — M9901 Segmental and somatic dysfunction of cervical region: Secondary | ICD-10-CM

## 2023-09-26 DIAGNOSIS — E538 Deficiency of other specified B group vitamins: Secondary | ICD-10-CM

## 2023-09-26 MED ORDER — CYANOCOBALAMIN 1000 MCG/ML IJ SOLN
1000.0000 ug | Freq: Once | INTRAMUSCULAR | Status: AC
Start: 2023-09-26 — End: 2023-09-26
  Administered 2023-09-26: 1000 ug via INTRAMUSCULAR

## 2023-09-26 NOTE — Patient Instructions (Signed)
Happy Holidays B12 injection today See you again in 6 weeks

## 2023-09-26 NOTE — Assessment & Plan Note (Signed)
Seem to be more sacroiliac joint today.  Does have significant tightness in the piriformis bilaterally.  Unknown lumbar radiculopathy that we will need to continue to monitor as well.  Follow-up with me again in 6 to 8 weeks otherwise.  Follow-up with me again in 6 to 8 weeks

## 2023-09-26 NOTE — Assessment & Plan Note (Signed)
B12 injection given today. 

## 2023-10-10 ENCOUNTER — Other Ambulatory Visit (HOSPITAL_COMMUNITY): Payer: Self-pay

## 2023-10-13 ENCOUNTER — Encounter: Payer: Self-pay | Admitting: Internal Medicine

## 2023-10-13 NOTE — Progress Notes (Signed)
 Subjective:    Patient ID: Shelby [name], female    DOB: 1976/03/18, 48 y.o.   MRN: [id number]     HPI Shelby [name] is here for follow up of her chronic medical problems.  She is here to establish with a new pcp.    Overall doing well.  Has chronic back issues, arthritis and SI joint dysfunction.  Sees Dr. Claudene routinely.  Overall symptoms controlled.  Had some issues with tachycardia and slightly elevated diastolic BP after having COVID.  She was placed on telmisartan , but has been able to stop that and BP has been controlled.  Denies any issues with palpitations.  She did see cardiology.  All thought to be related to COVID.  Medications and allergies reviewed with patient and updated if appropriate.  Current Outpatient Medications on File Prior to Visit  Medication Sig Dispense Refill   celecoxib  (CELEBREX ) 100 MG capsule Take 1 capsule (100 mg total) by mouth 2 (two) times daily. 180 capsule 3   hydroquinone  4 % cream Apply a small amount topically to affected area once daily. 28.35 g 2   ondansetron  (ZOFRAN -ODT) 4 MG disintegrating tablet      OVER THE COUNTER MEDICATION as needed.     Ruxolitinib  Phosphate (OPZELURA ) 1.5 % CREA Apply a small amount to affected area twice a day to active eczema on face 60 g 2   tiZANidine  (ZANAFLEX ) 4 MG tablet Take 1 tablet (4 mg total) by mouth nightly 90 tablet 1   tretinoin  (RETIN-A ) 0.025 % cream Apply a pea sized amount topically to face nightly 45 g 3   venlafaxine  XR (EFFEXOR -XR) 37.5 MG 24 hr capsule Take 1 capsule (37.5 mg total) by mouth daily with breakfast. 90 capsule 0   fluorouracil  (EFUDEX ) 5 % cream Apply 1 application (a small amount) topically to skin daily for a single dose. (Patient not taking: Reported on 10/14/2023) 40 g 0   mometasone  (ELOCON ) 0.1 % cream Apply a small amount topically to affected area 2 (two) times daily. (Patient not taking: Reported on 10/14/2023) 45 g 0   prednisoLONE  acetate (PRED FORTE ) 1 % ophthalmic  suspension Place 1 drop into the left eye 2 (two) times daily for 1 month (Patient not taking: Reported on 10/14/2023) 5 mL 0   No current facility-administered medications on file prior to visit.     Review of Systems  Constitutional:  Negative for fever.  Respiratory:  Negative for cough, shortness of breath and wheezing.   Cardiovascular:  Negative for chest pain, palpitations and leg swelling.  Gastrointestinal:  Negative for abdominal pain, constipation and diarrhea.  Musculoskeletal:  Positive for back pain.  Neurological:  Negative for light-headedness and headaches.       Objective:   Vitals:   10/14/23 1130  BP: 118/72  Pulse: 78  Temp: 98.1 F (36.7 C)  SpO2: 100%   BP Readings from Last 3 Encounters:  10/14/23 118/72  09/26/23 114/76  08/15/23 124/86   Wt Readings from Last 3 Encounters:  10/14/23 191 lb (86.6 kg)  09/26/23 187 lb (84.8 kg)  08/15/23 183 lb (83 kg)   Body mass index is 28.21 kg/m.    Physical Exam Constitutional:      General: She is not in acute distress.    Appearance: Normal appearance.  HENT:     Head: Normocephalic and atraumatic.  Eyes:     Conjunctiva/sclera: Conjunctivae normal.  Cardiovascular:     Rate and Rhythm: Normal rate  and regular rhythm.     Heart sounds: Normal heart sounds.  Pulmonary:     Effort: Pulmonary effort is normal. No respiratory distress.     Breath sounds: Normal breath sounds. No wheezing.  Abdominal:     General: There is no distension.     Palpations: Abdomen is soft.     Tenderness: There is no abdominal tenderness.  Musculoskeletal:     Cervical back: Neck supple.     Right lower leg: No edema.     Left lower leg: No edema.  Lymphadenopathy:     Cervical: No cervical adenopathy.  Skin:    General: Skin is warm and dry.     Findings: No rash.  Neurological:     Mental Status: She is alert. Mental status is at baseline.  Psychiatric:        Mood and Affect: Mood normal.        Behavior:  Behavior normal.        Lab Results  Component Value Date   WBC 4.6 05/10/2016   HGB 13.6 05/10/2016   HCT 39.2 05/10/2016   PLT 217.0 05/10/2016   GLUCOSE 86 07/28/2012   CHOL 135 07/28/2012   TRIG 46.0 07/28/2012   HDL 56.60 07/28/2012   LDLCALC 69 07/28/2012   ALT 13 07/28/2012   AST 18 07/28/2012   NA 139 07/28/2012   K 4.6 07/28/2012   CL 105 07/28/2012   CREATININE 0.6 07/28/2012   BUN 11 07/28/2012   CO2 29 07/28/2012   TSH 1.08 05/10/2016     Assessment & Plan:    See Problem List for Assessment and Plan of chronic medical problems.

## 2023-10-14 ENCOUNTER — Other Ambulatory Visit: Payer: Self-pay | Admitting: Family Medicine

## 2023-10-14 ENCOUNTER — Ambulatory Visit: Payer: Commercial Managed Care - PPO | Admitting: Internal Medicine

## 2023-10-14 ENCOUNTER — Other Ambulatory Visit: Payer: Self-pay

## 2023-10-14 VITALS — BP 118/72 | HR 78 | Temp 98.1°F | Wt 191.0 lb

## 2023-10-14 DIAGNOSIS — N951 Menopausal and female climacteric states: Secondary | ICD-10-CM | POA: Diagnosis not present

## 2023-10-14 DIAGNOSIS — R000 Tachycardia, unspecified: Secondary | ICD-10-CM | POA: Diagnosis not present

## 2023-10-14 NOTE — Assessment & Plan Note (Signed)
 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

## 2023-10-14 NOTE — Assessment & Plan Note (Signed)
 Just recently had some spotting that was not true menstrual cycle and has had some symptoms at the time of her, Such as increased headaches Sees Dr Vincente Poli Likely perimenopausal

## 2023-10-14 NOTE — Patient Instructions (Addendum)
    Dr Magdalene Patricia      Medications changes include :   None

## 2023-10-15 ENCOUNTER — Other Ambulatory Visit (HOSPITAL_COMMUNITY): Payer: Self-pay

## 2023-10-15 MED ORDER — VENLAFAXINE HCL ER 37.5 MG PO CP24
37.5000 mg | ORAL_CAPSULE | Freq: Every day | ORAL | 0 refills | Status: DC
  Filled 2023-10-15 – 2023-11-25 (×2): qty 90, 90d supply, fill #0

## 2023-10-15 MED ORDER — TIZANIDINE HCL 4 MG PO TABS
4.0000 mg | ORAL_TABLET | Freq: Every evening | ORAL | 1 refills | Status: DC
  Filled 2023-10-15 – 2023-11-25 (×2): qty 90, 90d supply, fill #0
  Filled 2024-02-27: qty 90, 90d supply, fill #1

## 2023-10-17 ENCOUNTER — Other Ambulatory Visit (HOSPITAL_COMMUNITY): Payer: Self-pay

## 2023-10-24 ENCOUNTER — Other Ambulatory Visit (HOSPITAL_COMMUNITY): Payer: Self-pay

## 2023-11-06 NOTE — Progress Notes (Unsigned)
  [name] [address] [place] [address] Phone: [phone] Subjective:   Shelby [name], am serving as a scribe for Dr. Antoine Primas.  I'm seeing this patient by the request  of:  Pincus Sanes, MD  CC: Back and neck pain  [id number]  Shelby [name] is a 48 y.o. female coming in with complaint of back and neck pain. OMT on 09/26/2023. Patient states no new symptoms.  Has had tightness recently.  Medications patient has been prescribed: Effexor Zanaflex  Taking: Intermittently yes         Reviewed prior external information including notes and imaging from previsou exam, outside providers and external EMR if available.   As well as notes that were available from care everywhere and other healthcare systems.  Past medical history, social, surgical and family history all reviewed in electronic medical record.  No pertanent information unless stated regarding to the chief complaint.   Past Medical History:  Diagnosis Date   Anxiety    Arthritis    back   GERD (gastroesophageal reflux disease)    Hypertension    controlled on meds   Palpitations    ECho and monitor 2022 normal    No Known Allergies   Review of Systems:  No headache, visual changes, nausea, vomiting, diarrhea, constipation, dizziness, abdominal pain, skin rash, fevers, chills, night sweats, weight loss, swollen lymph nodes, body aches, joint swelling, chest pain, shortness of breath, mood changes. POSITIVE muscle aches  Objective  Blood pressure 120/84, pulse 78, height 5\' 9"  (1.753 m), weight 187 lb (84.8 kg), SpO2 98%.   General: No apparent distress alert and oriented x3 mood and affect normal, dressed appropriately.  HEENT: Pupils equal, extraocular movements intact  Respiratory: Patient's speak in full sentences and does not appear short of breath  Cardiovascular: No lower extremity edema, non tender, no erythema  MSK:  Back does have some loss  lordosis noted.  Some tenderness to palpation in the paraspinal musculature.  Osteopathic findings  C2 flexed rotated and side bent right C7 flexed rotated and side bent right T3 extended rotated and side bent right inhaled rib T9 extended rotated and side bent left L2 flexed rotated and side bent right L3 flexed rotated and side bent left Sacrum right on right       Assessment and Plan:  SI (sacroiliac) joint dysfunction Chronic problem.  Did have some more tightness noted.  Discussed again about different treatment options and we did discuss the possibility of hormone replacement therapy.  Discussed increasing activity slowly.  Follow-up again in 6 to 8 weeks.    Nonallopathic problems  Decision today to treat with OMT was based on Physical Exam  After verbal consent patient was treated with HVLA, ME, FPR techniques in cervical, rib, thoracic, lumbar, and sacral  areas  Patient tolerated the procedure well with improvement in symptoms  Patient given exercises, stretches and lifestyle modifications  See medications in patient instructions if given  Patient will follow up in 4-8 weeks    The above documentation has been reviewed and is accurate and complete Shelby Saa, DO          Note: This dictation was prepared with Dragon dictation along with smaller phrase technology. Any transcriptional errors that result from this process are unintentional.

## 2023-11-07 ENCOUNTER — Ambulatory Visit: Payer: Commercial Managed Care - PPO | Admitting: Family Medicine

## 2023-11-07 ENCOUNTER — Encounter: Payer: Self-pay | Admitting: Family Medicine

## 2023-11-07 VITALS — BP 120/84 | HR 78 | Ht 69.0 in | Wt 187.0 lb

## 2023-11-07 DIAGNOSIS — M9902 Segmental and somatic dysfunction of thoracic region: Secondary | ICD-10-CM | POA: Diagnosis not present

## 2023-11-07 DIAGNOSIS — M533 Sacrococcygeal disorders, not elsewhere classified: Secondary | ICD-10-CM

## 2023-11-07 DIAGNOSIS — M9903 Segmental and somatic dysfunction of lumbar region: Secondary | ICD-10-CM

## 2023-11-07 DIAGNOSIS — M9904 Segmental and somatic dysfunction of sacral region: Secondary | ICD-10-CM | POA: Diagnosis not present

## 2023-11-07 DIAGNOSIS — M9908 Segmental and somatic dysfunction of rib cage: Secondary | ICD-10-CM

## 2023-11-07 DIAGNOSIS — M9901 Segmental and somatic dysfunction of cervical region: Secondary | ICD-10-CM | POA: Diagnosis not present

## 2023-11-07 NOTE — Assessment & Plan Note (Signed)
Chronic problem.  Did have some more tightness noted.  Discussed again about different treatment options and we did discuss the possibility of hormone replacement therapy.  Discussed increasing activity slowly.  Follow-up again in 6 to 8 weeks.

## 2023-11-07 NOTE — Patient Instructions (Signed)
Good to see you! See you again in 6 weeks

## 2023-11-25 ENCOUNTER — Other Ambulatory Visit: Payer: Self-pay | Admitting: Family Medicine

## 2023-11-26 ENCOUNTER — Other Ambulatory Visit: Payer: Self-pay

## 2023-11-26 ENCOUNTER — Other Ambulatory Visit (HOSPITAL_COMMUNITY): Payer: Self-pay

## 2023-11-26 MED ORDER — CELECOXIB 100 MG PO CAPS
100.0000 mg | ORAL_CAPSULE | Freq: Two times a day (BID) | ORAL | 3 refills | Status: AC
  Filled 2023-11-26: qty 180, 90d supply, fill #0
  Filled 2024-02-27: qty 180, 90d supply, fill #1

## 2023-12-17 NOTE — Progress Notes (Unsigned)
 [name] [address] [place] [address] Phone: [phone] Subjective:   Shelby [name], am serving as a scribe for Dr. Antoine Primas.  I'm seeing this patient by the request  of:  Pincus Sanes, MD  CC: Back and neck pain follow-up  [id number]  Shelby [name] is a 48 y.o. female coming in with complaint of back and neck pain. OMT 11/07/2023. Patient states overall she has been doing relatively well.  Some mild discomfort and pain but nothing that has been stopping her.  Doing 2 workouts a day most days.  Medications patient has been prescribed: Zanaflex, Effexor  Taking: Yes         Reviewed prior external information including notes and imaging from previsou exam, outside providers and external EMR if available.   As well as notes that were available from care everywhere and other healthcare systems.  Past medical history, social, surgical and family history all reviewed in electronic medical record.  No pertanent information unless stated regarding to the chief complaint.   Past Medical History:  Diagnosis Date   Anxiety    Arthritis    back   GERD (gastroesophageal reflux disease)    Hypertension    controlled on meds   Palpitations    ECho and monitor 2022 normal    No Known Allergies   Review of Systems:  No headache, visual changes, nausea, vomiting, diarrhea, constipation, dizziness, abdominal pain, skin rash, fevers, chills, night sweats, weight loss, swollen lymph nodes, body aches, joint swelling, chest pain, shortness of breath, mood changes. POSITIVE muscle aches  Objective  Blood pressure 138/84, pulse 81, height 5\' 9"  (1.753 m), weight 183 lb (83 kg), SpO2 98%.   General: No apparent distress alert and oriented x3 mood and affect normal, dressed appropriately.  HEENT: Pupils equal, extraocular movements intact  Respiratory: Patient's speak in full sentences and does not appear short of breath   Cardiovascular: No lower extremity edema, non tender, no erythema  Gait MSK:  Back does have some loss lordosis noted.  Still some tenderness in the paraspinal musculature.  Tightness noted with certain range of motion.  Does have tightness of the legs but nothing severe.  No significant weakness noted.  Osteopathic findings  C2 flexed rotated and side bent right C5 flexed rotated and side bent left T3 extended rotated and side bent right inhaled rib T7 extended rotated and side bent left L2 flexed rotated and side bent right L4 flexed rotated and side bent left Sacrum right on right       Assessment and Plan:  Acute lumbar radiculopathy Continue to monitor.  Discussed icing regimen and home exercises, discussed which activities to do and which ones to avoid.  Increase activity slowly.  No other significant changes.  Continue with the supplementations.  Could consider the possible workup again for other things such as the B12 but I do think patient is doing well.  Follow-up again in 6 to 8 weeks    Nonallopathic problems  Decision today to treat with OMT was based on Physical Exam  After verbal consent patient was treated with HVLA, ME, FPR techniques in cervical, rib, thoracic, lumbar, and sacral  areas  Patient tolerated the procedure well with improvement in symptoms  Patient given exercises, stretches and lifestyle modifications  See medications in patient instructions if given  Patient will follow up in 4-8 weeks    The above documentation has been reviewed and is accurate  and complete Judi Saa, DO          Note: This dictation was prepared with Dragon dictation along with smaller phrase technology. Any transcriptional errors that result from this process are unintentional.

## 2023-12-18 ENCOUNTER — Ambulatory Visit: Payer: Commercial Managed Care - PPO | Admitting: Family Medicine

## 2023-12-18 ENCOUNTER — Encounter: Payer: Self-pay | Admitting: Family Medicine

## 2023-12-18 VITALS — BP 138/84 | HR 81 | Ht 69.0 in | Wt 183.0 lb

## 2023-12-18 DIAGNOSIS — M9903 Segmental and somatic dysfunction of lumbar region: Secondary | ICD-10-CM

## 2023-12-18 DIAGNOSIS — M5416 Radiculopathy, lumbar region: Secondary | ICD-10-CM

## 2023-12-18 DIAGNOSIS — M9908 Segmental and somatic dysfunction of rib cage: Secondary | ICD-10-CM | POA: Diagnosis not present

## 2023-12-18 DIAGNOSIS — M9904 Segmental and somatic dysfunction of sacral region: Secondary | ICD-10-CM

## 2023-12-18 DIAGNOSIS — M9901 Segmental and somatic dysfunction of cervical region: Secondary | ICD-10-CM | POA: Diagnosis not present

## 2023-12-18 DIAGNOSIS — M9902 Segmental and somatic dysfunction of thoracic region: Secondary | ICD-10-CM | POA: Diagnosis not present

## 2023-12-18 NOTE — Patient Instructions (Signed)
 Return in 6 to 8 weeks.

## 2023-12-18 NOTE — Assessment & Plan Note (Signed)
 Continue to monitor.  Discussed icing regimen and home exercises, discussed which activities to do and which ones to avoid.  Increase activity slowly.  No other significant changes.  Continue with the supplementations.  Could consider the possible workup again for other things such as the B12 but I do think patient is doing well.  Follow-up again in 6 to 8 weeks

## 2024-02-05 NOTE — Progress Notes (Signed)
 [name] [address] [place] [address] Phone: [phone] Subjective:   Shelby [name], am serving as a scribe for Dr. Ronnell Coins.  I'm seeing this patient by the request  of:  [name], MD  CC: Back and neck pain follow-up  PPI:[id number]  Shelby [name] is a 48 y.o. female coming in with complaint of back and neck pain. OMT on 12/18/2023. Patient states that she has been doing well since last visit. Able ot play tennis without pain.   Medications patient has been prescribed: Celebrex   Taking:         Reviewed prior external information including notes and imaging from previsou exam, outside providers and external EMR if available.   As well as notes that were available from care everywhere and other healthcare systems.  Past medical history, social, surgical and family history all reviewed in electronic medical record.  No pertanent information unless stated regarding to the chief complaint.   Past Medical History:  Diagnosis Date   Anxiety    Arthritis    back   GERD (gastroesophageal reflux disease)    Hypertension    controlled on meds   Palpitations    ECho and monitor 2022 normal    No Known Allergies   Review of Systems:  No headache, visual changes, nausea, vomiting, diarrhea, constipation, dizziness, abdominal pain, skin rash, fevers, chills, night sweats, weight loss, swollen lymph nodes, body aches, joint swelling, chest pain, shortness of breath, mood changes. POSITIVE muscle aches  Objective  Blood pressure 124/80, pulse 67, height 5\' 9"  (1.753 m), weight 182 lb (82.6 kg), SpO2 97%.   General: No apparent distress alert and oriented x3 mood and affect normal, dressed appropriately.  HEENT: Pupils equal, extraocular movements intact  Respiratory: Patient's speak in full sentences and does not appear short of breath  Cardiovascular: No lower extremity edema, non tender, no erythema  Gait MSK:  Back  does have loss of lordosis.  Tightness with straight leg test but no true radicular symptoms.  Tightness with Veldon German right greater than left.  Osteopathic findings  C2 flexed rotated and side bent right C6 flexed rotated and side bent left T3 extended rotated and side bent right inhaled rib T9 extended rotated and side bent left L2 flexed rotated and side bent right L3 flexed rotated and side bent left L4 flexed rotated and side bent right Sacrum right on right       Assessment and Plan:  SI (sacroiliac) joint dysfunction Discussed icing regimen and home exercises, which activities to do and which ones to avoid.  Increase activity slowly.  Follow-up again in 6 to 8 weeks has responded relatively well to the osteopathic manipulation.  Has had some stress recently that is likely contributing as well.    Nonallopathic problems  Decision today to treat with OMT was based on Physical Exam  After verbal consent patient was treated with HVLA, ME, FPR techniques in cervical, rib, thoracic, lumbar, and sacral  areas  Patient tolerated the procedure well with improvement in symptoms  Patient given exercises, stretches and lifestyle modifications  See medications in patient instructions if given  Patient will follow up in 4-8 weeks     The above documentation has been reviewed and is accurate and complete Shelby Louque M Sequoyah Ramone, DO         Note: This dictation was prepared with Dragon dictation along with smaller phrase technology. Any transcriptional errors that result from this  process are unintentional.

## 2024-02-06 ENCOUNTER — Encounter: Payer: Self-pay | Admitting: Family Medicine

## 2024-02-06 ENCOUNTER — Ambulatory Visit: Admitting: Family Medicine

## 2024-02-06 VITALS — BP 124/80 | HR 67 | Ht 69.0 in | Wt 182.0 lb

## 2024-02-06 DIAGNOSIS — M9903 Segmental and somatic dysfunction of lumbar region: Secondary | ICD-10-CM

## 2024-02-06 DIAGNOSIS — M9904 Segmental and somatic dysfunction of sacral region: Secondary | ICD-10-CM | POA: Diagnosis not present

## 2024-02-06 DIAGNOSIS — M9901 Segmental and somatic dysfunction of cervical region: Secondary | ICD-10-CM | POA: Diagnosis not present

## 2024-02-06 DIAGNOSIS — M9902 Segmental and somatic dysfunction of thoracic region: Secondary | ICD-10-CM | POA: Diagnosis not present

## 2024-02-06 DIAGNOSIS — M9908 Segmental and somatic dysfunction of rib cage: Secondary | ICD-10-CM | POA: Diagnosis not present

## 2024-02-06 DIAGNOSIS — M533 Sacrococcygeal disorders, not elsewhere classified: Secondary | ICD-10-CM | POA: Diagnosis not present

## 2024-02-06 NOTE — Assessment & Plan Note (Signed)
 Discussed icing regimen and home exercises, which activities to do and which ones to avoid.  Increase activity slowly.  Follow-up again in 6 to 8 weeks has responded relatively well to the osteopathic manipulation.  Has had some stress recently that is likely contributing as well.

## 2024-02-13 DIAGNOSIS — L814 Other melanin hyperpigmentation: Secondary | ICD-10-CM | POA: Diagnosis not present

## 2024-02-13 DIAGNOSIS — D225 Melanocytic nevi of trunk: Secondary | ICD-10-CM | POA: Diagnosis not present

## 2024-02-13 DIAGNOSIS — D2262 Melanocytic nevi of left upper limb, including shoulder: Secondary | ICD-10-CM | POA: Diagnosis not present

## 2024-02-13 DIAGNOSIS — D2261 Melanocytic nevi of right upper limb, including shoulder: Secondary | ICD-10-CM | POA: Diagnosis not present

## 2024-02-13 DIAGNOSIS — D1801 Hemangioma of skin and subcutaneous tissue: Secondary | ICD-10-CM | POA: Diagnosis not present

## 2024-02-13 DIAGNOSIS — D224 Melanocytic nevi of scalp and neck: Secondary | ICD-10-CM | POA: Diagnosis not present

## 2024-02-13 DIAGNOSIS — L570 Actinic keratosis: Secondary | ICD-10-CM | POA: Diagnosis not present

## 2024-02-20 ENCOUNTER — Encounter: Payer: Self-pay | Admitting: Family Medicine

## 2024-02-27 ENCOUNTER — Other Ambulatory Visit: Payer: Self-pay

## 2024-02-27 ENCOUNTER — Other Ambulatory Visit: Payer: Self-pay | Admitting: Family Medicine

## 2024-02-27 ENCOUNTER — Other Ambulatory Visit (HOSPITAL_COMMUNITY): Payer: Self-pay

## 2024-02-27 MED ORDER — VENLAFAXINE HCL ER 37.5 MG PO CP24
37.5000 mg | ORAL_CAPSULE | Freq: Every day | ORAL | 0 refills | Status: DC
  Filled 2024-02-27: qty 90, 90d supply, fill #0

## 2024-03-18 NOTE — Progress Notes (Signed)
 [name] [address] [place] [address] Phone: [phone] Subjective:   IBryan [name], am serving as a scribe for Dr. Ronnell Coins.  I'm seeing this patient by the request  of:  [name], MD  CC: Back and neck pain follow-up  [id number]  Shelby [name] is a 48 y.o. female coming in with complaint of back and neck pain. OMT 02/06/2024. Patient states doing well. No new symptoms. B12 injection requested.  Patient has had some stress recently.  Medications patient has been prescribed: Celebrex , Effexor   Taking:yes         Reviewed prior external information including notes and imaging from previsou exam, outside providers and external EMR if available.   As well as notes that were available from care everywhere and other healthcare systems.  Past medical history, social, surgical and family history all reviewed in electronic medical record.  No pertanent information unless stated regarding to the chief complaint.   Past Medical History:  Diagnosis Date   Anxiety    Arthritis    back   GERD (gastroesophageal reflux disease)    Hypertension    controlled on meds   Palpitations    ECho and monitor 2022 normal    No Known Allergies   Review of Systems:  No headache, visual changes, nausea, vomiting, diarrhea, constipation, dizziness, abdominal pain, skin rash, fevers, chills, night sweats, weight loss, swollen lymph nodes, body aches, joint swelling, chest pain, shortness of breath, mood changes. POSITIVE muscle aches  Objective  Blood pressure 124/88, pulse 64, height 5' 9 (1.753 m), weight 185 lb (83.9 kg), SpO2 98%.   General: No apparent distress alert and oriented x3 mood and affect normal, dressed appropriately.  HEENT: Pupils equal, extraocular movements intact  Respiratory: Patient's speak in full sentences and does not appear short of breath  Cardiovascular: No lower extremity edema, non tender, no erythema    MSK:  Back tightness still noted in the paraspinal musculature.  Nothing that seems to be out of proportion for her.  Some tightness in the flexion extension but no radicular symptoms or straight leg test noted today.  Osteopathic findings  C2 flexed rotated and side bent right C6 flexed rotated and side bent right T3 extended rotated and side bent right inhaled rib T7 extended rotated and side bent left L2 flexed rotated and side bent right Sacrum right on right     Assessment and Plan:  B12 deficiency Injection given today.  SI (sacroiliac) joint dysfunction Overall doing significantly better.  Discussed with patient to continue to work on the core strengthening.  Has done wonderful with her weight.  Has had an excessive amount of stress recently that I do think is also contributing to some of the tightness as well.  Discussed with patient that follow-up again in 6 to 8 weeks    Nonallopathic problems  Decision today to treat with OMT was based on Physical Exam  After verbal consent patient was treated with HVLA, ME, FPR techniques in cervical, rib, thoracic, lumbar, and sacral  areas  Patient tolerated the procedure well with improvement in symptoms  Patient given exercises, stretches and lifestyle modifications  See medications in patient instructions if given  Patient will follow up in 4-8 weeks    The above documentation has been reviewed and is accurate and complete Altheria Shadoan M Shivank Pinedo, DO          Note: This dictation was prepared with Dragon dictation along  with smaller phrase technology. Any transcriptional errors that result from this process are unintentional.

## 2024-03-19 ENCOUNTER — Ambulatory Visit: Admitting: Family Medicine

## 2024-03-19 ENCOUNTER — Encounter: Payer: Self-pay | Admitting: Family Medicine

## 2024-03-19 VITALS — BP 124/88 | HR 64 | Ht 69.0 in | Wt 185.0 lb

## 2024-03-19 DIAGNOSIS — M9902 Segmental and somatic dysfunction of thoracic region: Secondary | ICD-10-CM | POA: Diagnosis not present

## 2024-03-19 DIAGNOSIS — M9901 Segmental and somatic dysfunction of cervical region: Secondary | ICD-10-CM

## 2024-03-19 DIAGNOSIS — M533 Sacrococcygeal disorders, not elsewhere classified: Secondary | ICD-10-CM

## 2024-03-19 DIAGNOSIS — M9908 Segmental and somatic dysfunction of rib cage: Secondary | ICD-10-CM | POA: Diagnosis not present

## 2024-03-19 DIAGNOSIS — M9903 Segmental and somatic dysfunction of lumbar region: Secondary | ICD-10-CM | POA: Diagnosis not present

## 2024-03-19 DIAGNOSIS — E538 Deficiency of other specified B group vitamins: Secondary | ICD-10-CM | POA: Diagnosis not present

## 2024-03-19 DIAGNOSIS — M9904 Segmental and somatic dysfunction of sacral region: Secondary | ICD-10-CM | POA: Diagnosis not present

## 2024-03-19 MED ORDER — CYANOCOBALAMIN 1000 MCG/ML IJ SOLN
1000.0000 ug | Freq: Once | INTRAMUSCULAR | Status: AC
Start: 2024-03-19 — End: 2024-03-19
  Administered 2024-03-19: 1000 ug via INTRAMUSCULAR

## 2024-03-19 NOTE — Patient Instructions (Signed)
 B12 injection today. Follow up in 6 weeks.

## 2024-03-19 NOTE — Assessment & Plan Note (Signed)
 Overall doing significantly better.  Discussed with patient to continue to work on the core strengthening.  Has done wonderful with her weight.  Has had an excessive amount of stress recently that I do think is also contributing to some of the tightness as well.  Discussed with patient that follow-up again in 6 to 8 weeks

## 2024-03-19 NOTE — Assessment & Plan Note (Signed)
Injection given today. 

## 2024-04-30 NOTE — Progress Notes (Unsigned)
  [name] [address] [place] [address] Phone: [phone] Subjective:   Shelby [name], am serving as a scribe for Dr. Arthea Claudene.  I'm seeing this patient by the request  of:  Geofm Glade PARAS, MD  CC: Back and neck pain follow-up  YEP:Dlagzrupcz  Shelby [name] is a 48 y.o. female coming in with complaint of back and neck pain. OMT 03/19/2024. Patient states that she has been doing well since last visit.   Medications patient has been prescribed: Zanaflex , Celebrex , Effexor   Taking: Yes         Reviewed prior external information including notes and imaging from previsou exam, outside providers and external EMR if available.   As well as notes that were available from care everywhere and other healthcare systems.  Past medical history, social, surgical and family history all reviewed in electronic medical record.  No pertanent information unless stated regarding to the chief complaint.   Past Medical History:  Diagnosis Date   Anxiety    Arthritis    back   GERD (gastroesophageal reflux disease)    Hypertension    controlled on meds   Palpitations    ECho and monitor 2022 normal    No Known Allergies   Review of Systems:  No headache, visual changes, nausea, vomiting, diarrhea, constipation, dizziness, abdominal pain, skin rash, fevers, chills, night sweats, weight loss, swollen lymph nodes, body aches, joint swelling, chest pain, shortness of breath, mood changes. POSITIVE muscle aches  Objective  Blood pressure 130/82, pulse 66, height 5' 9 (1.753 m), SpO2 98%.   General: No apparent distress alert and oriented x3 mood and affect normal, dressed appropriately.  HEENT: Pupils equal, extraocular movements intact  Respiratory: Patient's speak in full sentences and does not appear short of breath  Cardiovascular: No lower extremity edema, non tender, no erythema  Gait relatively normal MSK:  Back does have some loss  lordosis noted.  Still tightness noted in the right sacroiliac joint.  Patient does have a posterior ilium noted today which is different than usual  Osteopathic findings  C2 flexed rotated and side bent right C6 flexed rotated and side bent left T3 extended rotated and side bent right inhaled rib T9 extended rotated and side bent left L2 flexed rotated and side bent right L3 flexed rotated and side bent left Sacrum right on right    Assessment and Plan:  SI (sacroiliac) joint dysfunction Seem to be more of the right sacroiliac joint today.  Tolerated osteopathic manipulation significantly beneficial today.  Patient is doing well and continuing to work on core strengthening.  Discussed posture and ergonomics.  No change in medications.  Follow-up again in 6 to 8 weeks    Nonallopathic problems  Decision today to treat with OMT was based on Physical Exam  After verbal consent patient was treated with HVLA, ME, FPR techniques in cervical, rib, thoracic, lumbar, and sacral  areas  Patient tolerated the procedure well with improvement in symptoms  Patient given exercises, stretches and lifestyle modifications  See medications in patient instructions if given  Patient will follow up in 4-8 weeks    The above documentation has been reviewed and is accurate and complete Shelby Mangione M Dahlila Pfahler, DO          Note: This dictation was prepared with Dragon dictation along with smaller phrase technology. Any transcriptional errors that result from this process are unintentional.

## 2024-05-01 ENCOUNTER — Encounter: Payer: Self-pay | Admitting: Family Medicine

## 2024-05-01 ENCOUNTER — Ambulatory Visit: Admitting: Family Medicine

## 2024-05-01 VITALS — BP 130/82 | HR 66 | Ht 69.0 in

## 2024-05-01 DIAGNOSIS — M533 Sacrococcygeal disorders, not elsewhere classified: Secondary | ICD-10-CM

## 2024-05-01 DIAGNOSIS — M9903 Segmental and somatic dysfunction of lumbar region: Secondary | ICD-10-CM

## 2024-05-01 DIAGNOSIS — M9904 Segmental and somatic dysfunction of sacral region: Secondary | ICD-10-CM | POA: Diagnosis not present

## 2024-05-01 DIAGNOSIS — M9902 Segmental and somatic dysfunction of thoracic region: Secondary | ICD-10-CM

## 2024-05-01 DIAGNOSIS — M9908 Segmental and somatic dysfunction of rib cage: Secondary | ICD-10-CM | POA: Diagnosis not present

## 2024-05-01 DIAGNOSIS — M9901 Segmental and somatic dysfunction of cervical region: Secondary | ICD-10-CM | POA: Diagnosis not present

## 2024-05-01 NOTE — Assessment & Plan Note (Signed)
 Seem to be more of the right sacroiliac joint today.  Tolerated osteopathic manipulation significantly beneficial today.  Patient is doing well and continuing to work on core strengthening.  Discussed posture and ergonomics.  No change in medications.  Follow-up again in 6 to 8 weeks

## 2024-06-11 NOTE — Progress Notes (Signed)
 [name] [address] [place] [address] Phone: [phone] Subjective:   Shelby [name], am serving as a scribe for Dr. Arthea Claudene.  I'm seeing this patient by the request  of:  Geofm Glade PARAS, MD  CC: Low back pain  YEP:Dlagzrupcz  Shelby [name] is a 48 y.o. female coming in with complaint of back and neck pain. OMT on 05/01/2024. Patient states doing well overall.  Has lost some weight and feels like that has made some improvement.  Medications patient has been prescribed:   Taking:         Reviewed prior external information including notes and imaging from previsou exam, outside providers and external EMR if available.   As well as notes that were available from care everywhere and other healthcare systems.  Past medical history, social, surgical and family history all reviewed in electronic medical record.  No pertanent information unless stated regarding to the chief complaint.   Past Medical History:  Diagnosis Date   Anxiety    Arthritis    back   GERD (gastroesophageal reflux disease)    Hypertension    controlled on meds   Palpitations    ECho and monitor 2022 normal    No Known Allergies   Review of Systems:  No headache, visual changes, nausea, vomiting, diarrhea, constipation, dizziness, abdominal pain, skin rash, fevers, chills, night sweats, weight loss, swollen lymph nodes, body aches, joint swelling, chest pain, shortness of breath, mood changes. POSITIVE muscle aches  Objective  Blood pressure 104/76, pulse 65, height 5' 9 (1.753 m), weight 180 lb (81.6 kg), SpO2 98%.   General: No apparent distress alert and oriented x3 mood and affect normal, dressed appropriately.  HEENT: Pupils equal, extraocular movements intact  Respiratory: Patient's speak in full sentences and does not appear short of breath  Cardiovascular: No lower extremity edema, non tender, no erythema  Gait normal MSK:  Back does  have some loss lordosis.  Tightness noted on the left piriformis and the left paraspinal musculature.  Osteopathic findings  C4 flexed rotated and side bent left T3 extended rotated and side bent right inhaled rib L1 flexed rotated and side bent right L3 flexed rotated and side bent left Sacrum right on right       Assessment and Plan:  Chronic back pain Much better at this point.  Discussed which activities to do and which ones to avoid.  Increase activity slowly.  Discussed icing regimen and home exercises.  Patient has done great and has had some weight loss.  We discussed stopping around 165 to 170 pounds.  Patient is going continue to be active.  Feels like inflammation is better with the medication.  Continue core strengthening.  Follow-up again in 6 to 8 weeks.    Nonallopathic problems  Decision today to treat with OMT was based on Physical Exam  After verbal consent patient was treated with HVLA, ME, FPR techniques in cervical, rib, thoracic, lumbar, and sacral  areas  Patient tolerated the procedure well with improvement in symptoms  Patient given exercises, stretches and lifestyle modifications  See medications in patient instructions if given  Patient will follow up in 4-8 weeks    The above documentation has been reviewed and is accurate and complete Deronte Solis M Atleigh Gruen, DO          Note: This dictation was prepared with Dragon dictation along with smaller phrase technology. Any transcriptional errors that result from this process  are unintentional.

## 2024-06-15 ENCOUNTER — Encounter: Payer: Self-pay | Admitting: Family Medicine

## 2024-06-15 ENCOUNTER — Ambulatory Visit (INDEPENDENT_AMBULATORY_CARE_PROVIDER_SITE_OTHER): Admitting: Family Medicine

## 2024-06-15 VITALS — BP 104/76 | HR 65 | Ht 69.0 in | Wt 180.0 lb

## 2024-06-15 DIAGNOSIS — M9901 Segmental and somatic dysfunction of cervical region: Secondary | ICD-10-CM | POA: Diagnosis not present

## 2024-06-15 DIAGNOSIS — M9908 Segmental and somatic dysfunction of rib cage: Secondary | ICD-10-CM

## 2024-06-15 DIAGNOSIS — M9904 Segmental and somatic dysfunction of sacral region: Secondary | ICD-10-CM | POA: Diagnosis not present

## 2024-06-15 DIAGNOSIS — M9902 Segmental and somatic dysfunction of thoracic region: Secondary | ICD-10-CM

## 2024-06-15 DIAGNOSIS — G8929 Other chronic pain: Secondary | ICD-10-CM

## 2024-06-15 DIAGNOSIS — M9903 Segmental and somatic dysfunction of lumbar region: Secondary | ICD-10-CM | POA: Diagnosis not present

## 2024-06-15 DIAGNOSIS — M545 Low back pain, unspecified: Secondary | ICD-10-CM

## 2024-06-15 NOTE — Assessment & Plan Note (Signed)
 Much better at this point.  Discussed which activities to do and which ones to avoid.  Increase activity slowly.  Discussed icing regimen and home exercises.  Patient has done great and has had some weight loss.  We discussed stopping around 165 to 170 pounds.  Patient is going continue to be active.  Feels like inflammation is better with the medication.  Continue core strengthening.  Follow-up again in 6 to 8 weeks.

## 2024-06-17 ENCOUNTER — Other Ambulatory Visit: Payer: Self-pay | Admitting: Family Medicine

## 2024-06-17 ENCOUNTER — Other Ambulatory Visit (HOSPITAL_COMMUNITY): Payer: Self-pay

## 2024-06-17 MED ORDER — TIZANIDINE HCL 4 MG PO TABS
4.0000 mg | ORAL_TABLET | Freq: Every evening | ORAL | 1 refills | Status: AC
  Filled 2024-06-17: qty 90, 90d supply, fill #0
  Filled 2024-10-04: qty 90, 90d supply, fill #1

## 2024-07-23 NOTE — Progress Notes (Signed)
  [name] [address] [place] [address] Phone: [phone] Subjective:    I'm seeing this patient by the request  of:  Geofm Glade PARAS, MD  CC: Back and neck pain follow-up  YEP:Dlagzrupcz  Shelby [name] is a 48 y.o. female coming in with complaint of back and neck pain. OMT on 06/15/2024. Patient states overall continues to have some very mild discomfort more in the neck and lower back.  Since the weight loss has lost more weight and because of that is not having any lower back pain.  First time in many years patient has been able to save it.  Medications patient has been prescribed: zanaflex   Taking:         Reviewed prior external information including notes and imaging from previsou exam, outside providers and external EMR if available.   As well as notes that were available from care everywhere and other healthcare systems.  Past medical history, social, surgical and family history all reviewed in electronic medical record.  No pertanent information unless stated regarding to the chief complaint.   Past Medical History:  Diagnosis Date   Anxiety    Arthritis    back   GERD (gastroesophageal reflux disease)    Hypertension    controlled on meds   Palpitations    ECho and monitor 2022 normal    No Known Allergies   Review of Systems:  No headache, visual changes, nausea, vomiting, diarrhea, constipation, dizziness, abdominal pain, skin rash, fevers, chills, night sweats, weight loss, swollen lymph nodes, body aches, joint swelling, chest pain, shortness of breath, mood changes. POSITIVE muscle aches  Objective  There were no vitals taken for this visit.   General: No apparent distress alert and oriented x3 mood and affect normal, dressed appropriately.  HEENT: Pupils equal, extraocular movements intact  Respiratory: Patient's speak in full sentences and does not appear short of breath  Cardiovascular: No lower extremity  edema, non tender, no erythema  Gait MSK:  Back does have some mild loss of lordosis.  Patient has negative straight leg test noted.  Some mild discomfort with sidebending.  Osteopathic findings  C2 flexed rotated and side bent right C6 flexed rotated and side bent left T3 extended rotated and side bent right inhaled rib T9 extended rotated and side bent left L2 flexed rotated and side bent right L3 flexed rotated sidebent left Sacrum right on right       Assessment and Plan:  No problem-specific Assessment & Plan notes found for this encounter.    Nonallopathic problems  Decision today to treat with OMT was based on Physical Exam  After verbal consent patient was treated with HVLA, ME, FPR techniques in cervical, rib, thoracic, lumbar, and sacral  areas  Patient tolerated the procedure well with improvement in symptoms  Patient given exercises, stretches and lifestyle modifications  See medications in patient instructions if given  Patient will follow up in 4-8 weeks    The above documentation has been reviewed and is accurate and complete Shelby [name] M Shelby Vandruff, DO          Note: This dictation was prepared with Dragon dictation along with smaller phrase technology. Any transcriptional errors that result from this process are unintentional.

## 2024-07-27 ENCOUNTER — Encounter: Payer: Self-pay | Admitting: Family Medicine

## 2024-07-27 ENCOUNTER — Ambulatory Visit: Admitting: Family Medicine

## 2024-07-27 VITALS — BP 122/78 | HR 70 | Ht 69.0 in | Wt 164.0 lb

## 2024-07-27 DIAGNOSIS — M5416 Radiculopathy, lumbar region: Secondary | ICD-10-CM | POA: Diagnosis not present

## 2024-07-27 DIAGNOSIS — M9903 Segmental and somatic dysfunction of lumbar region: Secondary | ICD-10-CM

## 2024-07-27 DIAGNOSIS — M9901 Segmental and somatic dysfunction of cervical region: Secondary | ICD-10-CM

## 2024-07-27 DIAGNOSIS — M9904 Segmental and somatic dysfunction of sacral region: Secondary | ICD-10-CM | POA: Diagnosis not present

## 2024-07-27 DIAGNOSIS — M9902 Segmental and somatic dysfunction of thoracic region: Secondary | ICD-10-CM | POA: Diagnosis not present

## 2024-07-27 DIAGNOSIS — M9908 Segmental and somatic dysfunction of rib cage: Secondary | ICD-10-CM

## 2024-07-27 DIAGNOSIS — E538 Deficiency of other specified B group vitamins: Secondary | ICD-10-CM

## 2024-07-27 MED ORDER — CYANOCOBALAMIN 1000 MCG/ML IJ SOLN
1000.0000 ug | Freq: Once | INTRAMUSCULAR | Status: AC
Start: 2024-07-27 — End: 2024-07-27
  Administered 2024-07-27: 1000 ug via INTRAMUSCULAR

## 2024-07-27 NOTE — Patient Instructions (Addendum)
 Good to see you! B12 injection today. See you again in 2 months Write Geofm

## 2024-07-27 NOTE — Assessment & Plan Note (Signed)
 B12 injection given today discussed how the weight loss has been significantly beneficial.  Discussed with her to talk to her primary care to see if she could get the Zepbound from her so she does not need to go to this other facility.

## 2024-07-27 NOTE — Assessment & Plan Note (Signed)
 Patient is unremarkably well with weight loss and no longer having any significant back pain.  May be able to even push this out in the long-term.  Decreasing times we would need to see patient.  Discussed icing regimen and home exercises, discussed with patient to continue to be active.  Follow-up again 6 to 8 weeks.

## 2024-08-03 ENCOUNTER — Other Ambulatory Visit: Payer: Self-pay | Admitting: Family Medicine

## 2024-08-04 ENCOUNTER — Other Ambulatory Visit (HOSPITAL_COMMUNITY): Payer: Self-pay

## 2024-08-04 MED ORDER — VENLAFAXINE HCL ER 37.5 MG PO CP24
37.5000 mg | ORAL_CAPSULE | Freq: Every day | ORAL | 0 refills | Status: DC
  Filled 2024-08-04: qty 90, 90d supply, fill #0

## 2024-08-14 ENCOUNTER — Encounter: Payer: Self-pay | Admitting: Internal Medicine

## 2024-09-11 ENCOUNTER — Telehealth: Admitting: Family Medicine

## 2024-09-11 ENCOUNTER — Other Ambulatory Visit (HOSPITAL_COMMUNITY): Payer: Self-pay

## 2024-09-11 DIAGNOSIS — J029 Acute pharyngitis, unspecified: Secondary | ICD-10-CM

## 2024-09-11 DIAGNOSIS — J019 Acute sinusitis, unspecified: Secondary | ICD-10-CM

## 2024-09-11 DIAGNOSIS — B9689 Other specified bacterial agents as the cause of diseases classified elsewhere: Secondary | ICD-10-CM | POA: Diagnosis not present

## 2024-09-11 MED ORDER — AMOXICILLIN-POT CLAVULANATE 875-125 MG PO TABS
1.0000 | ORAL_TABLET | Freq: Two times a day (BID) | ORAL | 0 refills | Status: AC
  Filled 2024-09-11: qty 20, 10d supply, fill #0

## 2024-09-11 MED ORDER — LIDOCAINE VISCOUS HCL 2 % MT SOLN
15.0000 mL | OROMUCOSAL | 0 refills | Status: AC | PRN
  Filled 2024-09-11: qty 200, 10d supply, fill #0

## 2024-09-11 NOTE — Progress Notes (Signed)
 Virtual Visit Consent   Shelby [name], you are scheduled for a virtual visit with a [facility] provider today. Just as with appointments in the office, your consent must be obtained to participate. Your consent will be active for this visit and any virtual visit you may have with one of our providers in the next 365 days. If you have a MyChart account, a copy of this consent can be sent to you electronically.  As this is a virtual visit, video technology does not allow for your provider to perform a traditional examination. This may limit your provider's ability to fully assess your condition. If your provider identifies any concerns that need to be evaluated in person or the need to arrange testing (such as labs, EKG, etc.), we will make arrangements to do so. Although advances in technology are sophisticated, we cannot ensure that it will always work on either your end or our end. If the connection with a video visit is poor, the visit may have to be switched to a telephone visit. With either a video or telephone visit, we are not always able to ensure that we have a secure connection.  By engaging in this virtual visit, you consent to the provision of healthcare and authorize for your insurance to be billed (if applicable) for the services provided during this visit. Depending on your insurance coverage, you may receive a charge related to this service.  I need to obtain your verbal consent now. Are you willing to proceed with your visit today? Shelby [name] has provided verbal consent on 09/11/2024 for a virtual visit (video or telephone). Loa Lamp, FNP  Date: 09/11/2024 11:07 AM   Virtual Visit via Video Note   I, Loa Lamp, connected with  Shelby [name]  ([id number][birth year]) on 09/11/24 at 11:00 AM EST by a video-enabled telemedicine application and verified that I am speaking with the correct person using two identifiers.  Location: Patient: Virtual Visit Location Patient:  Home Provider: Virtual Visit Location Provider: Home Office   I discussed the limitations of evaluation and management by telemedicine and the availability of in person appointments. The patient expressed understanding and agreed to proceed.    History of Present Illness: Shelby [name] is a 48 y.o. who identifies as a female who was assigned female at birth, and is being seen today for so throat, sinus pressure, neg covid and flu testing. Rt sinus is worse. RT ear and rt throat pain. No fever. Headache. 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/04/2015   Nonallopathic lesion of lumbosacral region 06/15/2015   Nonallopathic lesion of sacral region 06/15/2015   Nonallopathic lesion of thoracic region 06/15/2015   Acute lumbar radiculopathy 03/28/2015   Anxiety 06/16/2012    Allergies: No Known Allergies Medications:  Current Outpatient Medications:    amoxicillin -clavulanate (AUGMENTIN ) 875-125 MG tablet, Take 1 tablet by mouth 2 (two) times daily for 10 days., Disp: 20 tablet, Rfl: 0   lidocaine  (XYLOCAINE ) 2 % solution, Use as directed 15 mLs in the mouth or throat as needed for up to  10 days for mouth pain., Disp: 200 mL, Rfl: 0   celecoxib  (CELEBREX ) 100 MG capsule, Take 1 capsule (100 mg total) by mouth 2 (two) times daily., Disp: 180 capsule, Rfl: 3   hydroquinone  4 % cream, Apply a small amount topically to affected area once daily., Disp: 28.35 g, Rfl: 2   ondansetron  (ZOFRAN -ODT) 4 MG disintegrating tablet, , Disp: , Rfl:    Ruxolitinib  Phosphate (OPZELURA ) 1.5 % CREA, Apply a  small amount to affected area twice a day to active eczema on face, Disp: 60 g, Rfl: 2   tiZANidine  (ZANAFLEX ) 4 MG tablet, Take 1 tablet (4 mg total) by mouth nightly, Disp: 90 tablet, Rfl: 1   tretinoin  (RETIN-A ) 0.025 % cream, Apply a pea sized amount topically to face nightly, Disp: 45 g, Rfl: 3   venlafaxine  XR (EFFEXOR -XR) 37.5 MG 24 hr capsule, Take 1 capsule (37.5 mg total) by mouth daily with breakfast., Disp: 90 capsule, Rfl: 0  Observations/Objective: Patient is well-developed, well-nourished in no acute distress.  Resting comfortably  at home.  Head is normocephalic, atraumatic.  No labored breathing.  Speech is clear and coherent with logical content.  Patient is alert and oriented at baseline.    Assessment and Plan: 1. Pharyngitis, unspecified etiology (Primary)  2. Acute bacterial sinusitis   Increase fluids, warm salt water gargles, ibuprofen as directed, UC as needed  Follow Up Instructions: I discussed the assessment and treatment plan with the patient. The patient was provided an opportunity to ask questions and all were answered. The patient agreed with the plan and demonstrated an understanding of the instructions.  A copy of instructions were sent to the patient via MyChart unless otherwise noted below.     The patient was advised to call back or seek an in-person evaluation if the symptoms worsen or if the condition fails to improve as anticipated.    Mechell Girgis, FNP

## 2024-09-11 NOTE — Patient Instructions (Signed)

## 2024-09-22 ENCOUNTER — Encounter: Payer: Self-pay | Admitting: Family Medicine

## 2024-09-22 NOTE — Telephone Encounter (Signed)
 Added to wait list.

## 2024-09-22 NOTE — Progress Notes (Deleted)
°  [name] [address] [place] [address] Phone: [phone] Subjective:    I'm seeing this patient by the request  of:  Geofm Glade PARAS, MD  CC:   YEP:Dlagzrupcz  Shelby [name] is a 48 y.o. female coming in with complaint of back and neck pain. OMT 07/27/2024. Patient states   Medications patient has been prescribed: Zanaflex , Effexor   Taking:         Reviewed prior external information including notes and imaging from previsou exam, outside providers and external EMR if available.   As well as notes that were available from care everywhere and other healthcare systems.  Past medical history, social, surgical and family history all reviewed in electronic medical record.  No pertanent information unless stated regarding to the chief complaint.   Past Medical History:  Diagnosis Date   Anxiety    Arthritis    back   GERD (gastroesophageal reflux disease)    Hypertension    controlled on meds   Palpitations    ECho and monitor 2022 normal    Allergies[1]   Review of Systems:  No headache, visual changes, nausea, vomiting, diarrhea, constipation, dizziness, abdominal pain, skin rash, fevers, chills, night sweats, weight loss, swollen lymph nodes, body aches, joint swelling, chest pain, shortness of breath, mood changes. POSITIVE muscle aches  Objective  There were no vitals taken for this visit.   General: No apparent distress alert and oriented x3 mood and affect normal, dressed appropriately.  HEENT: Pupils equal, extraocular movements intact  Respiratory: Patient's speak in full sentences and does not appear short of breath  Cardiovascular: No lower extremity edema, non tender, no erythema  Gait MSK:  Back   Osteopathic findings  C2 flexed rotated and side bent right C6 flexed rotated and side bent left T3 extended rotated and side bent right inhaled rib T9 extended rotated and side bent left L2 flexed rotated and  side bent right Sacrum right on right       Assessment and Plan:  No problem-specific Assessment & Plan notes found for this encounter.    Nonallopathic problems  Decision today to treat with OMT was based on Physical Exam  After verbal consent patient was treated with HVLA, ME, FPR techniques in cervical, rib, thoracic, lumbar, and sacral  areas  Patient tolerated the procedure well with improvement in symptoms  Patient given exercises, stretches and lifestyle modifications  See medications in patient instructions if given  Patient will follow up in 4-8 weeks             Note: This dictation was prepared with Dragon dictation along with smaller phrase technology. Any transcriptional errors that result from this process are unintentional.            [1] No Known Allergies

## 2024-09-23 ENCOUNTER — Ambulatory Visit: Admitting: Family Medicine

## 2024-10-04 ENCOUNTER — Other Ambulatory Visit: Payer: Self-pay | Admitting: Family Medicine

## 2024-10-05 ENCOUNTER — Other Ambulatory Visit: Payer: Self-pay

## 2024-10-05 ENCOUNTER — Other Ambulatory Visit (HOSPITAL_COMMUNITY): Payer: Self-pay

## 2024-10-05 MED ORDER — VENLAFAXINE HCL ER 37.5 MG PO CP24
37.5000 mg | ORAL_CAPSULE | Freq: Every day | ORAL | 0 refills | Status: AC
  Filled 2024-10-05: qty 90, 90d supply, fill #0

## 2024-10-12 ENCOUNTER — Encounter: Payer: Self-pay | Admitting: Internal Medicine

## 2024-10-12 ENCOUNTER — Other Ambulatory Visit (HOSPITAL_COMMUNITY): Payer: Self-pay

## 2024-10-13 NOTE — Progress Notes (Unsigned)
 " [name] [address] [place] [address] Phone: [phone] Subjective:   Shelby [name], am serving as a scribe for Dr. Arthea [name].  I'm seeing this patient by the request  of:  Shelby Glade PARAS, MD  CC: Low back pain follow-up  YEP:Dlagzrupcz  Shelby [name] is a 49 y.o. female coming in with complaint of back and neck pain. OMT on 07/27/2024. Patient states that she is here for a maintenance appointment.  Has been doing relatively well.  Has worked on her weight loss and is feeling good.  Medications patient has been prescribed: Effexor  zanaflex   Taking:         Reviewed prior external information including notes and imaging from previsou exam, outside providers and external EMR if available.   As well as notes that were available from care everywhere and other healthcare systems.  Past medical history, social, surgical and family history all reviewed in electronic medical record.  No pertanent information unless stated regarding to the chief complaint.   Past Medical History:  Diagnosis Date   Anxiety    Arthritis    back   GERD (gastroesophageal reflux disease)    Hypertension    controlled on meds   Palpitations    ECho and monitor 2022 normal    Allergies[1]   Review of Systems:  No headache, visual changes, nausea, vomiting, diarrhea, constipation, dizziness, abdominal pain, skin rash, fevers, chills, night sweats, weight loss, swollen lymph nodes, body aches, joint swelling, chest pain, shortness of breath, mood changes. POSITIVE muscle aches  Objective  Blood pressure 112/76, pulse 66, height 5' 9 (1.753 m), weight 158 lb (71.7 kg), SpO2 98%.   General: No apparent distress alert and oriented x3 mood and affect normal, dressed appropriately.  HEENT: Pupils equal, extraocular movements intact  Respiratory: Patient's speak in full sentences and does not appear short of breath  Cardiovascular: No lower extremity  edema, non tender, no erythema  Gait relatively normal MSK:  Back does have some mild loss lordosis.  Some tenderness to palpation in the paraspinal musculature.  Osteopathic findings  C2 flexed rotated and side bent right C6 flexed rotated and side bent left C7 flexed rotated and side bent right T3 extended rotated and side bent right inhaled rib T9 extended rotated and side bent left L1 flexed rotated and side bent right L5 flexed rotated and side bent left Sacrum right on right     Assessment and Plan:  SI (sacroiliac) joint dysfunction Patient is doing remarkably well overall.  Mild tightness here and there but nothing that is stopping her from activity.  Will being very active.  Has done wonderful on her weight loss journey.  Follow-up again in 6 to 8 weeks    Nonallopathic problems  Decision today to treat with OMT was based on Physical Exam  After verbal consent patient was treated with HVLA, ME, FPR techniques in cervical, rib, thoracic, lumbar, and sacral  areas  Patient tolerated the procedure well with improvement in symptoms  Patient given exercises, stretches and lifestyle modifications  See medications in patient instructions if given  Patient will follow up in 4-8 weeks      The above documentation has been reviewed and is accurate and complete Shelby [name] M Shelby Jian, DO        Note: This dictation was prepared with Dragon dictation along with smaller phrase technology. Any transcriptional errors that result from this process are unintentional.            [  1] No Known Allergies  "

## 2024-10-14 ENCOUNTER — Encounter: Payer: Self-pay | Admitting: Family Medicine

## 2024-10-14 ENCOUNTER — Ambulatory Visit: Payer: Self-pay | Admitting: Family Medicine

## 2024-10-14 ENCOUNTER — Other Ambulatory Visit: Payer: Self-pay

## 2024-10-14 VITALS — BP 112/76 | HR 66 | Ht 69.0 in | Wt 158.0 lb

## 2024-10-14 DIAGNOSIS — M533 Sacrococcygeal disorders, not elsewhere classified: Secondary | ICD-10-CM | POA: Diagnosis not present

## 2024-10-14 DIAGNOSIS — M9902 Segmental and somatic dysfunction of thoracic region: Secondary | ICD-10-CM

## 2024-10-14 DIAGNOSIS — M9904 Segmental and somatic dysfunction of sacral region: Secondary | ICD-10-CM | POA: Diagnosis not present

## 2024-10-14 DIAGNOSIS — M9908 Segmental and somatic dysfunction of rib cage: Secondary | ICD-10-CM | POA: Diagnosis not present

## 2024-10-14 DIAGNOSIS — M9901 Segmental and somatic dysfunction of cervical region: Secondary | ICD-10-CM

## 2024-10-14 DIAGNOSIS — M9903 Segmental and somatic dysfunction of lumbar region: Secondary | ICD-10-CM

## 2024-10-14 MED ORDER — ZEPBOUND 5 MG/0.5ML ~~LOC~~ SOLN: 5.0000 mg | SUBCUTANEOUS | 0 refills | Status: AC

## 2024-10-14 NOTE — Assessment & Plan Note (Signed)
 Patient is doing remarkably well overall.  Mild tightness here and there but nothing that is stopping her from activity.  Will being very active.  Has done wonderful on her weight loss journey.  Follow-up again in 6 to 8 weeks

## 2024-11-26 ENCOUNTER — Ambulatory Visit: Admitting: Family Medicine

## 2024-12-02 ENCOUNTER — Encounter: Admitting: Internal Medicine

## 2025-03-08 ENCOUNTER — Encounter: Admitting: Internal Medicine
